# Patient Record
Sex: Female | Born: 1935
Health system: Southern US, Community
[De-identification: ages and names within clinical notes are randomized; demographics above are authoritative.]

## PROBLEM LIST (undated history)

## (undated) DIAGNOSIS — C801 Malignant (primary) neoplasm, unspecified: Secondary | ICD-10-CM

## (undated) DIAGNOSIS — R609 Edema, unspecified: Secondary | ICD-10-CM

## (undated) DIAGNOSIS — H269 Unspecified cataract: Secondary | ICD-10-CM

## (undated) DIAGNOSIS — Z91038 Other insect allergy status: Secondary | ICD-10-CM

## (undated) DIAGNOSIS — Z91030 Bee allergy status: Secondary | ICD-10-CM

## (undated) DIAGNOSIS — K219 Gastro-esophageal reflux disease without esophagitis: Secondary | ICD-10-CM

## (undated) HISTORY — DX: Bee allergy status: Z91.030

## (undated) HISTORY — PX: TONSILLECTOMY: SUR1361

## (undated) HISTORY — DX: Unspecified cataract: H26.9

## (undated) HISTORY — DX: Gastro-esophageal reflux disease without esophagitis: K21.9

## (undated) HISTORY — DX: Malignant (primary) neoplasm, unspecified: C80.1

## (undated) HISTORY — PX: EYE SURGERY: SHX253

## (undated) HISTORY — PX: APPENDECTOMY: SHX54

## (undated) HISTORY — DX: Other insect allergy status: Z91.038

## (undated) HISTORY — PX: ABDOMINAL HYSTERECTOMY: SHX81

## (undated) HISTORY — PX: NEPHROLITHOTOMY: SHX5134

## (undated) HISTORY — DX: Edema, unspecified: R60.9

## (undated) HISTORY — PX: ADENOIDECTOMY: SUR15

## (undated) HISTORY — PX: CHOLECYSTECTOMY: SHX55

---

## 2011-03-10 DIAGNOSIS — D649 Anemia, unspecified: Secondary | ICD-10-CM | POA: Diagnosis not present

## 2011-08-03 DIAGNOSIS — H43399 Other vitreous opacities, unspecified eye: Secondary | ICD-10-CM | POA: Diagnosis not present

## 2011-08-03 DIAGNOSIS — H524 Presbyopia: Secondary | ICD-10-CM | POA: Diagnosis not present

## 2011-08-03 DIAGNOSIS — H5200 Hypermetropia, unspecified eye: Secondary | ICD-10-CM | POA: Diagnosis not present

## 2011-08-03 DIAGNOSIS — H52229 Regular astigmatism, unspecified eye: Secondary | ICD-10-CM | POA: Diagnosis not present

## 2011-09-09 DIAGNOSIS — R0609 Other forms of dyspnea: Secondary | ICD-10-CM | POA: Diagnosis not present

## 2011-09-09 DIAGNOSIS — R5381 Other malaise: Secondary | ICD-10-CM | POA: Diagnosis not present

## 2011-09-09 DIAGNOSIS — R0989 Other specified symptoms and signs involving the circulatory and respiratory systems: Secondary | ICD-10-CM | POA: Diagnosis not present

## 2011-09-20 DIAGNOSIS — K5730 Diverticulosis of large intestine without perforation or abscess without bleeding: Secondary | ICD-10-CM | POA: Diagnosis not present

## 2011-09-20 DIAGNOSIS — D500 Iron deficiency anemia secondary to blood loss (chronic): Secondary | ICD-10-CM | POA: Diagnosis not present

## 2011-12-03 DIAGNOSIS — D649 Anemia, unspecified: Secondary | ICD-10-CM | POA: Diagnosis not present

## 2011-12-03 DIAGNOSIS — T7840XA Allergy, unspecified, initial encounter: Secondary | ICD-10-CM | POA: Diagnosis not present

## 2012-01-04 DIAGNOSIS — Z1231 Encounter for screening mammogram for malignant neoplasm of breast: Secondary | ICD-10-CM | POA: Diagnosis not present

## 2012-06-30 DIAGNOSIS — R609 Edema, unspecified: Secondary | ICD-10-CM | POA: Diagnosis not present

## 2012-06-30 DIAGNOSIS — R0609 Other forms of dyspnea: Secondary | ICD-10-CM | POA: Diagnosis not present

## 2012-06-30 DIAGNOSIS — Z79899 Other long term (current) drug therapy: Secondary | ICD-10-CM | POA: Diagnosis not present

## 2012-06-30 DIAGNOSIS — R5381 Other malaise: Secondary | ICD-10-CM | POA: Diagnosis not present

## 2012-06-30 DIAGNOSIS — R5383 Other fatigue: Secondary | ICD-10-CM | POA: Diagnosis not present

## 2012-06-30 DIAGNOSIS — R0602 Shortness of breath: Secondary | ICD-10-CM | POA: Diagnosis not present

## 2012-07-05 DIAGNOSIS — R918 Other nonspecific abnormal finding of lung field: Secondary | ICD-10-CM | POA: Diagnosis not present

## 2012-07-05 DIAGNOSIS — K449 Diaphragmatic hernia without obstruction or gangrene: Secondary | ICD-10-CM | POA: Diagnosis not present

## 2012-07-05 DIAGNOSIS — I77819 Aortic ectasia, unspecified site: Secondary | ICD-10-CM | POA: Diagnosis not present

## 2012-07-10 DIAGNOSIS — E7800 Pure hypercholesterolemia, unspecified: Secondary | ICD-10-CM | POA: Diagnosis not present

## 2012-07-10 DIAGNOSIS — E559 Vitamin D deficiency, unspecified: Secondary | ICD-10-CM | POA: Diagnosis not present

## 2012-07-10 DIAGNOSIS — D510 Vitamin B12 deficiency anemia due to intrinsic factor deficiency: Secondary | ICD-10-CM | POA: Diagnosis not present

## 2012-07-10 DIAGNOSIS — M949 Disorder of cartilage, unspecified: Secondary | ICD-10-CM | POA: Diagnosis not present

## 2012-07-10 DIAGNOSIS — D509 Iron deficiency anemia, unspecified: Secondary | ICD-10-CM | POA: Diagnosis not present

## 2012-07-10 DIAGNOSIS — M899 Disorder of bone, unspecified: Secondary | ICD-10-CM | POA: Diagnosis not present

## 2012-07-12 DIAGNOSIS — R1310 Dysphagia, unspecified: Secondary | ICD-10-CM | POA: Diagnosis not present

## 2012-07-13 DIAGNOSIS — D500 Iron deficiency anemia secondary to blood loss (chronic): Secondary | ICD-10-CM | POA: Diagnosis not present

## 2012-07-13 DIAGNOSIS — D649 Anemia, unspecified: Secondary | ICD-10-CM | POA: Diagnosis not present

## 2012-07-13 DIAGNOSIS — K222 Esophageal obstruction: Secondary | ICD-10-CM | POA: Diagnosis not present

## 2012-07-13 DIAGNOSIS — K219 Gastro-esophageal reflux disease without esophagitis: Secondary | ICD-10-CM | POA: Diagnosis not present

## 2012-07-13 DIAGNOSIS — K449 Diaphragmatic hernia without obstruction or gangrene: Secondary | ICD-10-CM | POA: Diagnosis not present

## 2012-07-13 DIAGNOSIS — K254 Chronic or unspecified gastric ulcer with hemorrhage: Secondary | ICD-10-CM | POA: Diagnosis not present

## 2012-07-18 DIAGNOSIS — D510 Vitamin B12 deficiency anemia due to intrinsic factor deficiency: Secondary | ICD-10-CM | POA: Diagnosis not present

## 2012-07-21 DIAGNOSIS — D649 Anemia, unspecified: Secondary | ICD-10-CM | POA: Diagnosis not present

## 2012-07-28 DIAGNOSIS — R0609 Other forms of dyspnea: Secondary | ICD-10-CM | POA: Diagnosis not present

## 2012-07-28 DIAGNOSIS — K279 Peptic ulcer, site unspecified, unspecified as acute or chronic, without hemorrhage or perforation: Secondary | ICD-10-CM | POA: Diagnosis not present

## 2012-07-28 DIAGNOSIS — R0989 Other specified symptoms and signs involving the circulatory and respiratory systems: Secondary | ICD-10-CM | POA: Diagnosis not present

## 2012-08-04 DIAGNOSIS — D649 Anemia, unspecified: Secondary | ICD-10-CM | POA: Diagnosis not present

## 2012-08-14 DIAGNOSIS — D510 Vitamin B12 deficiency anemia due to intrinsic factor deficiency: Secondary | ICD-10-CM | POA: Diagnosis not present

## 2012-08-14 DIAGNOSIS — R1310 Dysphagia, unspecified: Secondary | ICD-10-CM | POA: Diagnosis not present

## 2012-10-06 DIAGNOSIS — D649 Anemia, unspecified: Secondary | ICD-10-CM | POA: Diagnosis not present

## 2012-10-26 DIAGNOSIS — K219 Gastro-esophageal reflux disease without esophagitis: Secondary | ICD-10-CM | POA: Diagnosis not present

## 2012-10-26 DIAGNOSIS — D500 Iron deficiency anemia secondary to blood loss (chronic): Secondary | ICD-10-CM | POA: Diagnosis not present

## 2012-10-26 DIAGNOSIS — R1310 Dysphagia, unspecified: Secondary | ICD-10-CM | POA: Diagnosis not present

## 2012-10-26 DIAGNOSIS — K222 Esophageal obstruction: Secondary | ICD-10-CM | POA: Diagnosis not present

## 2012-10-26 DIAGNOSIS — D649 Anemia, unspecified: Secondary | ICD-10-CM | POA: Diagnosis not present

## 2012-11-10 DIAGNOSIS — D1330 Benign neoplasm of unspecified part of small intestine: Secondary | ICD-10-CM | POA: Diagnosis not present

## 2012-11-10 DIAGNOSIS — K449 Diaphragmatic hernia without obstruction or gangrene: Secondary | ICD-10-CM | POA: Diagnosis not present

## 2012-11-10 DIAGNOSIS — K253 Acute gastric ulcer without hemorrhage or perforation: Secondary | ICD-10-CM | POA: Diagnosis not present

## 2012-11-10 DIAGNOSIS — K250 Acute gastric ulcer with hemorrhage: Secondary | ICD-10-CM | POA: Diagnosis not present

## 2012-11-10 DIAGNOSIS — K222 Esophageal obstruction: Secondary | ICD-10-CM | POA: Diagnosis not present

## 2012-11-10 DIAGNOSIS — D509 Iron deficiency anemia, unspecified: Secondary | ICD-10-CM | POA: Diagnosis not present

## 2012-11-10 DIAGNOSIS — D508 Other iron deficiency anemias: Secondary | ICD-10-CM | POA: Diagnosis not present

## 2012-11-17 DIAGNOSIS — D649 Anemia, unspecified: Secondary | ICD-10-CM | POA: Diagnosis not present

## 2012-11-28 DIAGNOSIS — D508 Other iron deficiency anemias: Secondary | ICD-10-CM | POA: Diagnosis not present

## 2012-12-07 DIAGNOSIS — D649 Anemia, unspecified: Secondary | ICD-10-CM | POA: Diagnosis not present

## 2012-12-07 DIAGNOSIS — D500 Iron deficiency anemia secondary to blood loss (chronic): Secondary | ICD-10-CM | POA: Diagnosis not present

## 2012-12-14 DIAGNOSIS — D649 Anemia, unspecified: Secondary | ICD-10-CM | POA: Diagnosis not present

## 2012-12-26 DIAGNOSIS — D500 Iron deficiency anemia secondary to blood loss (chronic): Secondary | ICD-10-CM | POA: Diagnosis not present

## 2012-12-26 DIAGNOSIS — D649 Anemia, unspecified: Secondary | ICD-10-CM | POA: Diagnosis not present

## 2013-01-12 DIAGNOSIS — Z0000 Encounter for general adult medical examination without abnormal findings: Secondary | ICD-10-CM | POA: Diagnosis not present

## 2013-01-12 DIAGNOSIS — Z23 Encounter for immunization: Secondary | ICD-10-CM | POA: Diagnosis not present

## 2013-01-30 DIAGNOSIS — D649 Anemia, unspecified: Secondary | ICD-10-CM | POA: Diagnosis not present

## 2013-01-30 DIAGNOSIS — D500 Iron deficiency anemia secondary to blood loss (chronic): Secondary | ICD-10-CM | POA: Diagnosis not present

## 2013-02-19 DIAGNOSIS — D649 Anemia, unspecified: Secondary | ICD-10-CM | POA: Diagnosis not present

## 2013-03-07 DIAGNOSIS — K219 Gastro-esophageal reflux disease without esophagitis: Secondary | ICD-10-CM | POA: Diagnosis not present

## 2013-03-07 DIAGNOSIS — D500 Iron deficiency anemia secondary to blood loss (chronic): Secondary | ICD-10-CM | POA: Diagnosis not present

## 2013-04-30 DIAGNOSIS — H2589 Other age-related cataract: Secondary | ICD-10-CM | POA: Diagnosis not present

## 2013-05-01 DIAGNOSIS — D500 Iron deficiency anemia secondary to blood loss (chronic): Secondary | ICD-10-CM | POA: Diagnosis not present

## 2013-06-12 DIAGNOSIS — D649 Anemia, unspecified: Secondary | ICD-10-CM | POA: Diagnosis not present

## 2013-06-19 DIAGNOSIS — E11359 Type 2 diabetes mellitus with proliferative diabetic retinopathy without macular edema: Secondary | ICD-10-CM | POA: Diagnosis not present

## 2013-06-19 DIAGNOSIS — H2589 Other age-related cataract: Secondary | ICD-10-CM | POA: Diagnosis not present

## 2013-06-25 DIAGNOSIS — D500 Iron deficiency anemia secondary to blood loss (chronic): Secondary | ICD-10-CM | POA: Diagnosis not present

## 2013-07-05 DIAGNOSIS — I498 Other specified cardiac arrhythmias: Secondary | ICD-10-CM | POA: Diagnosis not present

## 2013-07-05 DIAGNOSIS — T782XXA Anaphylactic shock, unspecified, initial encounter: Secondary | ICD-10-CM | POA: Diagnosis not present

## 2013-07-05 DIAGNOSIS — I259 Chronic ischemic heart disease, unspecified: Secondary | ICD-10-CM | POA: Diagnosis not present

## 2013-07-05 DIAGNOSIS — R0602 Shortness of breath: Secondary | ICD-10-CM | POA: Diagnosis not present

## 2013-07-05 DIAGNOSIS — R404 Transient alteration of awareness: Secondary | ICD-10-CM | POA: Diagnosis not present

## 2013-07-05 DIAGNOSIS — T63461A Toxic effect of venom of wasps, accidental (unintentional), initial encounter: Secondary | ICD-10-CM | POA: Diagnosis not present

## 2013-07-06 DIAGNOSIS — I498 Other specified cardiac arrhythmias: Secondary | ICD-10-CM | POA: Diagnosis not present

## 2013-07-06 DIAGNOSIS — E876 Hypokalemia: Secondary | ICD-10-CM | POA: Diagnosis not present

## 2013-07-06 DIAGNOSIS — R0609 Other forms of dyspnea: Secondary | ICD-10-CM | POA: Diagnosis not present

## 2013-07-06 DIAGNOSIS — Z79899 Other long term (current) drug therapy: Secondary | ICD-10-CM | POA: Diagnosis not present

## 2013-07-06 DIAGNOSIS — T6391XA Toxic effect of contact with unspecified venomous animal, accidental (unintentional), initial encounter: Secondary | ICD-10-CM | POA: Diagnosis present

## 2013-07-06 DIAGNOSIS — T782XXA Anaphylactic shock, unspecified, initial encounter: Secondary | ICD-10-CM | POA: Diagnosis not present

## 2013-07-06 DIAGNOSIS — R748 Abnormal levels of other serum enzymes: Secondary | ICD-10-CM | POA: Diagnosis not present

## 2013-07-06 DIAGNOSIS — R0989 Other specified symptoms and signs involving the circulatory and respiratory systems: Secondary | ICD-10-CM | POA: Diagnosis not present

## 2013-07-06 DIAGNOSIS — I10 Essential (primary) hypertension: Secondary | ICD-10-CM | POA: Diagnosis present

## 2013-07-09 DIAGNOSIS — T6391XA Toxic effect of contact with unspecified venomous animal, accidental (unintentional), initial encounter: Secondary | ICD-10-CM | POA: Diagnosis not present

## 2013-07-11 DIAGNOSIS — T6391XA Toxic effect of contact with unspecified venomous animal, accidental (unintentional), initial encounter: Secondary | ICD-10-CM | POA: Diagnosis not present

## 2013-07-18 DIAGNOSIS — H2510 Age-related nuclear cataract, unspecified eye: Secondary | ICD-10-CM | POA: Diagnosis not present

## 2013-07-18 DIAGNOSIS — H2589 Other age-related cataract: Secondary | ICD-10-CM | POA: Diagnosis not present

## 2013-07-25 DIAGNOSIS — H5200 Hypermetropia, unspecified eye: Secondary | ICD-10-CM | POA: Diagnosis not present

## 2013-07-26 DIAGNOSIS — D500 Iron deficiency anemia secondary to blood loss (chronic): Secondary | ICD-10-CM | POA: Diagnosis not present

## 2013-08-22 DIAGNOSIS — T6391XA Toxic effect of contact with unspecified venomous animal, accidental (unintentional), initial encounter: Secondary | ICD-10-CM | POA: Diagnosis not present

## 2013-08-30 DIAGNOSIS — T6391XA Toxic effect of contact with unspecified venomous animal, accidental (unintentional), initial encounter: Secondary | ICD-10-CM | POA: Diagnosis not present

## 2013-09-06 DIAGNOSIS — T6391XA Toxic effect of contact with unspecified venomous animal, accidental (unintentional), initial encounter: Secondary | ICD-10-CM | POA: Diagnosis not present

## 2013-09-13 DIAGNOSIS — T6391XA Toxic effect of contact with unspecified venomous animal, accidental (unintentional), initial encounter: Secondary | ICD-10-CM | POA: Diagnosis not present

## 2013-09-20 DIAGNOSIS — T6391XA Toxic effect of contact with unspecified venomous animal, accidental (unintentional), initial encounter: Secondary | ICD-10-CM | POA: Diagnosis not present

## 2013-09-24 DIAGNOSIS — K5730 Diverticulosis of large intestine without perforation or abscess without bleeding: Secondary | ICD-10-CM | POA: Diagnosis not present

## 2013-09-24 DIAGNOSIS — D500 Iron deficiency anemia secondary to blood loss (chronic): Secondary | ICD-10-CM | POA: Diagnosis not present

## 2013-09-24 DIAGNOSIS — D518 Other vitamin B12 deficiency anemias: Secondary | ICD-10-CM | POA: Diagnosis not present

## 2013-09-24 DIAGNOSIS — D508 Other iron deficiency anemias: Secondary | ICD-10-CM | POA: Diagnosis not present

## 2013-09-27 DIAGNOSIS — T6391XA Toxic effect of contact with unspecified venomous animal, accidental (unintentional), initial encounter: Secondary | ICD-10-CM | POA: Diagnosis not present

## 2013-10-04 DIAGNOSIS — T6391XA Toxic effect of contact with unspecified venomous animal, accidental (unintentional), initial encounter: Secondary | ICD-10-CM | POA: Diagnosis not present

## 2013-10-11 DIAGNOSIS — T6391XA Toxic effect of contact with unspecified venomous animal, accidental (unintentional), initial encounter: Secondary | ICD-10-CM | POA: Diagnosis not present

## 2013-10-15 DIAGNOSIS — T6391XA Toxic effect of contact with unspecified venomous animal, accidental (unintentional), initial encounter: Secondary | ICD-10-CM | POA: Diagnosis not present

## 2013-10-22 DIAGNOSIS — T6391XA Toxic effect of contact with unspecified venomous animal, accidental (unintentional), initial encounter: Secondary | ICD-10-CM | POA: Diagnosis not present

## 2013-10-23 DIAGNOSIS — D500 Iron deficiency anemia secondary to blood loss (chronic): Secondary | ICD-10-CM | POA: Diagnosis not present

## 2013-10-23 DIAGNOSIS — D508 Other iron deficiency anemias: Secondary | ICD-10-CM | POA: Diagnosis not present

## 2013-10-23 DIAGNOSIS — D518 Other vitamin B12 deficiency anemias: Secondary | ICD-10-CM | POA: Diagnosis not present

## 2013-10-29 DIAGNOSIS — T6391XA Toxic effect of contact with unspecified venomous animal, accidental (unintentional), initial encounter: Secondary | ICD-10-CM | POA: Diagnosis not present

## 2013-11-08 DIAGNOSIS — T6391XA Toxic effect of contact with unspecified venomous animal, accidental (unintentional), initial encounter: Secondary | ICD-10-CM | POA: Diagnosis not present

## 2013-11-15 DIAGNOSIS — T6391XA Toxic effect of contact with unspecified venomous animal, accidental (unintentional), initial encounter: Secondary | ICD-10-CM | POA: Diagnosis not present

## 2013-11-19 DIAGNOSIS — T6391XA Toxic effect of contact with unspecified venomous animal, accidental (unintentional), initial encounter: Secondary | ICD-10-CM | POA: Diagnosis not present

## 2013-11-20 DIAGNOSIS — L2089 Other atopic dermatitis: Secondary | ICD-10-CM | POA: Diagnosis not present

## 2013-11-20 DIAGNOSIS — C44529 Squamous cell carcinoma of skin of other part of trunk: Secondary | ICD-10-CM | POA: Diagnosis not present

## 2013-11-28 ENCOUNTER — Ambulatory Visit (INDEPENDENT_AMBULATORY_CARE_PROVIDER_SITE_OTHER): Payer: Medicare Other

## 2013-11-28 VITALS — BP 151/87 | HR 75 | Resp 18

## 2013-11-28 DIAGNOSIS — D500 Iron deficiency anemia secondary to blood loss (chronic): Secondary | ICD-10-CM | POA: Diagnosis not present

## 2013-11-28 DIAGNOSIS — R52 Pain, unspecified: Secondary | ICD-10-CM

## 2013-11-28 DIAGNOSIS — M79609 Pain in unspecified limb: Secondary | ICD-10-CM | POA: Diagnosis not present

## 2013-11-28 DIAGNOSIS — M2040 Other hammer toe(s) (acquired), unspecified foot: Secondary | ICD-10-CM | POA: Diagnosis not present

## 2013-11-28 DIAGNOSIS — B351 Tinea unguium: Secondary | ICD-10-CM

## 2013-11-28 DIAGNOSIS — D508 Other iron deficiency anemias: Secondary | ICD-10-CM | POA: Diagnosis not present

## 2013-11-28 DIAGNOSIS — M79673 Pain in unspecified foot: Secondary | ICD-10-CM

## 2013-11-28 NOTE — Progress Notes (Signed)
   Subjective:    Patient ID: Alexis Stephens, female    DOB: 10-15-1935, 78 y.o.   MRN: [id number]  HPI I HAVE SOME HAMMERTOES ON MY RIGHT FOOT THAT HURT AND THE 2ND AND 3RD AND THE 3RD TOE IS HURTING ME AND HAS BEEN GOING ON FOR A YEAR AND THEY DID GET SORE AND TENDER AND I FEEL LIKE I AM WALKING ON MY TOENAILS AND MY TWO BIG TOENAILS ARE THICK AND DISCOLORED AND HAS BEEN GOING ON FOR ABOUT 2 YEARS AND I HAVE USED V[facility] AND I CAN NOT CUT MY TOENAILS   Review of Systems  HENT: Positive for hearing loss.        RINGING IN EARS   Musculoskeletal:       JOINT PAIN   Skin:       CHANGE IN NAILS  Allergic/Immunologic: Positive for environmental allergies and food allergies.       PEANUTS AND MIXED VESPIDS   All other systems reviewed and are negative.      Objective:   Physical Exam 78 year old white female well-developed well-nourished oriented x3 presents at this time with painful thick fungus nail she's tried treating with topicals in the past however this time the distal one thirds of the nail in nails hallux third and fifth digits bilateral show some yellowing and thickening discoloration also has some pain and some rigidity of the third toe right foot with some displacement of the toe. May have early hammertoe deformity.  Lower extremity objective findings revealed vascular status to be intact pedal pulses palpable DP and PT +2/4 bilateral capillary refill time 3 seconds epicritic and proprioceptive sensations intact and symmetric bilateral. There is normal plantar response DTRs not listed neurologically skin color pigment normal hair growth absent there is thickening dystrophic probably distal tuft of the nails of the hallux and third digits bilateral tenderness and pain both on palpation for shoe wear and ambulation. Consistent with early onychomycosis patient also has distal keratoses third digit right with semirigid contractures of toes orthopedic exam patient x-rays reveal mild  osteopenic changes rectus hallux hammertoe deformities 2 through 5 with 80 slightly plantigrade third toe at the IP joint slight malleus type or or claw-type deformity. Under with activities ambulation and may be aggravated more certain shoes did recommend a longer more accommodative shoe in the future. Patient unable to cut her nails because of thickness discoloration dystrophy of the nails and tenderness request debridement at this time at this time the third digit right is within 2 foam pad maintain appropriate accommodative shoes if thick and hammertoe continues to be an issue may be candidate for more invasive or surgical options in the future.       Assessment & Plan:  Assessment this time hammertoe deformity third right will accommodate with tube foam padding cushion in more accommodative shoes. Also tinea pedis is not noted however onychomycosis of nails 13 and 5 bilateral wound was dressed with topical nail antifungal such as Fungi-Nail or fungoid tincture which can be obtained over-the-counter in the future symptoms persist or fail to improve may consider prescription antifungal as or possibly the surgical intervention for hammertoe repair in the future if needed next. Debrided painful mycotic nails 1 through 5 bilateral at this time return for palliative care is needed  Harriet Masson DPM

## 2013-11-28 NOTE — Patient Instructions (Signed)
Onychomycosis/Fungal Toenails  WHAT IS IT? An infection that lies within the keratin of your nail plate that is caused by a fungus.  WHY ME? Fungal infections affect all ages, sexes, races, and creeds.  There may be many factors that predispose you to a fungal infection such as age, coexisting medical conditions such as diabetes, or an autoimmune disease; stress, medications, fatigue, genetics, etc.  Bottom line: fungus thrives in a warm, moist environment and your shoes offer such a location.  IS IT CONTAGIOUS? Theoretically, yes.  You do not want to share shoes, nail clippers or files with someone who has fungal toenails.  Walking around barefoot in the same room or sleeping in the same bed is unlikely to transfer the organism.  It is important to realize, however, that fungus can spread easily from one nail to the next on the same foot.  HOW DO WE TREAT THIS?  There are several ways to treat this condition.  Treatment may depend on many factors such as age, medications, pregnancy, liver and kidney conditions, etc.  It is best to ask your doctor which options are available to you.  1. No treatment.   Unlike many other medical concerns, you can live with this condition.  However for many people this can be a painful condition and may lead to ingrown toenails or a bacterial infection.  It is recommended that you keep the nails cut short to help reduce the amount of fungal nail. 2. Topical treatment.  These range from herbal remedies to prescription strength nail lacquers.  About 40-50% effective, topicals require twice daily application for approximately 9 to 12 months or until an entirely new nail has grown out.  The most effective topicals are medical grade medications available through physicians offices. 3. Oral antifungal medications.  With an 80-90% cure rate, the most common oral medication requires 3 to 4 months of therapy and stays in your system for a year as the new nail grows out.  Oral  antifungal medications do require blood work to make sure it is a safe drug for you.  A liver function panel will be performed prior to starting the medication and after the first month of treatment.  It is important to have the blood work performed to avoid any harmful side effects.  In general, this medication safe but blood work is required. 4. Laser Therapy.  This treatment is performed by applying a specialized laser to the affected nail plate.  This therapy is noninvasive, fast, and non-painful.  It is not covered by insurance and is therefore, out of pocket.  The results have been very good with a 80-95% cure rate.  The [facility] is the only practice in the area to offer this therapy. 5. Permanent Nail Avulsion.  Removing the entire nail so that a new nail will not grow back.   Recommendations to obtain Fungi-Nail over-the-counter at any pharmacy without the need of prescription. Apply once or twice daily to the affected toenails for 12 months duration minimum

## 2013-11-29 DIAGNOSIS — T63441D Toxic effect of venom of bees, accidental (unintentional), subsequent encounter: Secondary | ICD-10-CM | POA: Diagnosis not present

## 2013-12-03 DIAGNOSIS — T63441D Toxic effect of venom of bees, accidental (unintentional), subsequent encounter: Secondary | ICD-10-CM | POA: Diagnosis not present

## 2013-12-13 DIAGNOSIS — T63441D Toxic effect of venom of bees, accidental (unintentional), subsequent encounter: Secondary | ICD-10-CM | POA: Diagnosis not present

## 2013-12-20 DIAGNOSIS — T63441D Toxic effect of venom of bees, accidental (unintentional), subsequent encounter: Secondary | ICD-10-CM | POA: Diagnosis not present

## 2013-12-25 DIAGNOSIS — D508 Other iron deficiency anemias: Secondary | ICD-10-CM | POA: Diagnosis not present

## 2013-12-25 DIAGNOSIS — D500 Iron deficiency anemia secondary to blood loss (chronic): Secondary | ICD-10-CM | POA: Diagnosis not present

## 2013-12-27 DIAGNOSIS — T63441D Toxic effect of venom of bees, accidental (unintentional), subsequent encounter: Secondary | ICD-10-CM | POA: Diagnosis not present

## 2014-01-03 DIAGNOSIS — T63441D Toxic effect of venom of bees, accidental (unintentional), subsequent encounter: Secondary | ICD-10-CM | POA: Diagnosis not present

## 2014-01-10 DIAGNOSIS — T63441D Toxic effect of venom of bees, accidental (unintentional), subsequent encounter: Secondary | ICD-10-CM | POA: Diagnosis not present

## 2014-01-11 DIAGNOSIS — Z23 Encounter for immunization: Secondary | ICD-10-CM | POA: Diagnosis not present

## 2014-01-15 DIAGNOSIS — D508 Other iron deficiency anemias: Secondary | ICD-10-CM | POA: Diagnosis not present

## 2014-01-15 DIAGNOSIS — D500 Iron deficiency anemia secondary to blood loss (chronic): Secondary | ICD-10-CM | POA: Diagnosis not present

## 2014-01-17 DIAGNOSIS — T63441D Toxic effect of venom of bees, accidental (unintentional), subsequent encounter: Secondary | ICD-10-CM | POA: Diagnosis not present

## 2014-01-21 DIAGNOSIS — T63441D Toxic effect of venom of bees, accidental (unintentional), subsequent encounter: Secondary | ICD-10-CM | POA: Diagnosis not present

## 2014-02-04 DIAGNOSIS — T63441D Toxic effect of venom of bees, accidental (unintentional), subsequent encounter: Secondary | ICD-10-CM | POA: Diagnosis not present

## 2014-02-11 DIAGNOSIS — T63441D Toxic effect of venom of bees, accidental (unintentional), subsequent encounter: Secondary | ICD-10-CM | POA: Diagnosis not present

## 2014-02-12 DIAGNOSIS — D500 Iron deficiency anemia secondary to blood loss (chronic): Secondary | ICD-10-CM | POA: Diagnosis not present

## 2014-02-12 DIAGNOSIS — D508 Other iron deficiency anemias: Secondary | ICD-10-CM | POA: Diagnosis not present

## 2014-02-28 DIAGNOSIS — T63441D Toxic effect of venom of bees, accidental (unintentional), subsequent encounter: Secondary | ICD-10-CM | POA: Diagnosis not present

## 2014-03-07 DIAGNOSIS — T63441D Toxic effect of venom of bees, accidental (unintentional), subsequent encounter: Secondary | ICD-10-CM | POA: Diagnosis not present

## 2014-03-18 DIAGNOSIS — T63441D Toxic effect of venom of bees, accidental (unintentional), subsequent encounter: Secondary | ICD-10-CM | POA: Diagnosis not present

## 2014-03-26 DIAGNOSIS — D500 Iron deficiency anemia secondary to blood loss (chronic): Secondary | ICD-10-CM | POA: Diagnosis not present

## 2014-03-28 DIAGNOSIS — T63441D Toxic effect of venom of bees, accidental (unintentional), subsequent encounter: Secondary | ICD-10-CM | POA: Diagnosis not present

## 2014-04-02 DIAGNOSIS — K649 Unspecified hemorrhoids: Secondary | ICD-10-CM | POA: Diagnosis not present

## 2014-04-04 DIAGNOSIS — T63441D Toxic effect of venom of bees, accidental (unintentional), subsequent encounter: Secondary | ICD-10-CM | POA: Diagnosis not present

## 2014-04-10 DIAGNOSIS — T63441D Toxic effect of venom of bees, accidental (unintentional), subsequent encounter: Secondary | ICD-10-CM | POA: Diagnosis not present

## 2014-04-11 DIAGNOSIS — T63441D Toxic effect of venom of bees, accidental (unintentional), subsequent encounter: Secondary | ICD-10-CM | POA: Diagnosis not present

## 2014-04-18 DIAGNOSIS — T63441D Toxic effect of venom of bees, accidental (unintentional), subsequent encounter: Secondary | ICD-10-CM | POA: Diagnosis not present

## 2014-04-25 DIAGNOSIS — T63441D Toxic effect of venom of bees, accidental (unintentional), subsequent encounter: Secondary | ICD-10-CM | POA: Diagnosis not present

## 2014-05-09 DIAGNOSIS — T63441D Toxic effect of venom of bees, accidental (unintentional), subsequent encounter: Secondary | ICD-10-CM | POA: Diagnosis not present

## 2014-05-30 DIAGNOSIS — T63441D Toxic effect of venom of bees, accidental (unintentional), subsequent encounter: Secondary | ICD-10-CM | POA: Diagnosis not present

## 2014-06-04 DIAGNOSIS — D500 Iron deficiency anemia secondary to blood loss (chronic): Secondary | ICD-10-CM | POA: Diagnosis not present

## 2014-06-10 DIAGNOSIS — T63441D Toxic effect of venom of bees, accidental (unintentional), subsequent encounter: Secondary | ICD-10-CM | POA: Diagnosis not present

## 2014-06-27 DIAGNOSIS — T63441D Toxic effect of venom of bees, accidental (unintentional), subsequent encounter: Secondary | ICD-10-CM | POA: Diagnosis not present

## 2014-07-02 DIAGNOSIS — D500 Iron deficiency anemia secondary to blood loss (chronic): Secondary | ICD-10-CM | POA: Diagnosis not present

## 2014-07-25 DIAGNOSIS — T63441D Toxic effect of venom of bees, accidental (unintentional), subsequent encounter: Secondary | ICD-10-CM | POA: Diagnosis not present

## 2014-08-06 DIAGNOSIS — D500 Iron deficiency anemia secondary to blood loss (chronic): Secondary | ICD-10-CM | POA: Diagnosis not present

## 2014-08-29 DIAGNOSIS — T63441D Toxic effect of venom of bees, accidental (unintentional), subsequent encounter: Secondary | ICD-10-CM | POA: Diagnosis not present

## 2014-09-03 DIAGNOSIS — D500 Iron deficiency anemia secondary to blood loss (chronic): Secondary | ICD-10-CM | POA: Diagnosis not present

## 2014-09-30 DIAGNOSIS — T63441D Toxic effect of venom of bees, accidental (unintentional), subsequent encounter: Secondary | ICD-10-CM | POA: Diagnosis not present

## 2014-10-08 DIAGNOSIS — D500 Iron deficiency anemia secondary to blood loss (chronic): Secondary | ICD-10-CM | POA: Diagnosis not present

## 2014-10-21 DIAGNOSIS — D649 Anemia, unspecified: Secondary | ICD-10-CM | POA: Diagnosis not present

## 2014-10-21 DIAGNOSIS — D509 Iron deficiency anemia, unspecified: Secondary | ICD-10-CM | POA: Diagnosis not present

## 2014-10-24 DIAGNOSIS — T63441D Toxic effect of venom of bees, accidental (unintentional), subsequent encounter: Secondary | ICD-10-CM | POA: Diagnosis not present

## 2014-10-30 DIAGNOSIS — T782XXA Anaphylactic shock, unspecified, initial encounter: Secondary | ICD-10-CM

## 2014-10-30 DIAGNOSIS — T63481A Toxic effect of venom of other arthropod, accidental (unintentional), initial encounter: Secondary | ICD-10-CM

## 2014-10-30 HISTORY — DX: Anaphylactic shock, unspecified, initial encounter: T78.2XXA

## 2014-10-30 HISTORY — DX: Toxic effect of venom of other arthropod, accidental (unintentional), initial encounter: T63.481A

## 2014-11-12 DIAGNOSIS — D500 Iron deficiency anemia secondary to blood loss (chronic): Secondary | ICD-10-CM | POA: Diagnosis not present

## 2014-11-25 DIAGNOSIS — T63441D Toxic effect of venom of bees, accidental (unintentional), subsequent encounter: Secondary | ICD-10-CM | POA: Diagnosis not present

## 2014-12-17 DIAGNOSIS — D500 Iron deficiency anemia secondary to blood loss (chronic): Secondary | ICD-10-CM | POA: Diagnosis not present

## 2014-12-24 DIAGNOSIS — T63441A Toxic effect of venom of bees, accidental (unintentional), initial encounter: Secondary | ICD-10-CM | POA: Diagnosis not present

## 2014-12-25 DIAGNOSIS — T63441A Toxic effect of venom of bees, accidental (unintentional), initial encounter: Secondary | ICD-10-CM | POA: Diagnosis not present

## 2014-12-26 ENCOUNTER — Ambulatory Visit (INDEPENDENT_AMBULATORY_CARE_PROVIDER_SITE_OTHER): Payer: Medicare Other

## 2014-12-26 DIAGNOSIS — T63484D Toxic effect of venom of other arthropod, undetermined, subsequent encounter: Secondary | ICD-10-CM

## 2014-12-26 DIAGNOSIS — T782XXD Anaphylactic shock, unspecified, subsequent encounter: Secondary | ICD-10-CM | POA: Diagnosis not present

## 2015-01-06 DIAGNOSIS — H25812 Combined forms of age-related cataract, left eye: Secondary | ICD-10-CM | POA: Diagnosis not present

## 2015-01-16 ENCOUNTER — Ambulatory Visit (INDEPENDENT_AMBULATORY_CARE_PROVIDER_SITE_OTHER): Payer: Medicare Other | Admitting: *Deleted

## 2015-01-16 DIAGNOSIS — T782XXD Anaphylactic shock, unspecified, subsequent encounter: Secondary | ICD-10-CM | POA: Diagnosis not present

## 2015-01-16 DIAGNOSIS — T63481D Toxic effect of venom of other arthropod, accidental (unintentional), subsequent encounter: Secondary | ICD-10-CM

## 2015-01-21 DIAGNOSIS — D500 Iron deficiency anemia secondary to blood loss (chronic): Secondary | ICD-10-CM | POA: Diagnosis not present

## 2015-01-22 DIAGNOSIS — H2512 Age-related nuclear cataract, left eye: Secondary | ICD-10-CM | POA: Diagnosis not present

## 2015-02-03 DIAGNOSIS — T161XXA Foreign body in right ear, initial encounter: Secondary | ICD-10-CM | POA: Diagnosis not present

## 2015-02-05 DIAGNOSIS — Z23 Encounter for immunization: Secondary | ICD-10-CM | POA: Diagnosis not present

## 2015-02-11 DIAGNOSIS — I10 Essential (primary) hypertension: Secondary | ICD-10-CM | POA: Diagnosis not present

## 2015-02-11 DIAGNOSIS — Z87891 Personal history of nicotine dependence: Secondary | ICD-10-CM | POA: Diagnosis not present

## 2015-02-11 DIAGNOSIS — H2512 Age-related nuclear cataract, left eye: Secondary | ICD-10-CM | POA: Diagnosis not present

## 2015-02-18 DIAGNOSIS — D500 Iron deficiency anemia secondary to blood loss (chronic): Secondary | ICD-10-CM | POA: Diagnosis not present

## 2015-02-20 ENCOUNTER — Ambulatory Visit (INDEPENDENT_AMBULATORY_CARE_PROVIDER_SITE_OTHER): Payer: Medicare Other | Admitting: *Deleted

## 2015-02-20 DIAGNOSIS — T782XXD Anaphylactic shock, unspecified, subsequent encounter: Secondary | ICD-10-CM | POA: Diagnosis not present

## 2015-02-20 DIAGNOSIS — T63481D Toxic effect of venom of other arthropod, accidental (unintentional), subsequent encounter: Secondary | ICD-10-CM | POA: Diagnosis not present

## 2015-03-07 DIAGNOSIS — B029 Zoster without complications: Secondary | ICD-10-CM | POA: Diagnosis not present

## 2015-03-25 DIAGNOSIS — D500 Iron deficiency anemia secondary to blood loss (chronic): Secondary | ICD-10-CM | POA: Diagnosis not present

## 2015-03-27 ENCOUNTER — Ambulatory Visit (INDEPENDENT_AMBULATORY_CARE_PROVIDER_SITE_OTHER): Payer: Medicare Other

## 2015-03-27 DIAGNOSIS — T782XXD Anaphylactic shock, unspecified, subsequent encounter: Secondary | ICD-10-CM

## 2015-03-27 DIAGNOSIS — T63481D Toxic effect of venom of other arthropod, accidental (unintentional), subsequent encounter: Secondary | ICD-10-CM | POA: Diagnosis not present

## 2015-04-22 DIAGNOSIS — D500 Iron deficiency anemia secondary to blood loss (chronic): Secondary | ICD-10-CM | POA: Diagnosis not present

## 2015-04-24 ENCOUNTER — Ambulatory Visit (INDEPENDENT_AMBULATORY_CARE_PROVIDER_SITE_OTHER): Payer: Medicare Other | Admitting: *Deleted

## 2015-04-24 DIAGNOSIS — T63441D Toxic effect of venom of bees, accidental (unintentional), subsequent encounter: Secondary | ICD-10-CM | POA: Diagnosis not present

## 2015-04-24 DIAGNOSIS — IMO0001 Reserved for inherently not codable concepts without codable children: Secondary | ICD-10-CM

## 2015-05-20 DIAGNOSIS — D500 Iron deficiency anemia secondary to blood loss (chronic): Secondary | ICD-10-CM | POA: Diagnosis not present

## 2015-05-22 ENCOUNTER — Ambulatory Visit (INDEPENDENT_AMBULATORY_CARE_PROVIDER_SITE_OTHER): Payer: Medicare Other | Admitting: *Deleted

## 2015-05-22 DIAGNOSIS — IMO0001 Reserved for inherently not codable concepts without codable children: Secondary | ICD-10-CM

## 2015-05-22 DIAGNOSIS — T63441D Toxic effect of venom of bees, accidental (unintentional), subsequent encounter: Secondary | ICD-10-CM

## 2015-06-17 DIAGNOSIS — D500 Iron deficiency anemia secondary to blood loss (chronic): Secondary | ICD-10-CM | POA: Diagnosis not present

## 2015-06-19 ENCOUNTER — Ambulatory Visit (INDEPENDENT_AMBULATORY_CARE_PROVIDER_SITE_OTHER): Payer: Medicare Other | Admitting: *Deleted

## 2015-06-19 DIAGNOSIS — T63441D Toxic effect of venom of bees, accidental (unintentional), subsequent encounter: Secondary | ICD-10-CM

## 2015-06-19 DIAGNOSIS — IMO0001 Reserved for inherently not codable concepts without codable children: Secondary | ICD-10-CM

## 2015-07-14 ENCOUNTER — Ambulatory Visit (INDEPENDENT_AMBULATORY_CARE_PROVIDER_SITE_OTHER): Payer: Medicare Other | Admitting: *Deleted

## 2015-07-14 DIAGNOSIS — T63441D Toxic effect of venom of bees, accidental (unintentional), subsequent encounter: Secondary | ICD-10-CM

## 2015-07-14 DIAGNOSIS — IMO0001 Reserved for inherently not codable concepts without codable children: Secondary | ICD-10-CM

## 2015-07-18 DIAGNOSIS — Z1389 Encounter for screening for other disorder: Secondary | ICD-10-CM | POA: Diagnosis not present

## 2015-07-18 DIAGNOSIS — Z0000 Encounter for general adult medical examination without abnormal findings: Secondary | ICD-10-CM | POA: Diagnosis not present

## 2015-07-18 DIAGNOSIS — R0602 Shortness of breath: Secondary | ICD-10-CM | POA: Diagnosis not present

## 2015-07-18 DIAGNOSIS — K449 Diaphragmatic hernia without obstruction or gangrene: Secondary | ICD-10-CM | POA: Diagnosis not present

## 2015-07-18 DIAGNOSIS — Z9181 History of falling: Secondary | ICD-10-CM | POA: Diagnosis not present

## 2015-07-22 DIAGNOSIS — D500 Iron deficiency anemia secondary to blood loss (chronic): Secondary | ICD-10-CM | POA: Diagnosis not present

## 2015-07-23 DIAGNOSIS — D649 Anemia, unspecified: Secondary | ICD-10-CM | POA: Diagnosis not present

## 2015-07-23 DIAGNOSIS — R0602 Shortness of breath: Secondary | ICD-10-CM | POA: Diagnosis not present

## 2015-07-23 DIAGNOSIS — N289 Disorder of kidney and ureter, unspecified: Secondary | ICD-10-CM | POA: Diagnosis not present

## 2015-07-23 DIAGNOSIS — Z79899 Other long term (current) drug therapy: Secondary | ICD-10-CM | POA: Diagnosis not present

## 2015-08-21 DIAGNOSIS — M5137 Other intervertebral disc degeneration, lumbosacral region: Secondary | ICD-10-CM | POA: Diagnosis not present

## 2015-08-21 DIAGNOSIS — M9904 Segmental and somatic dysfunction of sacral region: Secondary | ICD-10-CM | POA: Diagnosis not present

## 2015-08-21 DIAGNOSIS — M5441 Lumbago with sciatica, right side: Secondary | ICD-10-CM | POA: Diagnosis not present

## 2015-08-21 DIAGNOSIS — M5136 Other intervertebral disc degeneration, lumbar region: Secondary | ICD-10-CM | POA: Diagnosis not present

## 2015-08-21 DIAGNOSIS — M9903 Segmental and somatic dysfunction of lumbar region: Secondary | ICD-10-CM | POA: Diagnosis not present

## 2015-08-22 DIAGNOSIS — M9904 Segmental and somatic dysfunction of sacral region: Secondary | ICD-10-CM | POA: Diagnosis not present

## 2015-08-22 DIAGNOSIS — M9903 Segmental and somatic dysfunction of lumbar region: Secondary | ICD-10-CM | POA: Diagnosis not present

## 2015-08-22 DIAGNOSIS — M5137 Other intervertebral disc degeneration, lumbosacral region: Secondary | ICD-10-CM | POA: Diagnosis not present

## 2015-08-22 DIAGNOSIS — M5136 Other intervertebral disc degeneration, lumbar region: Secondary | ICD-10-CM | POA: Diagnosis not present

## 2015-08-22 DIAGNOSIS — M5441 Lumbago with sciatica, right side: Secondary | ICD-10-CM | POA: Diagnosis not present

## 2015-08-25 ENCOUNTER — Ambulatory Visit (INDEPENDENT_AMBULATORY_CARE_PROVIDER_SITE_OTHER): Payer: Medicare Other | Admitting: Allergy and Immunology

## 2015-08-25 ENCOUNTER — Encounter: Payer: Self-pay | Admitting: Allergy and Immunology

## 2015-08-25 VITALS — BP 122/80 | HR 100 | Resp 18 | Ht 61.14 in | Wt 189.4 lb

## 2015-08-25 DIAGNOSIS — R0609 Other forms of dyspnea: Secondary | ICD-10-CM

## 2015-08-25 DIAGNOSIS — R000 Tachycardia, unspecified: Secondary | ICD-10-CM

## 2015-08-25 DIAGNOSIS — Z91030 Bee allergy status: Secondary | ICD-10-CM

## 2015-08-25 DIAGNOSIS — Z91038 Other insect allergy status: Secondary | ICD-10-CM | POA: Diagnosis not present

## 2015-08-25 NOTE — Patient Instructions (Signed)
  1. Continue immunotherapy  2. Continue EpiPen, Benadryl, M.D./ER for allergic reaction  3. Slowly taper off caffeine use  4. Slowly undergo weight reduction  5. Slowly engage in progressive exercise routine  6. May need further evaluation for heart function and rhythm  7. Obtain fall flu vaccine  8. Return to clinic in 1 year or earlier if problem

## 2015-08-25 NOTE — Progress Notes (Signed)
Follow-up Note  Referring Provider: [name], MD Primary Provider: [name]., MD Date of Office Visit: 08/25/2015  Subjective:   Alexis Stephens (DOB: 12-Sep-1935) is a 80 y.o. female who returns to the [facility] on 08/25/2015 in re-evaluation of the following:  HPI: Alexis Stephens presents to this clinic in evaluation of her Hymenoptera venom hypersensitivity state treated with immunotherapy. She is presently receiving immunotherapy directed against mixed vespid and wasp every 4 weeks. She initiated this therapy approximately 2 years ago. She has not had any adverse effects secondary to this form of treatment. She is not had any field stings.  Alexis Stephens also relates a history of developing shortness of breath over the course of the past several months. The shortness of breath is only related to the performance of exercise. She never has any resting shortness of breath. Her recovery time is less than 1 minute. Dr. Lin Landsman gave her a short acting bronchodilator which has not helped her issue at all. Apparently Dr. Lin Landsman obtained an EKG and a chest x-ray and blood tests which have been normal. She has noticed that her heart rate has gone up recently and she has gained approximately 18 pounds of weight in the past year. She drinks 3 caffeinated drinks per day.    Medication List           EPIPEN 2-PAK 0.3 mg/0.3 mL Soaj injection  Generic drug:  EPINEPHrine  Inject 0.3 mg into the muscle once.     PROAIR RESPICLICK 123XX123 (90 Base) MCG/ACT Aepb  Generic drug:  Albuterol Sulfate  inhale 2 puffs by mouth three times a day if needed        Past Medical History  Diagnosis Date  . Cataract   . Swelling   . GERD (gastroesophageal reflux disease)   . Hymenoptera allergy     Past Surgical History  Procedure Laterality Date  . Eye surgery    . Abdominal hysterectomy    . Appendectomy    . Cholecystectomy    . Nephrolithotomy Left   . Adenoidectomy    .  Tonsillectomy      Allergies  Allergen Reactions  . Ivp Dye [Iodinated Diagnostic Agents] Other (See Comments)    CARDIAC ARREST  . Mixed Vespid Venom Anaphylaxis  . Wasp Venom Anaphylaxis  . Furadantin [Nitrofurantoin] Hives and Nausea Only  . Ibuprofen Hives  . Iodine   . Penicillins   . Sulfa Antibiotics     Review of systems negative except as noted in HPI / PMHx or noted below:  Review of Systems  Constitutional: Negative.   HENT: Negative.   Eyes: Negative.   Respiratory: Negative.   Cardiovascular: Negative.   Gastrointestinal: Negative.   Genitourinary: Negative.   Musculoskeletal: Negative.   Skin: Negative.   Neurological: Negative.   Endo/Heme/Allergies: Negative.   Psychiatric/Behavioral: Negative.      Objective:   Filed Vitals:   08/25/15 1402  BP: 122/80  Pulse: 100  Resp: 18   Height: 5' 1.14" (155.3 cm)  Weight: 189 lb 6 oz (85.9 kg)   Physical Exam  Constitutional: She is well-developed, well-nourished, and in no distress.  HENT:  Head: Normocephalic.  Right Ear: Tympanic membrane, external ear and ear canal normal.  Left Ear: Tympanic membrane, external ear and ear canal normal.  Nose: Nose normal. No mucosal edema or rhinorrhea.  Mouth/Throat: Uvula is midline, oropharynx is clear and moist and mucous membranes are normal. No oropharyngeal exudate.  Eyes: Conjunctivae are normal.  Neck: Trachea normal. No tracheal tenderness present. No tracheal deviation present. No thyromegaly present.  Cardiovascular: Regular rhythm, S1 normal, S2 normal and normal heart sounds.   No murmur heard. Pulmonary/Chest: Breath sounds normal. No stridor. No respiratory distress. She has no wheezes. She has no rales.  Musculoskeletal: She exhibits no edema.  Lymphadenopathy:       Head (right side): No tonsillar adenopathy present.       Head (left side): No tonsillar adenopathy present.    She has no cervical adenopathy.  Neurological: She is alert. Gait  normal.  Skin: No rash noted. She is not diaphoretic. No erythema. Nails show no clubbing.  Psychiatric: Mood and affect normal.    Diagnostics:    Spirometry was performed and demonstrated an FEV1 of 1.41 at 82 % of predicted.    Assessment and Plan:   1. Hymenoptera allergy   2. Dyspnea on exertion   3. Tachycardia     1. Continue immunotherapyAgainst Hymenoptera venom including mixed vespid and wasp  2. Continue EpiPen, Benadryl, M.D./ER for allergic reaction  3. Slowly taper off caffeine use  4. Slowly undergo weight reduction  5. Slowly engage in progressive exercise routine  6. May need further evaluation for heart function and rhythm  7. Obtain fall flu vaccine  8. Return to clinic in 1 year or earlier if problem  Alexis Stephens has done well with her immunotherapy directed against Hymenoptera venom and we will continue to have her use this form of treatment for life. Her initial reaction was quite severe and the recommendation for that type of reaction is to continue immunotherapy continuously. She may get out every 6-8 weeks over the course the next several years. The etiology of her shortness of breath is unknown. There is very little evidence that she has a bronchospastic disease. Apparently her EKG and chest x-ray and blood tests looking for anemia have been okay. This may all be cardiac deconditioning especially given her 18 pound gain of weight and the fact that she has resting tachycardia. It did appear as though her rhythm was relatively normal during today's evaluation and I'll hold off on any additional evaluation for atrial fibrillation or other tachycardia syndromes. We'll see what happens as she tapers off her caffeine and starts to undergo weight reduction and engages in a progressive exercise routine. She has a repeat appointment to see Dr. Lin Landsman at the end of July for a full physical and she can follow-up with him at that point time concerning further status about  her dyspnea on exertion. Certainly if there is a significant issue that evolves before then she'll seek out further care prior to that visit.  Allena Katz, MD Alexis Stephens

## 2015-08-26 DIAGNOSIS — D500 Iron deficiency anemia secondary to blood loss (chronic): Secondary | ICD-10-CM | POA: Diagnosis not present

## 2015-08-26 DIAGNOSIS — M5137 Other intervertebral disc degeneration, lumbosacral region: Secondary | ICD-10-CM | POA: Diagnosis not present

## 2015-08-26 DIAGNOSIS — M5441 Lumbago with sciatica, right side: Secondary | ICD-10-CM | POA: Diagnosis not present

## 2015-08-26 DIAGNOSIS — M9903 Segmental and somatic dysfunction of lumbar region: Secondary | ICD-10-CM | POA: Diagnosis not present

## 2015-08-26 DIAGNOSIS — M9904 Segmental and somatic dysfunction of sacral region: Secondary | ICD-10-CM | POA: Diagnosis not present

## 2015-08-26 DIAGNOSIS — M9902 Segmental and somatic dysfunction of thoracic region: Secondary | ICD-10-CM | POA: Diagnosis not present

## 2015-08-26 DIAGNOSIS — M5136 Other intervertebral disc degeneration, lumbar region: Secondary | ICD-10-CM | POA: Diagnosis not present

## 2015-08-27 DIAGNOSIS — M5136 Other intervertebral disc degeneration, lumbar region: Secondary | ICD-10-CM | POA: Diagnosis not present

## 2015-08-27 DIAGNOSIS — M9904 Segmental and somatic dysfunction of sacral region: Secondary | ICD-10-CM | POA: Diagnosis not present

## 2015-08-27 DIAGNOSIS — M5441 Lumbago with sciatica, right side: Secondary | ICD-10-CM | POA: Diagnosis not present

## 2015-08-27 DIAGNOSIS — M5137 Other intervertebral disc degeneration, lumbosacral region: Secondary | ICD-10-CM | POA: Diagnosis not present

## 2015-08-27 DIAGNOSIS — M9903 Segmental and somatic dysfunction of lumbar region: Secondary | ICD-10-CM | POA: Diagnosis not present

## 2015-08-28 DIAGNOSIS — M9902 Segmental and somatic dysfunction of thoracic region: Secondary | ICD-10-CM | POA: Diagnosis not present

## 2015-08-28 DIAGNOSIS — M9904 Segmental and somatic dysfunction of sacral region: Secondary | ICD-10-CM | POA: Diagnosis not present

## 2015-08-28 DIAGNOSIS — M5136 Other intervertebral disc degeneration, lumbar region: Secondary | ICD-10-CM | POA: Diagnosis not present

## 2015-08-28 DIAGNOSIS — M5441 Lumbago with sciatica, right side: Secondary | ICD-10-CM | POA: Diagnosis not present

## 2015-08-28 DIAGNOSIS — M9903 Segmental and somatic dysfunction of lumbar region: Secondary | ICD-10-CM | POA: Diagnosis not present

## 2015-08-28 DIAGNOSIS — M5137 Other intervertebral disc degeneration, lumbosacral region: Secondary | ICD-10-CM | POA: Diagnosis not present

## 2015-09-04 DIAGNOSIS — M5430 Sciatica, unspecified side: Secondary | ICD-10-CM | POA: Diagnosis not present

## 2015-09-23 DIAGNOSIS — D500 Iron deficiency anemia secondary to blood loss (chronic): Secondary | ICD-10-CM | POA: Diagnosis not present

## 2015-09-24 DIAGNOSIS — H524 Presbyopia: Secondary | ICD-10-CM | POA: Diagnosis not present

## 2015-09-24 DIAGNOSIS — H26493 Other secondary cataract, bilateral: Secondary | ICD-10-CM | POA: Diagnosis not present

## 2015-10-02 DIAGNOSIS — T63441D Toxic effect of venom of bees, accidental (unintentional), subsequent encounter: Secondary | ICD-10-CM | POA: Diagnosis not present

## 2015-10-03 DIAGNOSIS — T63441D Toxic effect of venom of bees, accidental (unintentional), subsequent encounter: Secondary | ICD-10-CM | POA: Diagnosis not present

## 2015-10-06 ENCOUNTER — Ambulatory Visit (INDEPENDENT_AMBULATORY_CARE_PROVIDER_SITE_OTHER): Payer: Medicare Other | Admitting: *Deleted

## 2015-10-06 DIAGNOSIS — IMO0001 Reserved for inherently not codable concepts without codable children: Secondary | ICD-10-CM

## 2015-10-06 DIAGNOSIS — T63441D Toxic effect of venom of bees, accidental (unintentional), subsequent encounter: Secondary | ICD-10-CM

## 2015-10-23 DIAGNOSIS — M859 Disorder of bone density and structure, unspecified: Secondary | ICD-10-CM | POA: Diagnosis not present

## 2015-10-23 DIAGNOSIS — R0609 Other forms of dyspnea: Secondary | ICD-10-CM | POA: Diagnosis not present

## 2015-10-23 DIAGNOSIS — Z1389 Encounter for screening for other disorder: Secondary | ICD-10-CM | POA: Diagnosis not present

## 2015-10-23 DIAGNOSIS — M8580 Other specified disorders of bone density and structure, unspecified site: Secondary | ICD-10-CM | POA: Diagnosis not present

## 2015-10-23 DIAGNOSIS — N289 Disorder of kidney and ureter, unspecified: Secondary | ICD-10-CM | POA: Diagnosis not present

## 2015-10-23 DIAGNOSIS — E559 Vitamin D deficiency, unspecified: Secondary | ICD-10-CM | POA: Diagnosis not present

## 2015-10-23 DIAGNOSIS — M5430 Sciatica, unspecified side: Secondary | ICD-10-CM | POA: Diagnosis not present

## 2015-10-23 DIAGNOSIS — Z13820 Encounter for screening for osteoporosis: Secondary | ICD-10-CM | POA: Diagnosis not present

## 2015-10-23 DIAGNOSIS — K279 Peptic ulcer, site unspecified, unspecified as acute or chronic, without hemorrhage or perforation: Secondary | ICD-10-CM | POA: Diagnosis not present

## 2015-10-24 ENCOUNTER — Other Ambulatory Visit: Payer: Self-pay

## 2015-10-28 DIAGNOSIS — R0609 Other forms of dyspnea: Secondary | ICD-10-CM | POA: Diagnosis not present

## 2015-10-28 DIAGNOSIS — R0602 Shortness of breath: Secondary | ICD-10-CM | POA: Diagnosis not present

## 2015-10-28 DIAGNOSIS — D500 Iron deficiency anemia secondary to blood loss (chronic): Secondary | ICD-10-CM | POA: Diagnosis not present

## 2015-10-29 DIAGNOSIS — M5430 Sciatica, unspecified side: Secondary | ICD-10-CM | POA: Diagnosis not present

## 2015-10-30 DIAGNOSIS — R0609 Other forms of dyspnea: Secondary | ICD-10-CM

## 2015-10-30 DIAGNOSIS — R002 Palpitations: Secondary | ICD-10-CM

## 2015-10-30 DIAGNOSIS — R0600 Dyspnea, unspecified: Secondary | ICD-10-CM

## 2015-10-30 DIAGNOSIS — R000 Tachycardia, unspecified: Secondary | ICD-10-CM

## 2015-10-30 HISTORY — DX: Other forms of dyspnea: R06.09

## 2015-10-30 HISTORY — DX: Tachycardia, unspecified: R00.0

## 2015-10-30 HISTORY — DX: Dyspnea, unspecified: R06.00

## 2015-10-30 HISTORY — DX: Palpitations: R00.2

## 2015-11-04 DIAGNOSIS — M25551 Pain in right hip: Secondary | ICD-10-CM | POA: Diagnosis not present

## 2015-11-06 DIAGNOSIS — R000 Tachycardia, unspecified: Secondary | ICD-10-CM | POA: Diagnosis not present

## 2015-11-07 DIAGNOSIS — R002 Palpitations: Secondary | ICD-10-CM | POA: Diagnosis not present

## 2015-11-07 DIAGNOSIS — R000 Tachycardia, unspecified: Secondary | ICD-10-CM | POA: Diagnosis not present

## 2015-11-07 DIAGNOSIS — R0609 Other forms of dyspnea: Secondary | ICD-10-CM | POA: Diagnosis not present

## 2015-11-27 ENCOUNTER — Ambulatory Visit (INDEPENDENT_AMBULATORY_CARE_PROVIDER_SITE_OTHER): Payer: Medicare Other | Admitting: *Deleted

## 2015-11-27 DIAGNOSIS — T63441D Toxic effect of venom of bees, accidental (unintentional), subsequent encounter: Secondary | ICD-10-CM | POA: Diagnosis not present

## 2015-11-27 DIAGNOSIS — IMO0001 Reserved for inherently not codable concepts without codable children: Secondary | ICD-10-CM

## 2015-12-01 DIAGNOSIS — R002 Palpitations: Secondary | ICD-10-CM | POA: Diagnosis not present

## 2015-12-01 DIAGNOSIS — R0609 Other forms of dyspnea: Secondary | ICD-10-CM | POA: Diagnosis not present

## 2015-12-01 DIAGNOSIS — R000 Tachycardia, unspecified: Secondary | ICD-10-CM | POA: Diagnosis not present

## 2015-12-02 DIAGNOSIS — D500 Iron deficiency anemia secondary to blood loss (chronic): Secondary | ICD-10-CM | POA: Diagnosis not present

## 2015-12-03 DIAGNOSIS — Z23 Encounter for immunization: Secondary | ICD-10-CM | POA: Diagnosis not present

## 2016-01-06 DIAGNOSIS — D500 Iron deficiency anemia secondary to blood loss (chronic): Secondary | ICD-10-CM | POA: Diagnosis not present

## 2016-01-08 ENCOUNTER — Ambulatory Visit (INDEPENDENT_AMBULATORY_CARE_PROVIDER_SITE_OTHER): Payer: Medicare Other | Admitting: *Deleted

## 2016-01-08 DIAGNOSIS — T63441D Toxic effect of venom of bees, accidental (unintentional), subsequent encounter: Secondary | ICD-10-CM | POA: Diagnosis not present

## 2016-01-08 DIAGNOSIS — IMO0001 Reserved for inherently not codable concepts without codable children: Secondary | ICD-10-CM

## 2016-01-13 DIAGNOSIS — R0609 Other forms of dyspnea: Secondary | ICD-10-CM | POA: Diagnosis not present

## 2016-01-13 DIAGNOSIS — R000 Tachycardia, unspecified: Secondary | ICD-10-CM | POA: Diagnosis not present

## 2016-01-13 DIAGNOSIS — R002 Palpitations: Secondary | ICD-10-CM | POA: Diagnosis not present

## 2016-02-02 DIAGNOSIS — R600 Localized edema: Secondary | ICD-10-CM | POA: Diagnosis not present

## 2016-02-02 DIAGNOSIS — M5430 Sciatica, unspecified side: Secondary | ICD-10-CM | POA: Diagnosis not present

## 2016-02-02 DIAGNOSIS — R0600 Dyspnea, unspecified: Secondary | ICD-10-CM | POA: Diagnosis not present

## 2016-02-02 DIAGNOSIS — R1030 Lower abdominal pain, unspecified: Secondary | ICD-10-CM | POA: Diagnosis not present

## 2016-02-02 DIAGNOSIS — M84459A Pathological fracture, hip, unspecified, initial encounter for fracture: Secondary | ICD-10-CM | POA: Diagnosis not present

## 2016-02-02 DIAGNOSIS — S32392A Other fracture of left ilium, initial encounter for closed fracture: Secondary | ICD-10-CM | POA: Diagnosis not present

## 2016-02-02 DIAGNOSIS — M7989 Other specified soft tissue disorders: Secondary | ICD-10-CM | POA: Diagnosis not present

## 2016-02-03 DIAGNOSIS — R590 Localized enlarged lymph nodes: Secondary | ICD-10-CM | POA: Diagnosis not present

## 2016-02-03 DIAGNOSIS — M84454A Pathological fracture, pelvis, initial encounter for fracture: Secondary | ICD-10-CM | POA: Diagnosis not present

## 2016-02-10 DIAGNOSIS — Z6835 Body mass index (BMI) 35.0-35.9, adult: Secondary | ICD-10-CM

## 2016-02-10 DIAGNOSIS — R590 Localized enlarged lymph nodes: Secondary | ICD-10-CM | POA: Insufficient documentation

## 2016-02-10 DIAGNOSIS — M25552 Pain in left hip: Secondary | ICD-10-CM | POA: Insufficient documentation

## 2016-02-10 HISTORY — DX: Pain in left hip: M25.552

## 2016-02-10 HISTORY — DX: Localized enlarged lymph nodes: R59.0

## 2016-02-10 HISTORY — DX: Body mass index (BMI) 35.0-35.9, adult: Z68.35

## 2016-02-11 ENCOUNTER — Other Ambulatory Visit (HOSPITAL_COMMUNITY)
Admission: RE | Admit: 2016-02-11 | Discharge: 2016-02-11 | Disposition: A | Discharge status: Home / Self Care | Payer: Medicare Other | Source: Ambulatory Visit | Attending: Vascular Surgery | Admitting: Vascular Surgery

## 2016-02-11 DIAGNOSIS — R590 Localized enlarged lymph nodes: Secondary | ICD-10-CM | POA: Diagnosis not present

## 2016-02-11 DIAGNOSIS — Z91030 Bee allergy status: Secondary | ICD-10-CM | POA: Diagnosis not present

## 2016-02-11 DIAGNOSIS — E669 Obesity, unspecified: Secondary | ICD-10-CM | POA: Diagnosis not present

## 2016-02-11 DIAGNOSIS — Z961 Presence of intraocular lens: Secondary | ICD-10-CM | POA: Diagnosis not present

## 2016-02-11 DIAGNOSIS — Z880 Allergy status to penicillin: Secondary | ICD-10-CM | POA: Diagnosis not present

## 2016-02-11 DIAGNOSIS — I10 Essential (primary) hypertension: Secondary | ICD-10-CM | POA: Diagnosis not present

## 2016-02-11 DIAGNOSIS — Z881 Allergy status to other antibiotic agents status: Secondary | ICD-10-CM | POA: Diagnosis not present

## 2016-02-11 DIAGNOSIS — K449 Diaphragmatic hernia without obstruction or gangrene: Secondary | ICD-10-CM | POA: Diagnosis not present

## 2016-02-11 DIAGNOSIS — Z974 Presence of external hearing-aid: Secondary | ICD-10-CM | POA: Diagnosis not present

## 2016-02-11 DIAGNOSIS — Z888 Allergy status to other drugs, medicaments and biological substances status: Secondary | ICD-10-CM | POA: Diagnosis not present

## 2016-02-11 DIAGNOSIS — Z886 Allergy status to analgesic agent status: Secondary | ICD-10-CM | POA: Diagnosis not present

## 2016-02-11 DIAGNOSIS — Z91018 Allergy to other foods: Secondary | ICD-10-CM | POA: Diagnosis not present

## 2016-02-11 DIAGNOSIS — K219 Gastro-esophageal reflux disease without esophagitis: Secondary | ICD-10-CM | POA: Diagnosis not present

## 2016-02-11 DIAGNOSIS — H9192 Unspecified hearing loss, left ear: Secondary | ICD-10-CM | POA: Diagnosis not present

## 2016-02-11 DIAGNOSIS — Z91010 Allergy to peanuts: Secondary | ICD-10-CM | POA: Diagnosis not present

## 2016-02-11 DIAGNOSIS — Z8711 Personal history of peptic ulcer disease: Secondary | ICD-10-CM | POA: Diagnosis not present

## 2016-02-11 DIAGNOSIS — Z79899 Other long term (current) drug therapy: Secondary | ICD-10-CM | POA: Diagnosis not present

## 2016-02-11 DIAGNOSIS — Z882 Allergy status to sulfonamides status: Secondary | ICD-10-CM | POA: Diagnosis not present

## 2016-02-11 DIAGNOSIS — C8335 Diffuse large B-cell lymphoma, lymph nodes of inguinal region and lower limb: Secondary | ICD-10-CM | POA: Diagnosis not present

## 2016-02-11 DIAGNOSIS — Z87891 Personal history of nicotine dependence: Secondary | ICD-10-CM | POA: Diagnosis not present

## 2016-02-11 DIAGNOSIS — Z91038 Other insect allergy status: Secondary | ICD-10-CM | POA: Diagnosis not present

## 2016-02-11 DIAGNOSIS — Z7983 Long term (current) use of bisphosphonates: Secondary | ICD-10-CM | POA: Diagnosis not present

## 2016-02-11 DIAGNOSIS — Z87442 Personal history of urinary calculi: Secondary | ICD-10-CM | POA: Diagnosis not present

## 2016-02-11 DIAGNOSIS — R591 Generalized enlarged lymph nodes: Secondary | ICD-10-CM | POA: Diagnosis not present

## 2016-02-11 DIAGNOSIS — Z91041 Radiographic dye allergy status: Secondary | ICD-10-CM | POA: Diagnosis not present

## 2016-02-17 DIAGNOSIS — D500 Iron deficiency anemia secondary to blood loss (chronic): Secondary | ICD-10-CM | POA: Diagnosis not present

## 2016-02-17 DIAGNOSIS — Z09 Encounter for follow-up examination after completed treatment for conditions other than malignant neoplasm: Secondary | ICD-10-CM

## 2016-02-17 HISTORY — DX: Encounter for follow-up examination after completed treatment for conditions other than malignant neoplasm: Z09

## 2016-02-20 DIAGNOSIS — C8335 Diffuse large B-cell lymphoma, lymph nodes of inguinal region and lower limb: Secondary | ICD-10-CM | POA: Diagnosis not present

## 2016-02-25 DIAGNOSIS — T451X1A Poisoning by antineoplastic and immunosuppressive drugs, accidental (unintentional), initial encounter: Secondary | ICD-10-CM | POA: Diagnosis not present

## 2016-02-25 DIAGNOSIS — I361 Nonrheumatic tricuspid (valve) insufficiency: Secondary | ICD-10-CM | POA: Diagnosis not present

## 2016-02-25 DIAGNOSIS — C8335 Diffuse large B-cell lymphoma, lymph nodes of inguinal region and lower limb: Secondary | ICD-10-CM | POA: Diagnosis not present

## 2016-02-25 DIAGNOSIS — C8590 Non-Hodgkin lymphoma, unspecified, unspecified site: Secondary | ICD-10-CM | POA: Diagnosis not present

## 2016-02-25 DIAGNOSIS — I351 Nonrheumatic aortic (valve) insufficiency: Secondary | ICD-10-CM | POA: Diagnosis not present

## 2016-02-25 DIAGNOSIS — I340 Nonrheumatic mitral (valve) insufficiency: Secondary | ICD-10-CM | POA: Diagnosis not present

## 2016-02-26 ENCOUNTER — Ambulatory Visit (INDEPENDENT_AMBULATORY_CARE_PROVIDER_SITE_OTHER): Payer: Medicare Other | Admitting: *Deleted

## 2016-02-26 DIAGNOSIS — IMO0001 Reserved for inherently not codable concepts without codable children: Secondary | ICD-10-CM

## 2016-02-26 DIAGNOSIS — T63441D Toxic effect of venom of bees, accidental (unintentional), subsequent encounter: Secondary | ICD-10-CM | POA: Diagnosis not present

## 2016-02-26 DIAGNOSIS — C8335 Diffuse large B-cell lymphoma, lymph nodes of inguinal region and lower limb: Secondary | ICD-10-CM | POA: Diagnosis not present

## 2016-02-26 DIAGNOSIS — C8338 Diffuse large B-cell lymphoma, lymph nodes of multiple sites: Secondary | ICD-10-CM | POA: Diagnosis not present

## 2016-03-03 DIAGNOSIS — Z87891 Personal history of nicotine dependence: Secondary | ICD-10-CM | POA: Diagnosis not present

## 2016-03-03 DIAGNOSIS — K449 Diaphragmatic hernia without obstruction or gangrene: Secondary | ICD-10-CM | POA: Diagnosis not present

## 2016-03-03 DIAGNOSIS — C8590 Non-Hodgkin lymphoma, unspecified, unspecified site: Secondary | ICD-10-CM | POA: Diagnosis not present

## 2016-03-03 DIAGNOSIS — I10 Essential (primary) hypertension: Secondary | ICD-10-CM | POA: Diagnosis not present

## 2016-03-03 DIAGNOSIS — Z79899 Other long term (current) drug therapy: Secondary | ICD-10-CM | POA: Diagnosis not present

## 2016-03-03 DIAGNOSIS — K219 Gastro-esophageal reflux disease without esophagitis: Secondary | ICD-10-CM | POA: Diagnosis not present

## 2016-03-03 DIAGNOSIS — E669 Obesity, unspecified: Secondary | ICD-10-CM | POA: Diagnosis not present

## 2016-03-03 DIAGNOSIS — C8335 Diffuse large B-cell lymphoma, lymph nodes of inguinal region and lower limb: Secondary | ICD-10-CM | POA: Diagnosis not present

## 2016-03-04 DIAGNOSIS — C8335 Diffuse large B-cell lymphoma, lymph nodes of inguinal region and lower limb: Secondary | ICD-10-CM | POA: Diagnosis not present

## 2016-03-05 DIAGNOSIS — C8335 Diffuse large B-cell lymphoma, lymph nodes of inguinal region and lower limb: Secondary | ICD-10-CM | POA: Diagnosis not present

## 2016-03-25 DIAGNOSIS — C8338 Diffuse large B-cell lymphoma, lymph nodes of multiple sites: Secondary | ICD-10-CM | POA: Diagnosis not present

## 2016-03-25 DIAGNOSIS — C8335 Diffuse large B-cell lymphoma, lymph nodes of inguinal region and lower limb: Secondary | ICD-10-CM | POA: Diagnosis not present

## 2016-03-25 DIAGNOSIS — Z5111 Encounter for antineoplastic chemotherapy: Secondary | ICD-10-CM | POA: Diagnosis not present

## 2016-04-06 DIAGNOSIS — D500 Iron deficiency anemia secondary to blood loss (chronic): Secondary | ICD-10-CM | POA: Diagnosis not present

## 2016-04-08 ENCOUNTER — Ambulatory Visit (INDEPENDENT_AMBULATORY_CARE_PROVIDER_SITE_OTHER): Payer: Medicare Other | Admitting: *Deleted

## 2016-04-08 DIAGNOSIS — T63441D Toxic effect of venom of bees, accidental (unintentional), subsequent encounter: Secondary | ICD-10-CM

## 2016-04-08 DIAGNOSIS — IMO0001 Reserved for inherently not codable concepts without codable children: Secondary | ICD-10-CM

## 2016-04-15 DIAGNOSIS — C8335 Diffuse large B-cell lymphoma, lymph nodes of inguinal region and lower limb: Secondary | ICD-10-CM | POA: Diagnosis not present

## 2016-04-15 DIAGNOSIS — Z5111 Encounter for antineoplastic chemotherapy: Secondary | ICD-10-CM | POA: Diagnosis not present

## 2016-05-05 DIAGNOSIS — K449 Diaphragmatic hernia without obstruction or gangrene: Secondary | ICD-10-CM | POA: Diagnosis not present

## 2016-05-05 DIAGNOSIS — C8590 Non-Hodgkin lymphoma, unspecified, unspecified site: Secondary | ICD-10-CM | POA: Diagnosis not present

## 2016-05-05 DIAGNOSIS — C8335 Diffuse large B-cell lymphoma, lymph nodes of inguinal region and lower limb: Secondary | ICD-10-CM | POA: Diagnosis not present

## 2016-05-06 DIAGNOSIS — D649 Anemia, unspecified: Secondary | ICD-10-CM | POA: Diagnosis not present

## 2016-05-06 DIAGNOSIS — C8335 Diffuse large B-cell lymphoma, lymph nodes of inguinal region and lower limb: Secondary | ICD-10-CM | POA: Diagnosis not present

## 2016-05-06 DIAGNOSIS — Z5111 Encounter for antineoplastic chemotherapy: Secondary | ICD-10-CM | POA: Diagnosis not present

## 2016-05-13 ENCOUNTER — Ambulatory Visit (INDEPENDENT_AMBULATORY_CARE_PROVIDER_SITE_OTHER): Payer: Medicare Other | Admitting: *Deleted

## 2016-05-13 DIAGNOSIS — IMO0001 Reserved for inherently not codable concepts without codable children: Secondary | ICD-10-CM

## 2016-05-13 DIAGNOSIS — T63441D Toxic effect of venom of bees, accidental (unintentional), subsequent encounter: Secondary | ICD-10-CM

## 2016-05-19 DIAGNOSIS — C8335 Diffuse large B-cell lymphoma, lymph nodes of inguinal region and lower limb: Secondary | ICD-10-CM | POA: Diagnosis not present

## 2016-05-20 DIAGNOSIS — C8335 Diffuse large B-cell lymphoma, lymph nodes of inguinal region and lower limb: Secondary | ICD-10-CM | POA: Diagnosis not present

## 2016-05-27 DIAGNOSIS — B029 Zoster without complications: Secondary | ICD-10-CM | POA: Diagnosis not present

## 2016-05-27 DIAGNOSIS — C8335 Diffuse large B-cell lymphoma, lymph nodes of inguinal region and lower limb: Secondary | ICD-10-CM | POA: Diagnosis not present

## 2016-05-27 DIAGNOSIS — C8338 Diffuse large B-cell lymphoma, lymph nodes of multiple sites: Secondary | ICD-10-CM | POA: Diagnosis not present

## 2016-05-27 DIAGNOSIS — Z5111 Encounter for antineoplastic chemotherapy: Secondary | ICD-10-CM | POA: Diagnosis not present

## 2016-06-17 DIAGNOSIS — C8335 Diffuse large B-cell lymphoma, lymph nodes of inguinal region and lower limb: Secondary | ICD-10-CM | POA: Diagnosis not present

## 2016-06-17 DIAGNOSIS — Z5111 Encounter for antineoplastic chemotherapy: Secondary | ICD-10-CM | POA: Diagnosis not present

## 2016-06-24 ENCOUNTER — Ambulatory Visit (INDEPENDENT_AMBULATORY_CARE_PROVIDER_SITE_OTHER): Payer: Medicare Other | Admitting: *Deleted

## 2016-06-24 DIAGNOSIS — T63441D Toxic effect of venom of bees, accidental (unintentional), subsequent encounter: Secondary | ICD-10-CM | POA: Diagnosis not present

## 2016-06-24 DIAGNOSIS — IMO0001 Reserved for inherently not codable concepts without codable children: Secondary | ICD-10-CM

## 2016-07-28 DIAGNOSIS — I700 Atherosclerosis of aorta: Secondary | ICD-10-CM | POA: Diagnosis not present

## 2016-07-28 DIAGNOSIS — K449 Diaphragmatic hernia without obstruction or gangrene: Secondary | ICD-10-CM | POA: Diagnosis not present

## 2016-07-28 DIAGNOSIS — C8335 Diffuse large B-cell lymphoma, lymph nodes of inguinal region and lower limb: Secondary | ICD-10-CM | POA: Diagnosis not present

## 2016-07-28 DIAGNOSIS — C8580 Other specified types of non-Hodgkin lymphoma, unspecified site: Secondary | ICD-10-CM | POA: Diagnosis not present

## 2016-07-29 DIAGNOSIS — C8338 Diffuse large B-cell lymphoma, lymph nodes of multiple sites: Secondary | ICD-10-CM | POA: Diagnosis not present

## 2016-07-29 DIAGNOSIS — M25552 Pain in left hip: Secondary | ICD-10-CM | POA: Diagnosis not present

## 2016-08-05 ENCOUNTER — Ambulatory Visit (INDEPENDENT_AMBULATORY_CARE_PROVIDER_SITE_OTHER): Payer: Medicare Other | Admitting: *Deleted

## 2016-08-05 DIAGNOSIS — T63441D Toxic effect of venom of bees, accidental (unintentional), subsequent encounter: Secondary | ICD-10-CM

## 2016-08-05 DIAGNOSIS — IMO0001 Reserved for inherently not codable concepts without codable children: Secondary | ICD-10-CM

## 2016-09-16 ENCOUNTER — Ambulatory Visit (INDEPENDENT_AMBULATORY_CARE_PROVIDER_SITE_OTHER): Payer: Medicare Other | Admitting: *Deleted

## 2016-09-16 DIAGNOSIS — IMO0001 Reserved for inherently not codable concepts without codable children: Secondary | ICD-10-CM

## 2016-09-16 DIAGNOSIS — T63441D Toxic effect of venom of bees, accidental (unintentional), subsequent encounter: Secondary | ICD-10-CM | POA: Diagnosis not present

## 2016-09-20 DIAGNOSIS — H524 Presbyopia: Secondary | ICD-10-CM | POA: Diagnosis not present

## 2016-09-20 DIAGNOSIS — H26493 Other secondary cataract, bilateral: Secondary | ICD-10-CM | POA: Diagnosis not present

## 2016-11-11 ENCOUNTER — Ambulatory Visit (INDEPENDENT_AMBULATORY_CARE_PROVIDER_SITE_OTHER): Payer: Medicare Other | Admitting: *Deleted

## 2016-11-11 DIAGNOSIS — IMO0001 Reserved for inherently not codable concepts without codable children: Secondary | ICD-10-CM

## 2016-11-11 DIAGNOSIS — T63441D Toxic effect of venom of bees, accidental (unintentional), subsequent encounter: Secondary | ICD-10-CM | POA: Diagnosis not present

## 2016-11-19 DIAGNOSIS — Z23 Encounter for immunization: Secondary | ICD-10-CM | POA: Diagnosis not present

## 2016-11-29 DIAGNOSIS — C8335 Diffuse large B-cell lymphoma, lymph nodes of inguinal region and lower limb: Secondary | ICD-10-CM | POA: Diagnosis not present

## 2016-11-29 DIAGNOSIS — Z9221 Personal history of antineoplastic chemotherapy: Secondary | ICD-10-CM | POA: Diagnosis not present

## 2016-11-29 DIAGNOSIS — Z8572 Personal history of non-Hodgkin lymphomas: Secondary | ICD-10-CM | POA: Diagnosis not present

## 2016-11-29 DIAGNOSIS — M84459A Pathological fracture, hip, unspecified, initial encounter for fracture: Secondary | ICD-10-CM | POA: Diagnosis not present

## 2016-12-01 DIAGNOSIS — C8335 Diffuse large B-cell lymphoma, lymph nodes of inguinal region and lower limb: Secondary | ICD-10-CM | POA: Diagnosis not present

## 2016-12-01 DIAGNOSIS — R590 Localized enlarged lymph nodes: Secondary | ICD-10-CM | POA: Diagnosis not present

## 2016-12-28 DIAGNOSIS — Z886 Allergy status to analgesic agent status: Secondary | ICD-10-CM | POA: Diagnosis not present

## 2016-12-28 DIAGNOSIS — Z880 Allergy status to penicillin: Secondary | ICD-10-CM | POA: Diagnosis not present

## 2016-12-28 DIAGNOSIS — Z87891 Personal history of nicotine dependence: Secondary | ICD-10-CM | POA: Diagnosis not present

## 2016-12-28 DIAGNOSIS — Z888 Allergy status to other drugs, medicaments and biological substances status: Secondary | ICD-10-CM | POA: Diagnosis not present

## 2016-12-28 DIAGNOSIS — Z91041 Radiographic dye allergy status: Secondary | ICD-10-CM | POA: Diagnosis not present

## 2016-12-28 DIAGNOSIS — Z882 Allergy status to sulfonamides status: Secondary | ICD-10-CM | POA: Diagnosis not present

## 2016-12-28 DIAGNOSIS — S32302A Unspecified fracture of left ilium, initial encounter for closed fracture: Secondary | ICD-10-CM | POA: Diagnosis not present

## 2017-01-10 ENCOUNTER — Ambulatory Visit (INDEPENDENT_AMBULATORY_CARE_PROVIDER_SITE_OTHER): Payer: Medicare Other | Admitting: *Deleted

## 2017-01-10 DIAGNOSIS — T63441D Toxic effect of venom of bees, accidental (unintentional), subsequent encounter: Secondary | ICD-10-CM | POA: Diagnosis not present

## 2017-01-10 DIAGNOSIS — IMO0001 Reserved for inherently not codable concepts without codable children: Secondary | ICD-10-CM

## 2017-01-27 ENCOUNTER — Encounter: Payer: Self-pay | Admitting: Allergy and Immunology

## 2017-01-27 ENCOUNTER — Ambulatory Visit (INDEPENDENT_AMBULATORY_CARE_PROVIDER_SITE_OTHER): Payer: Medicare Other | Admitting: Allergy and Immunology

## 2017-01-27 VITALS — BP 114/84 | HR 92 | Resp 18

## 2017-01-27 DIAGNOSIS — T63441D Toxic effect of venom of bees, accidental (unintentional), subsequent encounter: Secondary | ICD-10-CM | POA: Diagnosis not present

## 2017-01-27 DIAGNOSIS — IMO0001 Reserved for inherently not codable concepts without codable children: Secondary | ICD-10-CM

## 2017-01-27 NOTE — Patient Instructions (Addendum)
  1. Continue immunotherapy  2. Continue EpiPen, Benadryl, M.D./ER for allergic reaction  3. Return to clinic in 1 year or earlier if problem

## 2017-01-27 NOTE — Progress Notes (Signed)
Follow-up Note  Referring Provider: [name], MD Primary Provider: [name], MD Date of Office Visit: 01/27/2017  Subjective:   Alexis Stephens (DOB: 19-Dec-1935) is a 81 y.o. female who returns to the [facility] on 01/27/2017 in re-evaluation of the following:  HPI: Alexis Stephens presents to this clinic in evaluation of her hymenoptera venom hypersensitivity state.  Her last visit to this clinic was 25 August 2015.  She has really done very well on her immunotherapy.  Currently this form of therapy is every 8 weeks.  She has not had any adverse effect secondary to this immunotherapy.  She has not had any stings out in the field.  She does have an injectable epinephrine device.  When I last saw Alexis Stephens in this clinic in June 2017 she related a story of shortness of breath and weight gain and had documented tachycardia for which she went down the road of cardiac evaluation and then was ultimately diagnosed with stage IV non-Hodgkin's lymphoma with pathological fracture of her iliac and was treated with chemotherapy by Dr. Bobby Rumpf from January through May and is doing very well at this point in time.  She did obtain the flu vaccine this year and has had a Pneumovax and the shingles vaccine as well.  Allergies as of 01/27/2017      Reactions   Ivp Dye [iodinated Diagnostic Agents] Other (See Comments)   CARDIAC ARREST   Mixed Vespid Venom Anaphylaxis   Wasp Venom Anaphylaxis   Furadantin [nitrofurantoin] Hives, Nausea Only   Ibuprofen Hives   Iodine    Penicillins    Sulfa Antibiotics       Medication List      alendronate 70 MG tablet Commonly known as:  FOSAMAX take 1 tablet by mouth every week (No food / meds for 30 min. Remain Upright, DO NOT LAY DOWN)   EPIPEN 2-PAK 0.3 mg/0.3 mL Soaj injection Generic drug:  EPINEPHrine Inject 0.3 mg into the muscle once.   multivitamin capsule Take by mouth.   TYLENOL PO Take by mouth as needed.        Past Medical History:  Diagnosis Date  . Cataract   . GERD (gastroesophageal reflux disease)   . Hymenoptera allergy   . Swelling     Past Surgical History:  Procedure Laterality Date  . ABDOMINAL HYSTERECTOMY    . ADENOIDECTOMY    . APPENDECTOMY    . CHOLECYSTECTOMY    . EYE SURGERY    . NEPHROLITHOTOMY Left   . TONSILLECTOMY      Review of systems negative except as noted in HPI / PMHx or noted below:  Review of Systems  Constitutional: Negative.   HENT: Negative.   Eyes: Negative.   Respiratory: Negative.   Cardiovascular: Negative.   Gastrointestinal: Negative.   Genitourinary: Negative.   Musculoskeletal: Negative.   Skin: Negative.   Neurological: Negative.   Endo/Heme/Allergies: Negative.   Psychiatric/Behavioral: Negative.      Objective:   Vitals:   01/27/17 1504  BP: 114/84  Pulse: 92  Resp: 18          Physical Exam  Constitutional: She is well-developed, well-nourished, and in no distress.  Cane  HENT:  Head: Normocephalic.  Right Ear: Tympanic membrane, external ear and ear canal normal.  Left Ear: Tympanic membrane, external ear and ear canal normal.  Nose: Nose normal. No mucosal edema or rhinorrhea.  Mouth/Throat: Uvula is midline, oropharynx is clear and  moist and mucous membranes are normal. No oropharyngeal exudate.  Eyes: Conjunctivae are normal.  Neck: Trachea normal. No tracheal tenderness present. No tracheal deviation present. No thyromegaly present.  Cardiovascular: Normal rate, regular rhythm, S1 normal, S2 normal and normal heart sounds.  No murmur heard. Pulmonary/Chest: Breath sounds normal. No stridor. No respiratory distress. She has no wheezes. She has no rales.  Musculoskeletal: She exhibits no edema.  Lymphadenopathy:       Head (right side): No tonsillar adenopathy present.       Head (left side): No tonsillar adenopathy present.    She has no cervical adenopathy.  Neurological: She is alert. Gait normal.   Skin: No rash noted. She is not diaphoretic. No erythema. Nails show no clubbing.  Psychiatric: Mood and affect normal.    Diagnostics: none  Assessment and Plan:   1. Hymenoptera reaction, accidental or unintentional, subsequent encounter     1. Continue immunotherapy  2. Continue EpiPen, Benadryl, M.D./ER for allergic reaction  3. Return to clinic in 1 year or earlier if problem  Alexis Stephens appears to be doing well with her immunotherapy directed against mixed vespid and wasp and she will continue on this treatment and I will see her in this clinic in one year or earlier if there is a problem.  Allena Katz, MD [facility] [place]

## 2017-01-31 ENCOUNTER — Encounter: Payer: Self-pay | Admitting: Allergy and Immunology

## 2017-03-14 ENCOUNTER — Ambulatory Visit (INDEPENDENT_AMBULATORY_CARE_PROVIDER_SITE_OTHER): Payer: Medicare Other

## 2017-03-14 DIAGNOSIS — IMO0001 Reserved for inherently not codable concepts without codable children: Secondary | ICD-10-CM

## 2017-03-14 DIAGNOSIS — T63441D Toxic effect of venom of bees, accidental (unintentional), subsequent encounter: Secondary | ICD-10-CM

## 2017-04-01 DIAGNOSIS — Z8572 Personal history of non-Hodgkin lymphomas: Secondary | ICD-10-CM | POA: Diagnosis not present

## 2017-04-01 DIAGNOSIS — N289 Disorder of kidney and ureter, unspecified: Secondary | ICD-10-CM | POA: Diagnosis not present

## 2017-04-01 DIAGNOSIS — D649 Anemia, unspecified: Secondary | ICD-10-CM | POA: Diagnosis not present

## 2017-04-01 DIAGNOSIS — C8338 Diffuse large B-cell lymphoma, lymph nodes of multiple sites: Secondary | ICD-10-CM | POA: Diagnosis not present

## 2017-05-12 ENCOUNTER — Ambulatory Visit (INDEPENDENT_AMBULATORY_CARE_PROVIDER_SITE_OTHER): Payer: Medicare Other | Admitting: *Deleted

## 2017-05-12 DIAGNOSIS — IMO0001 Reserved for inherently not codable concepts without codable children: Secondary | ICD-10-CM

## 2017-05-12 DIAGNOSIS — T63441D Toxic effect of venom of bees, accidental (unintentional), subsequent encounter: Secondary | ICD-10-CM

## 2017-06-02 DIAGNOSIS — N3000 Acute cystitis without hematuria: Secondary | ICD-10-CM | POA: Diagnosis not present

## 2017-06-02 DIAGNOSIS — N3001 Acute cystitis with hematuria: Secondary | ICD-10-CM | POA: Diagnosis not present

## 2017-06-13 DIAGNOSIS — M25551 Pain in right hip: Secondary | ICD-10-CM | POA: Diagnosis not present

## 2017-06-13 DIAGNOSIS — Z87311 Personal history of (healed) other pathological fracture: Secondary | ICD-10-CM | POA: Diagnosis not present

## 2017-06-13 DIAGNOSIS — M47816 Spondylosis without myelopathy or radiculopathy, lumbar region: Secondary | ICD-10-CM | POA: Diagnosis not present

## 2017-06-13 DIAGNOSIS — S32302K Unspecified fracture of left ilium, subsequent encounter for fracture with nonunion: Secondary | ICD-10-CM | POA: Diagnosis not present

## 2017-06-13 DIAGNOSIS — M160 Bilateral primary osteoarthritis of hip: Secondary | ICD-10-CM | POA: Diagnosis not present

## 2017-07-07 ENCOUNTER — Ambulatory Visit (INDEPENDENT_AMBULATORY_CARE_PROVIDER_SITE_OTHER): Payer: Medicare Other | Admitting: *Deleted

## 2017-07-07 DIAGNOSIS — IMO0001 Reserved for inherently not codable concepts without codable children: Secondary | ICD-10-CM

## 2017-07-07 DIAGNOSIS — T63441D Toxic effect of venom of bees, accidental (unintentional), subsequent encounter: Secondary | ICD-10-CM

## 2017-08-05 DIAGNOSIS — Z9221 Personal history of antineoplastic chemotherapy: Secondary | ICD-10-CM | POA: Diagnosis not present

## 2017-08-05 DIAGNOSIS — Z8572 Personal history of non-Hodgkin lymphomas: Secondary | ICD-10-CM | POA: Diagnosis not present

## 2017-08-05 DIAGNOSIS — C8338 Diffuse large B-cell lymphoma, lymph nodes of multiple sites: Secondary | ICD-10-CM | POA: Diagnosis not present

## 2017-09-08 ENCOUNTER — Ambulatory Visit (INDEPENDENT_AMBULATORY_CARE_PROVIDER_SITE_OTHER): Payer: Medicare Other | Admitting: *Deleted

## 2017-09-08 DIAGNOSIS — IMO0001 Reserved for inherently not codable concepts without codable children: Secondary | ICD-10-CM

## 2017-09-08 DIAGNOSIS — T63441D Toxic effect of venom of bees, accidental (unintentional), subsequent encounter: Secondary | ICD-10-CM

## 2017-09-20 DIAGNOSIS — H26493 Other secondary cataract, bilateral: Secondary | ICD-10-CM | POA: Diagnosis not present

## 2017-09-29 ENCOUNTER — Other Ambulatory Visit: Payer: Self-pay

## 2017-11-03 ENCOUNTER — Ambulatory Visit: Payer: Self-pay

## 2017-11-07 ENCOUNTER — Ambulatory Visit (INDEPENDENT_AMBULATORY_CARE_PROVIDER_SITE_OTHER): Payer: Medicare Other | Admitting: *Deleted

## 2017-11-07 DIAGNOSIS — T63441D Toxic effect of venom of bees, accidental (unintentional), subsequent encounter: Secondary | ICD-10-CM

## 2017-11-07 DIAGNOSIS — IMO0001 Reserved for inherently not codable concepts without codable children: Secondary | ICD-10-CM

## 2017-11-14 DIAGNOSIS — I209 Angina pectoris, unspecified: Secondary | ICD-10-CM | POA: Diagnosis not present

## 2017-11-14 DIAGNOSIS — I110 Hypertensive heart disease with heart failure: Secondary | ICD-10-CM | POA: Diagnosis not present

## 2017-11-14 DIAGNOSIS — R0602 Shortness of breath: Secondary | ICD-10-CM | POA: Diagnosis not present

## 2017-11-14 DIAGNOSIS — R0600 Dyspnea, unspecified: Secondary | ICD-10-CM | POA: Diagnosis not present

## 2017-11-14 DIAGNOSIS — R0609 Other forms of dyspnea: Secondary | ICD-10-CM | POA: Diagnosis not present

## 2017-11-15 DIAGNOSIS — R748 Abnormal levels of other serum enzymes: Secondary | ICD-10-CM | POA: Diagnosis not present

## 2017-11-15 DIAGNOSIS — I351 Nonrheumatic aortic (valve) insufficiency: Secondary | ICD-10-CM | POA: Diagnosis not present

## 2017-11-15 DIAGNOSIS — I472 Ventricular tachycardia: Secondary | ICD-10-CM | POA: Diagnosis not present

## 2017-11-15 DIAGNOSIS — R7989 Other specified abnormal findings of blood chemistry: Secondary | ICD-10-CM | POA: Diagnosis not present

## 2017-11-15 DIAGNOSIS — R0609 Other forms of dyspnea: Secondary | ICD-10-CM | POA: Diagnosis not present

## 2017-11-15 DIAGNOSIS — I509 Heart failure, unspecified: Secondary | ICD-10-CM | POA: Diagnosis not present

## 2017-11-15 DIAGNOSIS — C8510 Unspecified B-cell lymphoma, unspecified site: Secondary | ICD-10-CM | POA: Diagnosis not present

## 2017-11-16 DIAGNOSIS — Z8711 Personal history of peptic ulcer disease: Secondary | ICD-10-CM | POA: Diagnosis not present

## 2017-11-16 DIAGNOSIS — Z882 Allergy status to sulfonamides status: Secondary | ICD-10-CM | POA: Diagnosis not present

## 2017-11-16 DIAGNOSIS — I472 Ventricular tachycardia: Secondary | ICD-10-CM | POA: Diagnosis not present

## 2017-11-16 DIAGNOSIS — E876 Hypokalemia: Secondary | ICD-10-CM

## 2017-11-16 DIAGNOSIS — R0600 Dyspnea, unspecified: Secondary | ICD-10-CM | POA: Diagnosis not present

## 2017-11-16 DIAGNOSIS — Z87891 Personal history of nicotine dependence: Secondary | ICD-10-CM | POA: Diagnosis not present

## 2017-11-16 DIAGNOSIS — I429 Cardiomyopathy, unspecified: Secondary | ICD-10-CM | POA: Diagnosis not present

## 2017-11-16 DIAGNOSIS — R0609 Other forms of dyspnea: Secondary | ICD-10-CM | POA: Diagnosis not present

## 2017-11-16 DIAGNOSIS — I5021 Acute systolic (congestive) heart failure: Secondary | ICD-10-CM | POA: Diagnosis not present

## 2017-11-16 DIAGNOSIS — Z79899 Other long term (current) drug therapy: Secondary | ICD-10-CM | POA: Diagnosis not present

## 2017-11-16 DIAGNOSIS — I110 Hypertensive heart disease with heart failure: Secondary | ICD-10-CM | POA: Diagnosis not present

## 2017-11-16 DIAGNOSIS — C8510 Unspecified B-cell lymphoma, unspecified site: Secondary | ICD-10-CM | POA: Diagnosis not present

## 2017-11-16 DIAGNOSIS — Z91041 Radiographic dye allergy status: Secondary | ICD-10-CM | POA: Diagnosis not present

## 2017-11-16 DIAGNOSIS — I509 Heart failure, unspecified: Secondary | ICD-10-CM | POA: Diagnosis not present

## 2017-11-16 DIAGNOSIS — R7989 Other specified abnormal findings of blood chemistry: Secondary | ICD-10-CM | POA: Diagnosis not present

## 2017-11-16 DIAGNOSIS — Z888 Allergy status to other drugs, medicaments and biological substances status: Secondary | ICD-10-CM | POA: Diagnosis not present

## 2017-11-16 DIAGNOSIS — I428 Other cardiomyopathies: Secondary | ICD-10-CM | POA: Diagnosis not present

## 2017-11-16 DIAGNOSIS — K219 Gastro-esophageal reflux disease without esophagitis: Secondary | ICD-10-CM | POA: Diagnosis present

## 2017-11-16 DIAGNOSIS — Z880 Allergy status to penicillin: Secondary | ICD-10-CM | POA: Diagnosis not present

## 2017-11-16 DIAGNOSIS — Z8572 Personal history of non-Hodgkin lymphomas: Secondary | ICD-10-CM | POA: Diagnosis not present

## 2017-11-17 DIAGNOSIS — I5021 Acute systolic (congestive) heart failure: Secondary | ICD-10-CM

## 2017-11-24 DIAGNOSIS — Z9181 History of falling: Secondary | ICD-10-CM | POA: Diagnosis not present

## 2017-11-24 DIAGNOSIS — I509 Heart failure, unspecified: Secondary | ICD-10-CM | POA: Diagnosis not present

## 2017-11-24 DIAGNOSIS — Z23 Encounter for immunization: Secondary | ICD-10-CM | POA: Diagnosis not present

## 2017-11-24 DIAGNOSIS — Z13820 Encounter for screening for osteoporosis: Secondary | ICD-10-CM | POA: Diagnosis not present

## 2017-11-24 DIAGNOSIS — Z1339 Encounter for screening examination for other mental health and behavioral disorders: Secondary | ICD-10-CM | POA: Diagnosis not present

## 2017-11-24 DIAGNOSIS — M859 Disorder of bone density and structure, unspecified: Secondary | ICD-10-CM | POA: Diagnosis not present

## 2017-11-24 DIAGNOSIS — Z1331 Encounter for screening for depression: Secondary | ICD-10-CM | POA: Diagnosis not present

## 2017-11-24 DIAGNOSIS — M8580 Other specified disorders of bone density and structure, unspecified site: Secondary | ICD-10-CM | POA: Diagnosis not present

## 2017-11-25 ENCOUNTER — Other Ambulatory Visit: Payer: Self-pay

## 2017-11-25 ENCOUNTER — Encounter: Payer: Self-pay | Admitting: Cardiology

## 2017-11-25 ENCOUNTER — Ambulatory Visit (INDEPENDENT_AMBULATORY_CARE_PROVIDER_SITE_OTHER): Payer: Medicare Other | Admitting: Cardiology

## 2017-11-25 DIAGNOSIS — I428 Other cardiomyopathies: Secondary | ICD-10-CM

## 2017-11-25 DIAGNOSIS — I5020 Unspecified systolic (congestive) heart failure: Secondary | ICD-10-CM | POA: Diagnosis not present

## 2017-11-25 HISTORY — DX: Other cardiomyopathies: I42.8

## 2017-11-25 HISTORY — DX: Unspecified systolic (congestive) heart failure: I50.20

## 2017-11-25 LAB — BASIC METABOLIC PANEL
BUN/Creatinine Ratio: 17 (ref 12–28)
BUN: 22 mg/dL (ref 8–27)
CALCIUM: 10.1 mg/dL (ref 8.7–10.3)
CO2: 26 mmol/L (ref 20–29)
Chloride: 95 mmol/L — ABNORMAL LOW (ref 96–106)
Creatinine, Ser: 1.29 mg/dL — ABNORMAL HIGH (ref 0.57–1.00)
GFR calc Af Amer: 45 mL/min/{1.73_m2} — ABNORMAL LOW (ref >59)
GFR, EST NON AFRICAN AMERICAN: 39 mL/min/{1.73_m2} — AB (ref >59)
GLUCOSE: 83 mg/dL (ref 65–99)
POTASSIUM: 4.9 mmol/L (ref 3.5–5.2)
SODIUM: 137 mmol/L (ref 134–144)

## 2017-11-25 LAB — PRO B NATRIURETIC PEPTIDE: NT-PRO BNP: 3191 pg/mL — AB (ref 0–738)

## 2017-11-25 LAB — MAGNESIUM: MAGNESIUM: 2.2 mg/dL (ref 1.6–2.3)

## 2017-11-25 MED ORDER — CARVEDILOL 6.25 MG PO TABS: 6.2500 mg | ORAL_TABLET | Freq: Two times a day (BID) | ORAL | 1 refills | Status: DC

## 2017-11-25 MED ORDER — FUROSEMIDE 20 MG PO TABS: 20.0000 mg | ORAL_TABLET | Freq: Every day | ORAL | 2 refills | Status: DC

## 2017-11-25 NOTE — Progress Notes (Signed)
Cardiology Office Note:    Date:  11/25/2017   ID:  Alexis Stephens, DOB [birth year], MRN [id number]  PCP:  [name], MD  Cardiologist:  Jenean Lindau, MD   Referring MD: [name], MD    ASSESSMENT:    1. Systolic heart failure, unspecified HF chronicity ([place])   2. Nonischemic cardiomyopathy ([place])    PLAN:    In order of problems listed above:  1. Primary prevention stressed with the patient.  Importance of compliance with diet and medication stressed and she vocalized understanding.  Her blood pressure is stable.  Congestive heart failure education is given.  Diet was discussed weighing herself regularly was discussed and she is doing that very meticulously. 2. Pressure medications are fine.  She is not on an ACE inhibitor because of borderline blood pressure. 3. She will have a Chem-7 and a BMP and magnesium level done today. 4. She will be seen in follow-up appointment in 3 months or earlier if she has any concerns.  At that time we will redo her echocardiogram also.    Medication Adjustments/Labs and Tests Ordered: Current medicines are reviewed at length with the patient today.  Concerns regarding medicines are outlined above.  No orders of the defined types were placed in this encounter.  No orders of the defined types were placed in this encounter.    History of Present Illness:    Alexis Stephens is a 82 y.o. female who is being seen today for the evaluation of cardiomyopathy at the request of [name], MD.  Patient has history of lymphoma.  Patient received cardiomyopathy.  She went to [facility] recently with congestive heart failure and was found to have a severely depressed ejection fraction.  I reviewed [facility] hospital notes extensively including cardiac consultation.  This was thought to be a result of possibly chemotherapy.  Her troponins were unremarkable.  Since then she has been discharged and doing well.   She denies any problems.  No chest pain orthopnea PND.  She takes care of activities of daily living.  She has a very supportive daughter.  She accompanies her to this visit.  Past Medical History:  Diagnosis Date  . Cataract   . GERD (gastroesophageal reflux disease)   . Hymenoptera allergy   . Swelling     Past Surgical History:  Procedure Laterality Date  . ABDOMINAL HYSTERECTOMY    . ADENOIDECTOMY    . APPENDECTOMY    . CHOLECYSTECTOMY    . EYE SURGERY    . NEPHROLITHOTOMY Left   . TONSILLECTOMY      Current Medications: Current Meds  Medication Sig  . Acetaminophen (TYLENOL PO) Take by mouth as needed.  Marland Kitchen EPINEPHrine (EPIPEN 2-PAK) 0.3 mg/0.3 mL IJ SOAJ injection Inject 0.3 mg into the muscle once.  . furosemide (LASIX) 20 MG tablet Take 1 tablet by mouth daily.  . Multiple Vitamin (MULTIVITAMIN) capsule Take by mouth.  . potassium chloride (K-DUR) 10 MEQ tablet Take 1 tablet by mouth daily.     Allergies:   Ivp dye [iodinated diagnostic agents]; Mixed vespid venom; Wasp venom; Bee venom; Furadantin [nitrofurantoin]; Ibuprofen; Iodine; Iodine-131; Magnesium citrate; Penicillamine; Penicillins; Sulfa antibiotics; and Sulfasalazine   Social History   Socioeconomic History  . Marital status: Widowed    Spouse name: Not on file  . Number of children: Not on file  . Years of education: Not on file  . Highest education level: Not on file  Occupational History  . Not on file  Social Needs  . Financial resource strain: Not on file  . Food insecurity:    Worry: Not on file    Inability: Not on file  . Transportation needs:    Medical: Not on file    Non-medical: Not on file  Tobacco Use  . Smoking status: Former Smoker    Last attempt to quit: 08/24/1993    Years since quitting: 24.2  . Smokeless tobacco: Never Used  Substance and Sexual Activity  . Alcohol use: Not on file  . Drug use: Not on file  . Sexual activity: Not on file  Lifestyle  . Physical  activity:    Days per week: Not on file    Minutes per session: Not on file  . Stress: Not on file  Relationships  . Social connections:    Talks on phone: Not on file    Gets together: Not on file    Attends religious service: Not on file    Active member of club or organization: Not on file    Attends meetings of clubs or organizations: Not on file    Relationship status: Not on file  Other Topics Concern  . Not on file  Social History Narrative  . Not on file     Family History: The patient's family history includes Cancer in her brother; Osteoarthritis in her mother; Other in her father; Rheum arthritis in her mother.  ROS:   Please see the history of present illness.    All other systems reviewed and are negative.  EKGs/Labs/Other Studies Reviewed:    The following studies were reviewed today: I reviewed [facility] hospital records extensively.  EKG reveals sinus rhythm and nonspecific ST-T changes.   Recent Labs: No results found for requested labs within last 8760 hours.  Recent Lipid Panel No results found for: CHOL, TRIG, HDL, CHOLHDL, VLDL, LDLCALC, LDLDIRECT  Physical Exam:    VS:  BP 110/62 (BP Location: Right Arm, Patient Position: Sitting, Cuff Size: Normal)   Pulse 84   Ht 5\' 1"  (1.549 m)   Wt 148 lb (67.1 kg)   SpO2 95%   BMI 27.96 kg/m     Wt Readings from Last 3 Encounters:  11/25/17 148 lb (67.1 kg)  08/25/15 189 lb 6 oz (85.9 kg)     GEN: Patient is in no acute distress HEENT: Normal NECK: No JVD; No carotid bruits LYMPHATICS: No lymphadenopathy CARDIAC: S1 S2 regular, 2/6 systolic murmur at the apex. RESPIRATORY:  Clear to auscultation without rales, wheezing or rhonchi  ABDOMEN: Soft, non-tender, non-distended MUSCULOSKELETAL:  No edema; No deformity  SKIN: Warm and dry NEUROLOGIC:  Alert and oriented x 3 PSYCHIATRIC:  Normal affect    Signed, Jenean Lindau, MD  11/25/2017 11:12 AM    [place]

## 2017-11-25 NOTE — Addendum Note (Signed)
Addended by: Tarri Glenn on: 11/25/2017 11:32 AM   Modules accepted: Orders

## 2017-11-25 NOTE — Patient Instructions (Signed)
Medication Instructions:  Your physician recommends that you continue on your current medications as directed. Please refer to the Current Medication list given to you today.   Labwork: Your physician recommends that you return for lab work today: BMP, BNP and magnesium   Testing/Procedures: Your physician has requested that you have an echocardiogram. Echocardiography is a painless test that uses sound waves to create images of your heart. It provides your doctor with information about the size and shape of your heart and how well your heart's chambers and valves are working. This procedure takes approximately one hour. There are no restrictions for this procedure.    Follow-Up: Your physician recommends that you schedule a follow-up appointment in: 3 months    Any Other Special Instructions Will Be Listed Below (If Applicable).     If you need a refill on your cardiac medications before your next appointment, please call your pharmacy.

## 2017-11-29 ENCOUNTER — Other Ambulatory Visit: Payer: Self-pay

## 2017-11-29 DIAGNOSIS — Z23 Encounter for immunization: Secondary | ICD-10-CM | POA: Diagnosis not present

## 2017-12-01 ENCOUNTER — Other Ambulatory Visit: Payer: Self-pay

## 2017-12-01 MED ORDER — POTASSIUM CHLORIDE ER 10 MEQ PO TBCR: 10.0000 meq | EXTENDED_RELEASE_TABLET | Freq: Every day | ORAL | 1 refills | Status: DC

## 2017-12-05 DIAGNOSIS — Z9221 Personal history of antineoplastic chemotherapy: Secondary | ICD-10-CM | POA: Diagnosis not present

## 2017-12-05 DIAGNOSIS — I420 Dilated cardiomyopathy: Secondary | ICD-10-CM | POA: Diagnosis not present

## 2017-12-05 DIAGNOSIS — C8335 Diffuse large B-cell lymphoma, lymph nodes of inguinal region and lower limb: Secondary | ICD-10-CM | POA: Diagnosis not present

## 2017-12-13 DIAGNOSIS — Z1331 Encounter for screening for depression: Secondary | ICD-10-CM | POA: Diagnosis not present

## 2017-12-13 DIAGNOSIS — N289 Disorder of kidney and ureter, unspecified: Secondary | ICD-10-CM | POA: Diagnosis not present

## 2017-12-13 DIAGNOSIS — M810 Age-related osteoporosis without current pathological fracture: Secondary | ICD-10-CM | POA: Diagnosis not present

## 2017-12-13 DIAGNOSIS — Z0000 Encounter for general adult medical examination without abnormal findings: Secondary | ICD-10-CM | POA: Diagnosis not present

## 2017-12-13 DIAGNOSIS — Z6827 Body mass index (BMI) 27.0-27.9, adult: Secondary | ICD-10-CM | POA: Diagnosis not present

## 2018-01-02 ENCOUNTER — Ambulatory Visit (INDEPENDENT_AMBULATORY_CARE_PROVIDER_SITE_OTHER): Payer: Medicare Other | Admitting: *Deleted

## 2018-01-02 DIAGNOSIS — T63441D Toxic effect of venom of bees, accidental (unintentional), subsequent encounter: Secondary | ICD-10-CM

## 2018-01-02 DIAGNOSIS — IMO0001 Reserved for inherently not codable concepts without codable children: Secondary | ICD-10-CM

## 2018-01-06 ENCOUNTER — Ambulatory Visit (INDEPENDENT_AMBULATORY_CARE_PROVIDER_SITE_OTHER): Payer: Medicare Other

## 2018-01-06 DIAGNOSIS — I5020 Unspecified systolic (congestive) heart failure: Secondary | ICD-10-CM

## 2018-01-06 DIAGNOSIS — I428 Other cardiomyopathies: Secondary | ICD-10-CM | POA: Diagnosis not present

## 2018-01-06 NOTE — Progress Notes (Signed)
Complete echocardiogram has been performed.  Jimmy Ahria Slappey RDCS, RVT 

## 2018-01-18 ENCOUNTER — Other Ambulatory Visit: Payer: Self-pay

## 2018-01-20 ENCOUNTER — Ambulatory Visit (INDEPENDENT_AMBULATORY_CARE_PROVIDER_SITE_OTHER): Payer: Medicare Other | Admitting: Cardiology

## 2018-01-20 ENCOUNTER — Encounter: Payer: Self-pay | Admitting: Cardiology

## 2018-01-20 VITALS — BP 105/60 | HR 92 | Ht 61.0 in | Wt 154.0 lb

## 2018-01-20 DIAGNOSIS — I428 Other cardiomyopathies: Secondary | ICD-10-CM | POA: Diagnosis not present

## 2018-01-20 LAB — BASIC METABOLIC PANEL
BUN/Creatinine Ratio: 16 (ref 12–28)
BUN: 20 mg/dL (ref 8–27)
CALCIUM: 9.4 mg/dL (ref 8.7–10.3)
CO2: 25 mmol/L (ref 20–29)
CREATININE: 1.23 mg/dL — AB (ref 0.57–1.00)
Chloride: 97 mmol/L (ref 96–106)
GFR calc Af Amer: 48 mL/min/{1.73_m2} — ABNORMAL LOW (ref >59)
GFR, EST NON AFRICAN AMERICAN: 42 mL/min/{1.73_m2} — AB (ref >59)
Glucose: 83 mg/dL (ref 65–99)
Potassium: 4.1 mmol/L (ref 3.5–5.2)
Sodium: 140 mmol/L (ref 134–144)

## 2018-01-20 NOTE — Progress Notes (Signed)
Cardiology Office Note:    Date:  01/20/2018   ID:  Alexis Stephens, DOB 1935/09/03, MRN [id number]  PCP:  [name], MD  Cardiologist:  Jenean Lindau, MD   Referring MD: [name], MD    ASSESSMENT:    1. Nonischemic cardiomyopathy ([place])    PLAN:    In order of problems listed above:  1. Primary prevention stressed with the patient.  Importance of compliance with diet and medication stressed and she vocalized understanding. 2. Congestive heart failure education was given to the patient at extensive length.  Diet was discussed she weighs herself on a regular basis she is a very intelligent woman.  She is a Marine scientist by profession. 3. She will have a Chem-7 today. 4. Her blood pressure is borderline therefore she is not on another medication such as Entresto or ACE inhibitors.  Given this blood pressure is of concern to her as she feels tired at the end of the day. 5. I discussed with her about electrophysiological evaluation for possible defibrillator therapy and also the possibility of having a coronary event monitor to assess her rhythm.  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  Risks of not doing the above procedures were discussed and she understood and she does not want to pursue this route at this time.  I respect her wishes.  I told her that if she changes her mind to let us know. 6. Patient will be seen in follow-up appointment in 6 months or earlier if the patient has any concerns.  She will have an echocardiogram about 1 to 2 weeks before she sees me next to follow and assess her left ventricular systolic function.   Medication Adjustments/Labs and Tests Ordered: Current medicines are reviewed at length with the patient today.  Concerns regarding medicines are outlined above.  No orders of the defined types were placed in this encounter.  No orders  of the defined types were placed in this encounter.    No chief complaint on file.    History of Present Illness:    Alexis Stephens is a 82 y.o. female.  Patient has past medical history of lymphoma for which she received chemotherapy.  Subsequently she was evaluated by our colleagues and found to have low ejection fraction in the 20s.  Stress test did not reveal any evidence of ischemia.  Subsequently she is on beta-blockers and diuretic and is doing fine.  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  Chest pain orthopnea PND dizziness or any palpitations.  At the time of my evaluation, the patient is alert awake oriented and in no distress.  Past Medical History:  Diagnosis Date  . Cataract   . GERD (gastroesophageal reflux disease)   . Hymenoptera allergy   . Swelling     Past Surgical History:  Procedure Laterality Date  . ABDOMINAL HYSTERECTOMY    . ADENOIDECTOMY    . APPENDECTOMY    . CHOLECYSTECTOMY    . EYE SURGERY    . NEPHROLITHOTOMY Left   . TONSILLECTOMY      Current Medications: Current Meds  Medication Sig  . Acetaminophen (TYLENOL PO) Take by mouth as needed.  . carvedilol (COREG) 6.25 MG tablet Take 1 tablet (6.25 mg total) by mouth 2 (two) times daily.  . cyclobenzaprine (FLEXERIL) 5 MG tablet Take 1 tablet  by mouth as needed.  Marland Kitchen EPINEPHrine (EPIPEN 2-PAK) 0.3 mg/0.3 mL IJ SOAJ injection Inject 0.3 mg into the muscle once.  . furosemide (LASIX) 20 MG tablet Take 1 tablet (20 mg total) by mouth daily.  . Multiple Vitamin (MULTIVITAMIN) capsule Take by mouth.  . potassium chloride (K-DUR) 10 MEQ tablet Take 1 tablet (10 mEq total) by mouth daily.     Allergies:   Ivp dye [iodinated diagnostic agents]; Mixed vespid venom; Wasp venom; Bee venom; Furadantin [nitrofurantoin]; Ibuprofen; Iodine; Iodine-131; Magnesium citrate; Penicillamine; Penicillins; Sulfa antibiotics; and Sulfasalazine   Social History   Socioeconomic  History  . Marital status: Widowed    Spouse name: Not on file  . Number of children: Not on file  . Years of education: Not on file  . Highest education level: Not on file  Occupational History  . Not on file  Social Needs  . Financial resource strain: Not on file  . Food insecurity:    Worry: Not on file    Inability: Not on file  . Transportation needs:    Medical: Not on file    Non-medical: Not on file  Tobacco Use  . Smoking status: Former Smoker    Last attempt to quit: 08/24/1993    Years since quitting: 24.4  . Smokeless tobacco: Never Used  Substance and Sexual Activity  . Alcohol use: Not on file  . Drug use: Not on file  . Sexual activity: Not on file  Lifestyle  . Physical activity:    Days per week: Not on file    Minutes per session: Not on file  . Stress: Not on file  Relationships  . Social connections:    Talks on phone: Not on file    Gets together: Not on file    Attends religious service: Not on file    Active member of club or organization: Not on file    Attends meetings of clubs or organizations: Not on file    Relationship status: Not on file  Other Topics Concern  . Not on file  Social History Narrative  . Not on file     Family History: The patient's family history includes Cancer in her brother; Osteoarthritis in her mother; Other in her father; Rheum arthritis in her mother.  ROS:   Please see the history of present illness.    All other systems reviewed and are negative.  EKGs/Labs/Other Studies Reviewed:    The following studies were reviewed today: Echocardiogram has revealed ejection fraction of 30 to 35% which is improvement from the previous echocardiogram.   Recent Labs: 11/25/2017: BUN 22; Creatinine, Ser 1.29; Magnesium 2.2; NT-Pro BNP 3,191; Potassium 4.9; Sodium 137  Recent Lipid Panel No results found for: CHOL, TRIG, HDL, CHOLHDL, VLDL, LDLCALC, LDLDIRECT  Physical Exam:    VS:  BP 105/60 (BP Location: Right Arm,  Patient Position: Sitting, Cuff Size: Normal)   Pulse 92   Ht 5\' 1"  (1.549 m)   Wt 154 lb (69.9 kg)   SpO2 97%   BMI 29.10 kg/m     Wt Readings from Last 3 Encounters:  01/20/18 154 lb (69.9 kg)  11/25/17 148 lb (67.1 kg)  08/25/15 189 lb 6 oz (85.9 kg)     GEN: Patient is in no acute distress HEENT: Normal NECK: No JVD; No carotid bruits LYMPHATICS: No lymphadenopathy CARDIAC: Hear sounds regular, 2/6 systolic murmur at the apex. RESPIRATORY:  Clear to auscultation without rales, wheezing or rhonchi  ABDOMEN: Soft,  non-tender, non-distended MUSCULOSKELETAL:  No edema; No deformity  SKIN: Warm and dry NEUROLOGIC:  Alert and oriented x 3 PSYCHIATRIC:  Normal affect   Signed, Jenean Lindau, MD  01/20/2018 11:09 AM    [place]

## 2018-01-20 NOTE — Patient Instructions (Addendum)
Medication Instructions:  Your physician recommends that you continue on your current medications as directed. Please refer to the Current Medication list given to you today.  If you need a refill on your cardiac medications before your next appointment, please call your pharmacy.   Lab work: Your physician recommends that you have the following labs drawn: BMP to be done today  If you have labs (blood work) drawn today and your tests are completely normal, you will receive your results only by: Marland Kitchen MyChart Message (if you have MyChart) OR . A paper copy in the mail If you have any lab test that is abnormal or we need to change your treatment, we will call you to review the results.  Testing/Procedures: None  Follow-Up: At [facility], you and your health needs are our priority.  As part of our continuing mission to provide you with exceptional heart care, we have created designated Provider Care Teams.  These Care Teams include your primary Cardiologist (physician) and Advanced Practice Providers (APPs -  Physician Assistants and Nurse Practitioners) who all work together to provide you with the care you need, when you need it.  You will need a follow up appointment in 6 months.  Please call our office 2 months in advance to schedule this appointment.  You may see another member of our [employer] Provider Team in [place]: [name], MD . Shirlee More, MD  Any Other Special Instructions Will Be Listed Below (If Applicable).

## 2018-01-23 ENCOUNTER — Telehealth: Payer: Self-pay

## 2018-01-23 NOTE — Telephone Encounter (Signed)
Patient was called and notified of results. 

## 2018-01-23 NOTE — Telephone Encounter (Signed)
-----   Message from Jenean Lindau, MD sent at 01/20/2018  5:00 PM EST ----- The results of the study is unremarkable. Please inform patient. I will discuss in detail at next appointment. Cc  primary care/referring physician Jenean Lindau, MD 01/20/2018 5:00 PM

## 2018-01-30 ENCOUNTER — Other Ambulatory Visit: Payer: Self-pay | Admitting: Cardiology

## 2018-01-31 DIAGNOSIS — Z6828 Body mass index (BMI) 28.0-28.9, adult: Secondary | ICD-10-CM | POA: Diagnosis not present

## 2018-01-31 DIAGNOSIS — Z1331 Encounter for screening for depression: Secondary | ICD-10-CM | POA: Diagnosis not present

## 2018-01-31 DIAGNOSIS — Z79899 Other long term (current) drug therapy: Secondary | ICD-10-CM | POA: Diagnosis not present

## 2018-01-31 DIAGNOSIS — K219 Gastro-esophageal reflux disease without esophagitis: Secondary | ICD-10-CM | POA: Diagnosis not present

## 2018-01-31 DIAGNOSIS — D510 Vitamin B12 deficiency anemia due to intrinsic factor deficiency: Secondary | ICD-10-CM | POA: Diagnosis not present

## 2018-02-08 DIAGNOSIS — D510 Vitamin B12 deficiency anemia due to intrinsic factor deficiency: Secondary | ICD-10-CM | POA: Diagnosis not present

## 2018-02-16 DIAGNOSIS — D510 Vitamin B12 deficiency anemia due to intrinsic factor deficiency: Secondary | ICD-10-CM | POA: Diagnosis not present

## 2018-02-20 ENCOUNTER — Ambulatory Visit: Payer: Medicare Other | Admitting: Cardiology

## 2018-02-23 DIAGNOSIS — D510 Vitamin B12 deficiency anemia due to intrinsic factor deficiency: Secondary | ICD-10-CM | POA: Diagnosis not present

## 2018-02-27 ENCOUNTER — Ambulatory Visit (INDEPENDENT_AMBULATORY_CARE_PROVIDER_SITE_OTHER): Payer: Medicare Other | Admitting: *Deleted

## 2018-02-27 ENCOUNTER — Ambulatory Visit (INDEPENDENT_AMBULATORY_CARE_PROVIDER_SITE_OTHER): Payer: Medicare Other | Admitting: Cardiology

## 2018-02-27 ENCOUNTER — Encounter: Payer: Self-pay | Admitting: Cardiology

## 2018-02-27 ENCOUNTER — Telehealth: Payer: Self-pay | Admitting: Cardiology

## 2018-02-27 VITALS — BP 94/62 | HR 107 | Ht 60.0 in | Wt 158.4 lb

## 2018-02-27 DIAGNOSIS — I5022 Chronic systolic (congestive) heart failure: Secondary | ICD-10-CM | POA: Diagnosis not present

## 2018-02-27 DIAGNOSIS — R002 Palpitations: Secondary | ICD-10-CM

## 2018-02-27 DIAGNOSIS — IMO0001 Reserved for inherently not codable concepts without codable children: Secondary | ICD-10-CM

## 2018-02-27 DIAGNOSIS — T63441D Toxic effect of venom of bees, accidental (unintentional), subsequent encounter: Secondary | ICD-10-CM

## 2018-02-27 DIAGNOSIS — I428 Other cardiomyopathies: Secondary | ICD-10-CM

## 2018-02-27 MED ORDER — CARVEDILOL PHOSPHATE ER 10 MG PO CP24: 10.0000 mg | ORAL_CAPSULE | Freq: Every day | ORAL | 1 refills | Status: DC

## 2018-02-27 NOTE — Patient Instructions (Addendum)
Medication Instructions:  Your physician has recommended you make the following change in your medication:   Stop: carvedilol  Start: coreg cr 10 mg daily   If you need a refill on your cardiac medications before your next appointment, please call your pharmacy.   Lab work: None  If you have labs (blood work) drawn today and your tests are completely normal, you will receive your results only by: Marland Kitchen MyChart Message (if you have MyChart) OR . A paper copy in the mail If you have any lab test that is abnormal or we need to change your treatment, we will call you to review the results.  Testing/Procedures: None.   Follow-Up: At [facility], you and your health needs are our priority.  As part of our continuing mission to provide you with exceptional heart care, we have created designated Provider Care Teams.  These Care Teams include your primary Cardiologist (physician) and Advanced Practice Providers (APPs -  Physician Assistants and Nurse Practitioners) who all work together to provide you with the care you need, when you need it. You will need a follow up appointment in 6 weeks.  Please call our office 2 months in advance to schedule this appointment.  You may see No primary care provider on file. or another member of our [employer] Provider Team in : Shirlee More, MD . Jyl Heinz, MD  Any Other Special Instructions Will Be Listed Below (If Applicable).  Carvedilol oral capsule, extended release What is this medicine? CARVEDILOL (KAR ve dil ol) is a beta-blocker. Beta-blockers reduce the workload on the heart and help it beat more regularly. This medicine is used to treat high blood pressure, heart failure, and heart problems after a heart attack. This medicine may be used for other purposes; ask your health care provider or pharmacist if you have questions. COMMON BRAND NAME(S): Coreg CR What should I tell my health care provider before I take this medicine? They  need to know if you have any of these conditions: -circulation problems -diabetes -history of heart attack or heart disease -liver disease -lung or breathing disease, like asthma or emphysema -pheochromocytoma -slow or irregular heartbeat -thyroid disease -an unusual or allergic reaction to carvedilol, other beta-blockers, medicines, foods, dyes, or preservatives -pregnant or trying to get pregnant -breast-feeding How should I use this medicine? Take this medicine by mouth with a glass of water. Follow the directions on the prescription label. Take with food. Do not cut, crush or chew this medicine. Take your doses at regular intervals. Do not take your medicine more often than directed. Do not stop taking except on your doctor's advice. Talk to your pediatrician regarding the use of this medicine in children. Special care may be needed. Overdosage: If you think you have taken too much of this medicine contact a poison control center or emergency room at once. NOTE: This medicine is only for you. Do not share this medicine with others. What if I miss a dose? If you miss a dose, take it as soon as you can. If it is almost time for your next dose, take only that dose. Do not take double or extra doses. What may interact with this medicine? This medicine may interact with the following medications: -certain medicines for blood pressure, heart disease, irregular heart beat -certain medicines for depression, like fluoxetine or paroxetine -certain medicines for diabetes, like glipizide or glyburide -cimetidine -clonidine -cyclosporine -digoxin -MAOIs like Carbex, Eldepryl, Marplan, Nardil, and Parnate -reserpine -rifampin This list may  not describe all possible interactions. Give your health care provider a list of all the medicines, herbs, non-prescription drugs, or dietary supplements you use. Also tell them if you smoke, drink alcohol, or use illegal drugs. Some items may interact with  your medicine. What should I watch for while using this medicine? Check your heart rate and blood pressure regularly while you are taking this medicine. Ask your doctor or health care professional what your heart rate and blood pressure should be, and when you should contact him or her. Do not stop taking this medicine suddenly. This could lead to serious heart-related effects. Contact your doctor or health care professional if you have difficulty breathing while taking this drug. Check your weight daily. Ask your doctor or health care professional when you should notify him/her of any weight gain. You may get drowsy or dizzy. Do not drive, use machinery, or do anything that requires mental alertness until you know how this medicine affects you. To reduce the risk of dizzy or fainting spells, do not sit or stand up quickly. Alcohol can make you more drowsy, and increase flushing and rapid heartbeats. Avoid alcoholic drinks. If you have diabetes, check your blood sugar as directed. Tell your doctor if you have changes in your blood sugar while you are taking this medicine. If you are going to have surgery, tell your doctor or health care professional that you are taking this medicine. What side effects may I notice from receiving this medicine? Side effects that you should report to your doctor or health care professional as soon as possible: -allergic reactions like skin rash, itching or hives, swelling of the face, lips, or tongue -breathing problems -dark urine -slow or irregular heartbeat -swollen legs or ankles -vomiting -yellowing of the eyes or skin Side effects that usually do not require medical attention (report to your doctor or health care professional if they continue or are bothersome): -change in sex drive or performance -diarrhea -dry eyes (especially if wearing contact lenses) -dry, itching skin -headache -nausea -unusually tired This list may not describe all possible side  effects. Call your doctor for medical advice about side effects. You may report side effects to FDA at 1-800-FDA-1088. Where should I keep my medicine? Keep out of the reach of children. Store at room temperature between 15 and 30 degrees C (59 and 86 degrees F). Protect from moisture. Keep container tightly closed. Throw away any unused medicine after the expiration date. NOTE: This sheet is a summary. It may not cover all possible information. If you have questions about this medicine, talk to your doctor, pharmacist, or health care provider.  2019 Elsevier/Gold Standard (2012-10-22 14:07:32)

## 2018-02-27 NOTE — Progress Notes (Signed)
Cardiology Office Note:    Date:  02/27/2018   ID:  Alexis Stephens, DOB 1935-05-02, MRN [id number]  PCP:  Alexis Sheriff, MD  Cardiologist:  Alexis Campus, MD    Referring MD: Alexis Sheriff, MD   Chief Complaint  Patient presents with  . 2nd opinion  Doing well  History of Present Illness:    Alexis Stephens is a 82 y.o. female with nonischemic cardiomyopathy ejection fraction 30 to 35% based on latest echocardiogram.  Stress test was done which was negative.  Comes today to discuss care issues.  She described the fact that when she takes Coreg after that she feels very weak tired and exhausted and truly the biggest limitation terms of medication we can use to improve her cardiomyopathy is low blood pressure.  We discussed option in this situation and I suggested to change her to long-acting form of Coreg Coreg CR to see if we might can gets more smoother distribution of this medication and by doing this I hope to prevent this post medication or weakness.  She is not on any ACE inhibitor or Entresto because of kidney dysfunction and low blood pressure I hope in the future will be able to discontinue diuretic and then put her on small dose of Entresto or ARB.  We also discussed the issue of ICD her ejection fraction 30 to 35% this is nonischemic cardiomyopathy her [place] is 2 she is very reluctant to consider this I think we can try to improve her medical therapy and then see if there is any improvement in her left ventricular ejection fraction.  Past Medical History:  Diagnosis Date  . Cancer [facility])    Stage 4 Non-Hodgkin Lymphoma, clear for the past year  . Cataract   . GERD (gastroesophageal reflux disease)   . Hymenoptera allergy   . Swelling     Past Surgical History:  Procedure Laterality Date  . ABDOMINAL HYSTERECTOMY    . ADENOIDECTOMY    . APPENDECTOMY    . CHOLECYSTECTOMY    . EYE SURGERY    . NEPHROLITHOTOMY Left   . TONSILLECTOMY        Current Medications: Current Meds  Medication Sig  . Acetaminophen (TYLENOL PO) Take by mouth as needed.  . carvedilol (COREG) 6.25 MG tablet Take 1 tablet (6.25 mg total) by mouth 2 (two) times daily.  . cyclobenzaprine (FLEXERIL) 5 MG tablet Take 1 tablet by mouth as needed.  Marland Kitchen EPINEPHrine (EPIPEN 2-PAK) 0.3 mg/0.3 mL IJ SOAJ injection Inject 0.3 mg into the muscle once.  . furosemide (LASIX) 20 MG tablet TAKE 1 TABLET(20 MG) BY MOUTH DAILY  . Multiple Vitamin (MULTIVITAMIN) capsule Take by mouth.  . potassium chloride (K-DUR) 10 MEQ tablet Take 1 tablet (10 mEq total) by mouth daily.     Allergies:   Ivp dye [iodinated diagnostic agents]; Mixed vespid venom; Wasp venom; Bee venom; Furadantin [nitrofurantoin]; Ibuprofen; Iodine; Iodine-131; Magnesium citrate; Penicillamine; Penicillins; Sulfa antibiotics; and Sulfasalazine   Social History   Socioeconomic History  . Marital status: Widowed    Spouse name: Not on file  . Number of children: Not on file  . Years of education: Not on file  . Highest education level: Not on file  Occupational History  . Not on file  Social Needs  . Financial resource strain: Not on file  . Food insecurity:    Worry: Not on file    Inability: Not on file  . Transportation  needs:    Medical: Not on file    Non-medical: Not on file  Tobacco Use  . Smoking status: Former Smoker    Last attempt to quit: 08/24/1993    Years since quitting: 24.5  . Smokeless tobacco: Never Used  Substance and Sexual Activity  . Alcohol use: Yes    Comment: occasional  . Drug use: Never  . Sexual activity: Not on file  Lifestyle  . Physical activity:    Days per week: Not on file    Minutes per session: Not on file  . Stress: Not on file  Relationships  . Social connections:    Talks on phone: Not on file    Gets together: Not on file    Attends religious service: Not on file    Active member of club or organization: Not on file    Attends meetings of  clubs or organizations: Not on file    Relationship status: Not on file  Other Topics Concern  . Not on file  Social History Narrative  . Not on file     Family History: The patient's family history includes Cancer in her brother; Osteoarthritis in her mother; Other in her father; Rheum arthritis in her mother. ROS:   Please see the history of present illness.    All 14 point review of systems negative except as described per history of present illness  EKGs/Labs/Other Studies Reviewed:      Recent Labs: 11/25/2017: Magnesium 2.2; NT-Pro BNP 3,191 01/20/2018: BUN 20; Creatinine, Ser 1.23; Potassium 4.1; Sodium 140  Recent Lipid Panel No results found for: CHOL, TRIG, HDL, CHOLHDL, VLDL, LDLCALC, LDLDIRECT  Physical Exam:    VS:  BP 94/62   Pulse (!) 107   Ht 5' (1.524 m)   Wt 158 lb 6.4 oz (71.8 kg)   SpO2 98%   BMI 30.94 kg/m     Wt Readings from Last 3 Encounters:  02/27/18 158 lb 6.4 oz (71.8 kg)  01/20/18 154 lb (69.9 kg)  11/25/17 148 lb (67.1 kg)     GEN:  Well nourished, well developed in no acute distress HEENT: Normal NECK: No JVD; No carotid bruits LYMPHATICS: No lymphadenopathy CARDIAC: RRR, no murmurs, no rubs, no gallops RESPIRATORY:  Clear to auscultation without rales, wheezing or rhonchi  ABDOMEN: Soft, non-tender, non-distended MUSCULOSKELETAL:  No edema; No deformity  SKIN: Warm and dry LOWER EXTREMITIES: no swelling NEUROLOGIC:  Alert and oriented x 3 PSYCHIATRIC:  Normal affect   ASSESSMENT:    1. Chronic systolic heart failure ([place])   2. Nonischemic cardiomyopathy ([facility])   3. Palpitations    PLAN:    In order of problems listed above:  1. Chronic systolic congestive heart failure plan as outlined above.  She is compensated.  She weighed herself she knows about salt restriction as well as fluid restriction 2. Nonischemic cardiomyopathy: Stable 3. Palpitations denies having any   Medication Adjustments/Labs and Tests  Ordered: Current medicines are reviewed at length with the patient today.  Concerns regarding medicines are outlined above.  No orders of the defined types were placed in this encounter.  Medication changes: No orders of the defined types were placed in this encounter.   Signed, Alexis Liter, MD, [facility] 02/27/2018 11:04 AM    [place]

## 2018-02-27 NOTE — Telephone Encounter (Signed)
Unable to contact patient will continue efforts

## 2018-02-27 NOTE — Addendum Note (Signed)
Addended by: Linna Hoff R on: 02/27/2018 11:16 AM   Modules accepted: Orders

## 2018-02-27 NOTE — Telephone Encounter (Signed)
Her coreg is too expensive

## 2018-02-28 MED ORDER — CARVEDILOL 6.25 MG PO TABS: 6.2500 mg | ORAL_TABLET | Freq: Two times a day (BID) | ORAL | 1 refills | Status: DC

## 2018-02-28 NOTE — Telephone Encounter (Signed)
Per Dr. Agustin Cree patient is advised to stop carvedilol extended release and advised to continue carvedilol 6.25 mg twice daily. Patient verbally understands.

## 2018-02-28 NOTE — Telephone Encounter (Signed)
Patient is calling because her coreg extended release is over $200 and she cannot afford this. Patient wants to continue her regular coreg 6.25mg  twice daily rather than switching to extended release. Will inform Dr. Agustin Cree and get back with patient.

## 2018-02-28 NOTE — Addendum Note (Signed)
Addended by: Ashok Norris on: 02/28/2018 02:22 PM   Modules accepted: Orders

## 2018-03-24 DIAGNOSIS — D510 Vitamin B12 deficiency anemia due to intrinsic factor deficiency: Secondary | ICD-10-CM | POA: Diagnosis not present

## 2018-04-07 DIAGNOSIS — C8335 Diffuse large B-cell lymphoma, lymph nodes of inguinal region and lower limb: Secondary | ICD-10-CM | POA: Diagnosis not present

## 2018-04-10 ENCOUNTER — Encounter: Payer: Self-pay | Admitting: Cardiology

## 2018-04-10 ENCOUNTER — Ambulatory Visit (INDEPENDENT_AMBULATORY_CARE_PROVIDER_SITE_OTHER): Payer: Medicare Other | Admitting: Cardiology

## 2018-04-10 VITALS — BP 116/60 | HR 80 | Ht 60.0 in | Wt 158.4 lb

## 2018-04-10 DIAGNOSIS — R002 Palpitations: Secondary | ICD-10-CM | POA: Diagnosis not present

## 2018-04-10 DIAGNOSIS — I5022 Chronic systolic (congestive) heart failure: Secondary | ICD-10-CM | POA: Diagnosis not present

## 2018-04-10 DIAGNOSIS — I428 Other cardiomyopathies: Secondary | ICD-10-CM | POA: Diagnosis not present

## 2018-04-10 DIAGNOSIS — R0609 Other forms of dyspnea: Secondary | ICD-10-CM

## 2018-04-10 MED ORDER — LOSARTAN POTASSIUM 25 MG PO TABS: 25.0000 mg | ORAL_TABLET | Freq: Every day | ORAL | 1 refills | Status: DC

## 2018-04-10 NOTE — Patient Instructions (Signed)
Medication Instructions:  Your physician has recommended you make the following change in your medication:   Start: Losartan 25 mg daily   If you need a refill on your cardiac medications before your next appointment, please call your pharmacy.   Lab work: Your physician recommends that you return for lab work in 1 week: Bmp   If you have labs (blood work) drawn today and your tests are completely normal, you will receive your results only by: Marland Kitchen MyChart Message (if you have MyChart) OR . A paper copy in the mail If you have any lab test that is abnormal or we need to change your treatment, we will call you to review the results.  Testing/Procedures: None.   Follow-Up: At [facility], you and your health needs are our priority.  As part of our continuing mission to provide you with exceptional heart care, we have created designated Provider Care Teams.  These Care Teams include your primary Cardiologist (physician) and Advanced Practice Providers (APPs -  Physician Assistants and Nurse Practitioners) who all work together to provide you with the care you need, when you need it. You will need a follow up appointment in 2 months.  Please call our office 2 months in advance to schedule this appointment.  You may see No primary care provider on file. or another member of our [employer] Provider Team in [place]: Shirlee More, MD . Jyl Heinz, MD  Any Other Special Instructions Will Be Listed Below (If Applicable).  Losartan tablets What is this medicine? LOSARTAN (loe SAR tan) is used to treat high blood pressure and to reduce the risk of stroke in certain patients. This drug also slows the progression of kidney disease in patients with diabetes. This medicine may be used for other purposes; ask your health care provider or pharmacist if you have questions. COMMON BRAND NAME(S): Cozaar What should I tell my health care provider before I take this medicine? They need to know if  you have any of these conditions: -heart failure -kidney or liver disease -an unusual or allergic reaction to losartan, other medicines, foods, dyes, or preservatives -pregnant or trying to get pregnant -breast-feeding How should I use this medicine? Take this medicine by mouth with a glass of water. Follow the directions on the prescription label. This medicine can be taken with or without food. Take your doses at regular intervals. Do not take your medicine more often than directed. Talk to your pediatrician regarding the use of this medicine in children. Special care may be needed. Overdosage: If you think you have taken too much of this medicine contact a poison control center or emergency room at once. NOTE: This medicine is only for you. Do not share this medicine with others. What if I miss a dose? If you miss a dose, take it as soon as you can. If it is almost time for your next dose, take only that dose. Do not take double or extra doses. What may interact with this medicine? -blood pressure medicines -diuretics, especially triamterene, spironolactone, or amiloride -fluconazole -NSAIDs, medicines for pain and inflammation, like ibuprofen or naproxen -potassium salts or potassium supplements -rifampin This list may not describe all possible interactions. Give your health care provider a list of all the medicines, herbs, non-prescription drugs, or dietary supplements you use. Also tell them if you smoke, drink alcohol, or use illegal drugs. Some items may interact with your medicine. What should I watch for while using this medicine? Visit your doctor  or health care professional for regular checks on your progress. Check your blood pressure as directed. Ask your doctor or health care professional what your blood pressure should be and when you should contact him or her. Call your doctor or health care professional if you notice an irregular or fast heart beat. Women should inform their  doctor if they wish to become pregnant or think they might be pregnant. There is a potential for serious side effects to an unborn child, particularly in the second or third trimester. Talk to your health care professional or pharmacist for more information. You may get drowsy or dizzy. Do not drive, use machinery, or do anything that needs mental alertness until you know how this drug affects you. Do not stand or sit up quickly, especially if you are an older patient. This reduces the risk of dizzy or fainting spells. Alcohol can make you more drowsy and dizzy. Avoid alcoholic drinks. Avoid salt substitutes unless you are told otherwise by your doctor or health care professional. Do not treat yourself for coughs, colds, or pain while you are taking this medicine without asking your doctor or health care professional for advice. Some ingredients may increase your blood pressure. What side effects may I notice from receiving this medicine? Side effects that you should report to your doctor or health care professional as soon as possible: -confusion, dizziness, light headedness or fainting spells -decreased amount of urine passed -difficulty breathing or swallowing, hoarseness, or tightening of the throat -fast or irregular heart beat, palpitations, or chest pain -skin rash, itching -swelling of your face, lips, tongue, hands, or feet Side effects that usually do not require medical attention (report to your doctor or health care professional if they continue or are bothersome): -cough -decreased sexual function or desire -headache -nasal congestion or stuffiness -nausea or stomach pain -sore or cramping muscles This list may not describe all possible side effects. Call your doctor for medical advice about side effects. You may report side effects to FDA at 1-800-FDA-1088. Where should I keep my medicine? Keep out of the reach of children. Store at room temperature between 15 and 30 degrees C (59  and 86 degrees F). Protect from light. Keep container tightly closed. Throw away any unused medicine after the expiration date. NOTE: This sheet is a summary. It may not cover all possible information. If you have questions about this medicine, talk to your doctor, pharmacist, or health care provider.  2019 Elsevier/Gold Standard (2007-04-28 16:42:18)

## 2018-04-10 NOTE — Progress Notes (Signed)
Cardiology Office Note:    Date:  04/10/2018   ID:  Alexis Stephens, DOB 1935/12/29, MRN [id number]  PCP:  [name], MD  Cardiologist:  [name], MD    Referring MD: [name], MD   Chief Complaint  Patient presents with  . 6 weeks follow up  Doing well  History of Present Illness:    Alexis Stephens is a 83 y.o. female with cardiomyopathy ejection fraction 30 to 35%.  To have difficulty putting her on the right medications.  She is tolerating 6.25 carvedilol twice daily with some difficulties but overall seems to be doing well from that point review.  Last creatinine one half on her is 1.1 with upper limits of normal 1.04 therefore mildly elevated I will try to put her on very small dose of losartan it will be only 25 mg daily.  Chem-7 will be checked within the next few days make sure creatinine does not increase too much.  After that we will repeat echocardiogram continue discussion about ICD.  I talked to her about ICD today she prefers to try to augment her medication if she is able to tolerated then recheck ejection fraction.  Past Medical History:  Diagnosis Date  . Cancer [facility])    Stage 4 Non-Hodgkin Lymphoma, clear for the past year  . Cataract   . GERD (gastroesophageal reflux disease)   . Hymenoptera allergy   . Swelling     Past Surgical History:  Procedure Laterality Date  . ABDOMINAL HYSTERECTOMY    . ADENOIDECTOMY    . APPENDECTOMY    . CHOLECYSTECTOMY    . EYE SURGERY    . NEPHROLITHOTOMY Left   . TONSILLECTOMY      Current Medications: Current Meds  Medication Sig  . Acetaminophen (TYLENOL PO) Take by mouth as needed.  . carvedilol (COREG) 6.25 MG tablet Take 1 tablet (6.25 mg total) by mouth 2 (two) times daily.  . cyclobenzaprine (FLEXERIL) 5 MG tablet Take 1 tablet by mouth as needed.  Marland Kitchen EPINEPHrine (EPIPEN 2-PAK) 0.3 mg/0.3 mL IJ SOAJ injection Inject 0.3 mg into the muscle once.  . furosemide (LASIX) 20 MG tablet TAKE  1 TABLET(20 MG) BY MOUTH DAILY  . Multiple Vitamin (MULTIVITAMIN) capsule Take by mouth.  . potassium chloride (K-DUR) 10 MEQ tablet Take 1 tablet (10 mEq total) by mouth daily.     Allergies:   Ivp dye [iodinated diagnostic agents]; Mixed vespid venom; Wasp venom; Bee venom; Furadantin [nitrofurantoin]; Ibuprofen; Iodine; Iodine-131; Magnesium citrate; Penicillamine; Penicillins; Sulfa antibiotics; and Sulfasalazine   Social History   Socioeconomic History  . Marital status: Widowed    Spouse name: Not on file  . Number of children: Not on file  . Years of education: Not on file  . Highest education level: Not on file  Occupational History  . Not on file  Social Needs  . Financial resource strain: Not on file  . Food insecurity:    Worry: Not on file    Inability: Not on file  . Transportation needs:    Medical: Not on file    Non-medical: Not on file  Tobacco Use  . Smoking status: Former Smoker    Last attempt to quit: 08/24/1993    Years since quitting: 24.6  . Smokeless tobacco: Never Used  Substance and Sexual Activity  . Alcohol use: Yes    Comment: occasional  . Drug use: Never  . Sexual activity: Not on file  Lifestyle  .  Physical activity:    Days per week: Not on file    Minutes per session: Not on file  . Stress: Not on file  Relationships  . Social connections:    Talks on phone: Not on file    Gets together: Not on file    Attends religious service: Not on file    Active member of club or organization: Not on file    Attends meetings of clubs or organizations: Not on file    Relationship status: Not on file  Other Topics Concern  . Not on file  Social History Narrative  . Not on file     Family History: The patient's family history includes Cancer in her brother; Osteoarthritis in her mother; Other in her father; Rheum arthritis in her mother. ROS:   Please see the history of present illness.    All 14 point review of systems negative except as  described per history of present illness  EKGs/Labs/Other Studies Reviewed:      Recent Labs: 11/25/2017: Magnesium 2.2; NT-Pro BNP 3,191 01/20/2018: BUN 20; Creatinine, Ser 1.23; Potassium 4.1; Sodium 140  Recent Lipid Panel No results found for: CHOL, TRIG, HDL, CHOLHDL, VLDL, LDLCALC, LDLDIRECT  Physical Exam:    VS:  BP 116/60   Pulse 80   Ht 5' (1.524 m)   Wt 158 lb 6.4 oz (71.8 kg)   SpO2 (!) 12%   BMI 30.94 kg/m     Wt Readings from Last 3 Encounters:  04/10/18 158 lb 6.4 oz (71.8 kg)  02/27/18 158 lb 6.4 oz (71.8 kg)  01/20/18 154 lb (69.9 kg)     GEN:  Well nourished, well developed in no acute distress HEENT: Normal NECK: No JVD; No carotid bruits LYMPHATICS: No lymphadenopathy CARDIAC: RRR, no murmurs, no rubs, no gallops RESPIRATORY:  Clear to auscultation without rales, wheezing or rhonchi  ABDOMEN: Soft, non-tender, non-distended MUSCULOSKELETAL:  No edema; No deformity  SKIN: Warm and dry LOWER EXTREMITIES: no swelling NEUROLOGIC:  Alert and oriented x 3 PSYCHIATRIC:  Normal affect   ASSESSMENT:    1. Nonischemic cardiomyopathy ([place])   2. Chronic systolic heart failure (HCC)   3. Palpitations   4. Dyspnea on exertion    PLAN:    In order of problems listed above:  1. Nonischemic cardiomyopathy plan as outlined above. 2. Congestive heart for appears to be compensated continue medications and will try to add small dose of losartan. 3. Palpitations denies having any. 4. Dyspnea on exertion as usual   Medication Adjustments/Labs and Tests Ordered: Current medicines are reviewed at length with the patient today.  Concerns regarding medicines are outlined above.  No orders of the defined types were placed in this encounter.  Medication changes: No orders of the defined types were placed in this encounter.   Signed, Park Liter, MD, [facility] 04/10/2018 10:50 AM    [place]

## 2018-04-19 DIAGNOSIS — R002 Palpitations: Secondary | ICD-10-CM | POA: Diagnosis not present

## 2018-04-19 DIAGNOSIS — I5022 Chronic systolic (congestive) heart failure: Secondary | ICD-10-CM | POA: Diagnosis not present

## 2018-04-19 DIAGNOSIS — I428 Other cardiomyopathies: Secondary | ICD-10-CM | POA: Diagnosis not present

## 2018-04-20 LAB — BASIC METABOLIC PANEL
BUN/Creatinine Ratio: 16 (ref 12–28)
BUN: 17 mg/dL (ref 8–27)
CO2: 20 mmol/L (ref 20–29)
Calcium: 9.6 mg/dL (ref 8.7–10.3)
Chloride: 106 mmol/L (ref 96–106)
Creatinine, Ser: 1.08 mg/dL — ABNORMAL HIGH (ref 0.57–1.00)
GFR, EST AFRICAN AMERICAN: 56 mL/min/{1.73_m2} — AB (ref >59)
GFR, EST NON AFRICAN AMERICAN: 49 mL/min/{1.73_m2} — AB (ref >59)
Glucose: 86 mg/dL (ref 65–99)
POTASSIUM: 4.2 mmol/L (ref 3.5–5.2)
SODIUM: 143 mmol/L (ref 134–144)

## 2018-04-20 LAB — SPECIMEN STATUS REPORT

## 2018-04-24 ENCOUNTER — Ambulatory Visit (INDEPENDENT_AMBULATORY_CARE_PROVIDER_SITE_OTHER): Payer: Medicare Other | Admitting: *Deleted

## 2018-04-24 DIAGNOSIS — D519 Vitamin B12 deficiency anemia, unspecified: Secondary | ICD-10-CM | POA: Diagnosis not present

## 2018-04-24 DIAGNOSIS — T63441D Toxic effect of venom of bees, accidental (unintentional), subsequent encounter: Secondary | ICD-10-CM | POA: Diagnosis not present

## 2018-04-24 DIAGNOSIS — IMO0001 Reserved for inherently not codable concepts without codable children: Secondary | ICD-10-CM

## 2018-06-05 ENCOUNTER — Telehealth: Payer: Self-pay

## 2018-06-05 NOTE — Telephone Encounter (Signed)
Contacted patient and discussed her up coming appointment on 4/10. Advised that the office is trying to keep patient out of the office due to COVID-19 and asked if it would be okay to change her to a telephone visit. She stated that since she did not want to come in due to being a cancer survivor and 66, she would like to do a telephone visit.

## 2018-06-06 ENCOUNTER — Telehealth: Payer: Self-pay | Admitting: Cardiology

## 2018-06-06 NOTE — Telephone Encounter (Signed)
Virtual Visit Pre-Appointment Phone Call  Steps For Call:  1. Confirm consent - "In the setting of the current Covid19 crisis, you are scheduled for a (phone or video) visit with your provider on (date) at (time).  Just as we do with many in-office visits, in order for you to participate in this visit, we must obtain consent.  If you'd like, I can send this to your mychart (if signed up) or email for you to review.  Otherwise, I can obtain your verbal consent now.  All virtual visits are billed to your insurance company just like a normal visit would be.  By agreeing to a virtual visit, we'd like you to understand that the technology does not allow for your provider to perform an examination, and thus may limit your provider's ability to fully assess your condition.  Finally, though the technology is pretty good, we cannot assure that it will always work on either your or our end, and in the setting of a video visit, we may have to convert it to a phone-only visit.  In either situation, we cannot ensure that we have a secure connection.  Are you willing to proceed?"  2. Give patient instructions for WebEx download to smartphone as below if video visit  3. Advise patient to be prepared with any vital sign or heart rhythm information, their current medicines, and a piece of paper and pen handy for any instructions they may receive the day of their visit  4. Inform patient they will receive a phone call 15 minutes prior to their appointment time (may be from unknown caller ID) so they should be prepared to answer  5. Confirm that appointment type is correct in Epic appointment notes (video vs telephone)    TELEPHONE CALL NOTE  Maryn Freelove has been deemed a candidate for a follow-up tele-health visit to limit community exposure during the Covid-19 pandemic. I spoke with the patient via phone to ensure availability of phone/video source, confirm preferred email & phone number, and discuss  instructions and expectations.  I reminded Jackalynn Art to be prepared with any vital sign and/or heart rhythm information that could potentially be obtained via home monitoring, at the time of her visit. I reminded Ray Glacken to expect a phone call at the time of her visit if her visit.  Did the patient verbally acknowledge consent to treatment? YES/verbal consent  Isaiah Blakes 06/06/2018 11:58 AM   DOWNLOADING THE [place]  - If Apple, go to [employer] and type in WebEx in the search bar. [place] [employer], the blue/green circle. The app is free but as with any other app downloads, their phone may require them to verify saved payment information or Apple password. The patient does NOT have to create an account.  - If Android, ask patient to go to [employer] and type in WebEx in the search bar. [place] [employer], the blue/green circle. The app is free but as with any other app downloads, their phone may require them to verify saved payment information or Android password. The patient does NOT have to create an account.   CONSENT FOR TELE-HEALTH VISIT - PLEASE REVIEW  I hereby voluntarily request, consent and authorize [facility] and its employed or contracted physicians, physician assistants, nurse practitioners or other licensed health care professionals (the Practitioner), to provide me with telemedicine health care services (the Services") as deemed necessary by the treating Practitioner. I acknowledge and  consent to receive the Services by the Practitioner via telemedicine. I understand that the telemedicine visit will involve communicating with the Practitioner through live audiovisual communication technology and the disclosure of certain medical information by electronic transmission. I acknowledge that I have been given the opportunity to request an in-person assessment or other available alternative prior to the  telemedicine visit and am voluntarily participating in the telemedicine visit.  I understand that I have the right to withhold or withdraw my consent to the use of telemedicine in the course of my care at any time, without affecting my right to future care or treatment, and that the Practitioner or I may terminate the telemedicine visit at any time. I understand that I have the right to inspect all information obtained and/or recorded in the course of the telemedicine visit and may receive copies of available information for a reasonable fee.  I understand that some of the potential risks of receiving the Services via telemedicine include:   Delay or interruption in medical evaluation due to technological equipment failure or disruption;  Information transmitted may not be sufficient (e.g. poor resolution of images) to allow for appropriate medical decision making by the Practitioner; and/or   In rare instances, security protocols could fail, causing a breach of personal health information.  Furthermore, I acknowledge that it is my responsibility to provide information about my medical history, conditions and care that is complete and accurate to the best of my ability. I acknowledge that Practitioner's advice, recommendations, and/or decision may be based on factors not within their control, such as incomplete or inaccurate data provided by me or distortions of diagnostic images or specimens that may result from electronic transmissions. I understand that the practice of medicine is not an exact science and that Practitioner makes no warranties or guarantees regarding treatment outcomes. I acknowledge that I will receive a copy of this consent concurrently upon execution via email to the email address I last provided but may also request a printed copy by calling the office of [facility].    I understand that my insurance will be billed for this visit.   I have read or had this consent read to  me.  I understand the contents of this consent, which adequately explains the benefits and risks of the Services being provided via telemedicine.   I have been provided ample opportunity to ask questions regarding this consent and the Services and have had my questions answered to my satisfaction.  I give my informed consent for the services to be provided through the use of telemedicine in my medical care  By participating in this telemedicine visit I agree to the above.

## 2018-06-09 ENCOUNTER — Other Ambulatory Visit: Payer: Self-pay

## 2018-06-09 ENCOUNTER — Encounter: Payer: Self-pay | Admitting: Cardiology

## 2018-06-09 ENCOUNTER — Telehealth (INDEPENDENT_AMBULATORY_CARE_PROVIDER_SITE_OTHER): Payer: Medicare Other | Admitting: Cardiology

## 2018-06-09 VITALS — BP 110/66 | HR 92 | Wt 154.0 lb

## 2018-06-09 DIAGNOSIS — I428 Other cardiomyopathies: Secondary | ICD-10-CM | POA: Diagnosis not present

## 2018-06-09 DIAGNOSIS — R000 Tachycardia, unspecified: Secondary | ICD-10-CM

## 2018-06-09 DIAGNOSIS — R0609 Other forms of dyspnea: Secondary | ICD-10-CM

## 2018-06-09 NOTE — Patient Instructions (Signed)
Medication Instructions:  Your physician recommends that you continue on your current medications as directed. Please refer to the Current Medication list given to you today.  If you need a refill on your cardiac medications before your next appointment, please call your pharmacy.   Lab work: None.  If you have labs (blood work) drawn today and your tests are completely normal, you will receive your results only by: . MyChart Message (if you have MyChart) OR . A paper copy in the mail If you have any lab test that is abnormal or we need to change your treatment, we will call you to review the results.  Testing/Procedures: None.   Follow-Up: At [facility], you and your health needs are our priority.  As part of our continuing mission to provide you with exceptional heart care, we have created designated Provider Care Teams.  These Care Teams include your primary Cardiologist (physician) and Advanced Practice Providers (APPs -  Physician Assistants and Nurse Practitioners) who all work together to provide you with the care you need, when you need it. You will need a follow up appointment in 3 months.  Please call our office 2 months in advance to schedule this appointment.  You may see No primary care provider on file. or another member of our [facility] Provider Team in [place]: Brian Munley, MD . Rajan Revankar, MD  Any Other Special Instructions Will Be Listed Below (If Applicable).     

## 2018-06-09 NOTE — Progress Notes (Signed)
Virtual Visit via Telephone Note   This visit type was conducted due to national recommendations for restrictions regarding the COVID-19 Pandemic (e.g. social distancing) in an effort to limit this patient's exposure and mitigate transmission in our community.  Due to her co-morbid illnesses, this patient is at least at moderate risk for complications without adequate follow up.  This format is felt to be most appropriate for this patient at this time.  The patient did not have access to video technology/had technical difficulties with video requiring transitioning to audio format only (telephone).  All issues noted in this document were discussed and addressed.  No physical exam could be performed with this format.  Please refer to the patient's chart for her  consent to telehealth for [facility].  Evaluation Performed:  Follow-up visit  This visit type was conducted due to national recommendations for restrictions regarding the COVID-19 Pandemic (e.g. social distancing).  This format is felt to be most appropriate for this patient at this time.  All issues noted in this document were discussed and addressed.  No physical exam was performed (except for noted visual exam findings with Video Visits).  Please refer to the patient's chart (MyChart message for video visits and phone note for telephone visits) for the patient's consent to telehealth for [facility].  Date:  06/09/2018  ID: Alexis Stephens, DOB 1935-10-04, MRN [id number]   Patient Location:  [address] [place] [address]   Provider location:   [place] [facility]  PCP:  [name], MD  Cardiologist:  [name], MD     Chief Complaint: Doing well  History of Present Illness:    Alexis Stephens is a 83 y.o. female  who presents via audio/video conferencing for a telehealth visit today.  With cardiomyopathy ejection fraction 30 to 35%.  We doing tele-visit today because coronavirus  situation overall she says she is doing well denies any chest pain tightness squeezing pressure burning chest.  She is happy the way she feels.  Last time I put her on losartan luckily she is able to tolerate it.  She is taking beta-blocker as well as losartan.  I wanted to increase the dose of medication today but she prefers not to do it scared of coronavirus situation.  And to truly see if we can increase dose of losartan I would have to bring her back to the office and have her Chem-7 rechecked.  She prefers not to do it right now she said she feels fine there is no dizziness no passing out.  There is no chest pain.  Overall she is well.  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   The patient does not have symptoms concerning for COVID-19 infection (fever, chills, cough, or new SHORTNESS OF BREATH).    Prior CV studies:   The following studies were reviewed today:  Echocardiogram from 01/06/2018 showed:   Impressions:  - 1. Moderate to severe depression in systolic function. Largely   unchanged from previous exam.   2. Mild mitral and aortic regurgitation.   3. Aortic sclerosis.   Past Medical History:  Diagnosis Date  . Cancer [facility])    Stage 4 Non-Hodgkin Lymphoma, clear for the past year  . Cataract   . GERD (gastroesophageal reflux disease)   . Hymenoptera allergy   . Swelling     Past Surgical History:  Procedure Laterality Date  .  ABDOMINAL HYSTERECTOMY    . ADENOIDECTOMY    . APPENDECTOMY    . CHOLECYSTECTOMY    . EYE SURGERY    . NEPHROLITHOTOMY Left   . TONSILLECTOMY       Current Meds  Medication Sig  . Acetaminophen (TYLENOL PO) Take by mouth as needed.  . carvedilol (COREG) 6.25 MG tablet Take 1 tablet (6.25 mg total) by mouth 2 (two) times daily.  . cyclobenzaprine (FLEXERIL) 5 MG tablet Take 1 tablet by mouth as needed.  Marland Kitchen EPINEPHrine (EPIPEN 2-PAK) 0.3 mg/0.3 mL IJ SOAJ injection Inject 0.3 mg into the  muscle once.  . furosemide (LASIX) 20 MG tablet TAKE 1 TABLET(20 MG) BY MOUTH DAILY  . losartan (COZAAR) 25 MG tablet Take 1 tablet (25 mg total) by mouth daily.  . Multiple Vitamin (MULTIVITAMIN) capsule Take by mouth.  . potassium chloride (K-DUR) 10 MEQ tablet Take 1 tablet (10 mEq total) by mouth daily.      Family History: The patient's family history includes Cancer in her brother; Osteoarthritis in her mother; Other in her father; Rheum arthritis in her mother.   ROS:   Please see the history of present illness.     All other systems reviewed and are negative.   Labs/Other Tests and Data Reviewed:     Recent Labs: 11/25/2017: Magnesium 2.2; NT-Pro BNP 3,191 04/19/2018: BUN 17; Creatinine, Ser 1.08; Potassium 4.2; Sodium 143  Recent Lipid Panel No results found for: CHOL, TRIG, HDL, CHOLHDL, VLDL, LDLCALC, LDLDIRECT    Exam:    Vital Signs:  BP 110/66   Pulse 92   Wt 154 lb (69.9 kg)   BMI 30.08 kg/m     Wt Readings from Last 3 Encounters:  06/09/18 154 lb (69.9 kg)  04/10/18 158 lb 6.4 oz (71.8 kg)  02/27/18 158 lb 6.4 oz (71.8 kg)     Well nourished, well developed female in no acute distress. Alert awake oriented x3 not in any distress.  Quite nice and cheerful conversation.  Diagnosis for this visit:   1. Nonischemic cardiomyopathy ([place])   2. Dyspnea on exertion   3. Tachycardia      ASSESSMENT & PLAN:    1.  Nonischemic cardiomyopathy on beta-blocker and ARB.  The goal was to increase dosages of this medication she does not want to do it.  She is feeling well she said that she can continue present medications.  I did review her laboratory test all were good. 2.  Dyspnea exertion denies having any. 3.  Tachycardia doing well from that point review denies having any.  Overall she is doing well we will continue present management.  We had discussion before about ICD she said she will think about it.  COVID-19 Education: The signs and symptoms of  COVID-19 were discussed with the patient and how to seek care for testing (follow up with PCP or arrange E-visit).  The importance of social distancing was discussed today.  Patient Risk:   After full review of this patients clinical status, I feel that they are at least moderate risk at this time.  Time:   Today, I have spent 17 minutes with the patient with telehealth technology discussing pt health issues. Visit was finished at 10:10 AM.    Medication Adjustments/Labs and Tests Ordered: Current medicines are reviewed at length with the patient today.  Concerns regarding medicines are outlined above.  No orders of the defined types were placed in this encounter.  Medication changes: No orders  of the defined types were placed in this encounter.    Disposition: Follow-up 3 months  Signed, Park Liter, MD, [facility] 06/09/2018 10:07 AM    [place]

## 2018-06-19 ENCOUNTER — Other Ambulatory Visit: Payer: Self-pay

## 2018-06-19 ENCOUNTER — Ambulatory Visit (INDEPENDENT_AMBULATORY_CARE_PROVIDER_SITE_OTHER): Payer: Medicare Other | Admitting: *Deleted

## 2018-06-19 DIAGNOSIS — T63441D Toxic effect of venom of bees, accidental (unintentional), subsequent encounter: Secondary | ICD-10-CM | POA: Diagnosis not present

## 2018-06-19 DIAGNOSIS — IMO0001 Reserved for inherently not codable concepts without codable children: Secondary | ICD-10-CM

## 2018-07-29 DIAGNOSIS — D519 Vitamin B12 deficiency anemia, unspecified: Secondary | ICD-10-CM | POA: Diagnosis not present

## 2018-07-29 DIAGNOSIS — M810 Age-related osteoporosis without current pathological fracture: Secondary | ICD-10-CM | POA: Diagnosis not present

## 2018-08-14 ENCOUNTER — Ambulatory Visit: Payer: Self-pay

## 2018-08-16 ENCOUNTER — Ambulatory Visit (INDEPENDENT_AMBULATORY_CARE_PROVIDER_SITE_OTHER): Payer: Medicare Other | Admitting: *Deleted

## 2018-08-16 DIAGNOSIS — IMO0001 Reserved for inherently not codable concepts without codable children: Secondary | ICD-10-CM

## 2018-08-16 DIAGNOSIS — T63441D Toxic effect of venom of bees, accidental (unintentional), subsequent encounter: Secondary | ICD-10-CM | POA: Diagnosis not present

## 2018-09-18 ENCOUNTER — Telehealth: Payer: Medicare Other | Admitting: Cardiology

## 2018-09-18 ENCOUNTER — Other Ambulatory Visit: Payer: Self-pay

## 2018-09-29 DIAGNOSIS — Z20828 Contact with and (suspected) exposure to other viral communicable diseases: Secondary | ICD-10-CM | POA: Diagnosis not present

## 2018-10-06 DIAGNOSIS — C8335 Diffuse large B-cell lymphoma, lymph nodes of inguinal region and lower limb: Secondary | ICD-10-CM | POA: Diagnosis not present

## 2018-10-09 ENCOUNTER — Ambulatory Visit (INDEPENDENT_AMBULATORY_CARE_PROVIDER_SITE_OTHER): Payer: Medicare Other | Admitting: *Deleted

## 2018-10-09 DIAGNOSIS — IMO0001 Reserved for inherently not codable concepts without codable children: Secondary | ICD-10-CM

## 2018-10-09 DIAGNOSIS — T63441D Toxic effect of venom of bees, accidental (unintentional), subsequent encounter: Secondary | ICD-10-CM | POA: Diagnosis not present

## 2018-11-02 DIAGNOSIS — Z23 Encounter for immunization: Secondary | ICD-10-CM | POA: Diagnosis not present

## 2018-11-29 DIAGNOSIS — R635 Abnormal weight gain: Secondary | ICD-10-CM | POA: Diagnosis not present

## 2018-11-29 DIAGNOSIS — D519 Vitamin B12 deficiency anemia, unspecified: Secondary | ICD-10-CM | POA: Diagnosis not present

## 2018-11-29 DIAGNOSIS — M810 Age-related osteoporosis without current pathological fracture: Secondary | ICD-10-CM | POA: Diagnosis not present

## 2018-11-29 DIAGNOSIS — Z9181 History of falling: Secondary | ICD-10-CM | POA: Diagnosis not present

## 2018-11-29 DIAGNOSIS — Z6830 Body mass index (BMI) 30.0-30.9, adult: Secondary | ICD-10-CM | POA: Diagnosis not present

## 2018-11-29 DIAGNOSIS — Z1331 Encounter for screening for depression: Secondary | ICD-10-CM | POA: Diagnosis not present

## 2018-11-29 DIAGNOSIS — M1990 Unspecified osteoarthritis, unspecified site: Secondary | ICD-10-CM | POA: Diagnosis not present

## 2018-11-29 DIAGNOSIS — D510 Vitamin B12 deficiency anemia due to intrinsic factor deficiency: Secondary | ICD-10-CM | POA: Diagnosis not present

## 2018-12-04 ENCOUNTER — Ambulatory Visit (INDEPENDENT_AMBULATORY_CARE_PROVIDER_SITE_OTHER): Payer: Medicare Other | Admitting: *Deleted

## 2018-12-04 ENCOUNTER — Other Ambulatory Visit: Payer: Self-pay

## 2018-12-04 DIAGNOSIS — T63441D Toxic effect of venom of bees, accidental (unintentional), subsequent encounter: Secondary | ICD-10-CM | POA: Diagnosis not present

## 2018-12-04 DIAGNOSIS — IMO0001 Reserved for inherently not codable concepts without codable children: Secondary | ICD-10-CM

## 2018-12-05 DIAGNOSIS — Z79899 Other long term (current) drug therapy: Secondary | ICD-10-CM | POA: Diagnosis not present

## 2018-12-07 DIAGNOSIS — L814 Other melanin hyperpigmentation: Secondary | ICD-10-CM | POA: Diagnosis not present

## 2018-12-07 DIAGNOSIS — C44629 Squamous cell carcinoma of skin of left upper limb, including shoulder: Secondary | ICD-10-CM | POA: Diagnosis not present

## 2018-12-11 DIAGNOSIS — M6281 Muscle weakness (generalized): Secondary | ICD-10-CM | POA: Diagnosis not present

## 2018-12-11 DIAGNOSIS — M25561 Pain in right knee: Secondary | ICD-10-CM | POA: Diagnosis not present

## 2018-12-11 DIAGNOSIS — M1990 Unspecified osteoarthritis, unspecified site: Secondary | ICD-10-CM | POA: Diagnosis not present

## 2018-12-11 DIAGNOSIS — R2681 Unsteadiness on feet: Secondary | ICD-10-CM | POA: Diagnosis not present

## 2018-12-11 DIAGNOSIS — R5383 Other fatigue: Secondary | ICD-10-CM | POA: Diagnosis not present

## 2018-12-11 DIAGNOSIS — M25562 Pain in left knee: Secondary | ICD-10-CM | POA: Diagnosis not present

## 2018-12-11 DIAGNOSIS — R2689 Other abnormalities of gait and mobility: Secondary | ICD-10-CM | POA: Diagnosis not present

## 2018-12-11 DIAGNOSIS — M25571 Pain in right ankle and joints of right foot: Secondary | ICD-10-CM | POA: Diagnosis not present

## 2018-12-11 DIAGNOSIS — M25611 Stiffness of right shoulder, not elsewhere classified: Secondary | ICD-10-CM | POA: Diagnosis not present

## 2018-12-11 DIAGNOSIS — M25511 Pain in right shoulder: Secondary | ICD-10-CM | POA: Diagnosis not present

## 2018-12-11 DIAGNOSIS — M25612 Stiffness of left shoulder, not elsewhere classified: Secondary | ICD-10-CM | POA: Diagnosis not present

## 2018-12-11 DIAGNOSIS — M25572 Pain in left ankle and joints of left foot: Secondary | ICD-10-CM | POA: Diagnosis not present

## 2018-12-11 DIAGNOSIS — R293 Abnormal posture: Secondary | ICD-10-CM | POA: Diagnosis not present

## 2018-12-13 DIAGNOSIS — M810 Age-related osteoporosis without current pathological fracture: Secondary | ICD-10-CM | POA: Diagnosis not present

## 2018-12-19 DIAGNOSIS — M25561 Pain in right knee: Secondary | ICD-10-CM | POA: Diagnosis not present

## 2018-12-19 DIAGNOSIS — M25571 Pain in right ankle and joints of right foot: Secondary | ICD-10-CM | POA: Diagnosis not present

## 2018-12-19 DIAGNOSIS — M25511 Pain in right shoulder: Secondary | ICD-10-CM | POA: Diagnosis not present

## 2018-12-19 DIAGNOSIS — M25562 Pain in left knee: Secondary | ICD-10-CM | POA: Diagnosis not present

## 2018-12-19 DIAGNOSIS — M25572 Pain in left ankle and joints of left foot: Secondary | ICD-10-CM | POA: Diagnosis not present

## 2018-12-19 DIAGNOSIS — M1990 Unspecified osteoarthritis, unspecified site: Secondary | ICD-10-CM | POA: Diagnosis not present

## 2018-12-21 DIAGNOSIS — M1990 Unspecified osteoarthritis, unspecified site: Secondary | ICD-10-CM | POA: Diagnosis not present

## 2018-12-21 DIAGNOSIS — M25511 Pain in right shoulder: Secondary | ICD-10-CM | POA: Diagnosis not present

## 2018-12-21 DIAGNOSIS — M25561 Pain in right knee: Secondary | ICD-10-CM | POA: Diagnosis not present

## 2018-12-21 DIAGNOSIS — M25571 Pain in right ankle and joints of right foot: Secondary | ICD-10-CM | POA: Diagnosis not present

## 2018-12-21 DIAGNOSIS — M25572 Pain in left ankle and joints of left foot: Secondary | ICD-10-CM | POA: Diagnosis not present

## 2018-12-21 DIAGNOSIS — M25562 Pain in left knee: Secondary | ICD-10-CM | POA: Diagnosis not present

## 2018-12-26 DIAGNOSIS — M25511 Pain in right shoulder: Secondary | ICD-10-CM | POA: Diagnosis not present

## 2018-12-26 DIAGNOSIS — M1990 Unspecified osteoarthritis, unspecified site: Secondary | ICD-10-CM | POA: Diagnosis not present

## 2018-12-26 DIAGNOSIS — M25572 Pain in left ankle and joints of left foot: Secondary | ICD-10-CM | POA: Diagnosis not present

## 2018-12-26 DIAGNOSIS — M25562 Pain in left knee: Secondary | ICD-10-CM | POA: Diagnosis not present

## 2018-12-26 DIAGNOSIS — M25571 Pain in right ankle and joints of right foot: Secondary | ICD-10-CM | POA: Diagnosis not present

## 2018-12-26 DIAGNOSIS — M25561 Pain in right knee: Secondary | ICD-10-CM | POA: Diagnosis not present

## 2019-01-29 ENCOUNTER — Ambulatory Visit: Payer: Self-pay

## 2019-01-31 ENCOUNTER — Ambulatory Visit (INDEPENDENT_AMBULATORY_CARE_PROVIDER_SITE_OTHER): Payer: Medicare Other

## 2019-01-31 DIAGNOSIS — T63441D Toxic effect of venom of bees, accidental (unintentional), subsequent encounter: Secondary | ICD-10-CM | POA: Diagnosis not present

## 2019-01-31 DIAGNOSIS — IMO0001 Reserved for inherently not codable concepts without codable children: Secondary | ICD-10-CM

## 2019-03-07 ENCOUNTER — Other Ambulatory Visit: Payer: Self-pay | Admitting: Cardiology

## 2019-03-07 MED ORDER — LOSARTAN POTASSIUM 25 MG PO TABS: 25.0000 mg | ORAL_TABLET | Freq: Every day | ORAL | 1 refills | Status: DC

## 2019-03-07 NOTE — Addendum Note (Signed)
Addended by: Beckey Rutter on: 03/07/2019 04:23 PM   Modules accepted: Orders

## 2019-03-07 NOTE — Telephone Encounter (Signed)
Call [facility]in to [facility]

## 2019-03-08 DIAGNOSIS — M25561 Pain in right knee: Secondary | ICD-10-CM | POA: Diagnosis not present

## 2019-03-08 DIAGNOSIS — M25562 Pain in left knee: Secondary | ICD-10-CM | POA: Diagnosis not present

## 2019-03-08 DIAGNOSIS — M25511 Pain in right shoulder: Secondary | ICD-10-CM | POA: Diagnosis not present

## 2019-03-08 DIAGNOSIS — M6281 Muscle weakness (generalized): Secondary | ICD-10-CM | POA: Diagnosis not present

## 2019-03-08 DIAGNOSIS — R2689 Other abnormalities of gait and mobility: Secondary | ICD-10-CM | POA: Diagnosis not present

## 2019-03-08 DIAGNOSIS — R2681 Unsteadiness on feet: Secondary | ICD-10-CM | POA: Diagnosis not present

## 2019-03-08 DIAGNOSIS — R5383 Other fatigue: Secondary | ICD-10-CM | POA: Diagnosis not present

## 2019-03-08 DIAGNOSIS — R293 Abnormal posture: Secondary | ICD-10-CM | POA: Diagnosis not present

## 2019-03-08 DIAGNOSIS — M25571 Pain in right ankle and joints of right foot: Secondary | ICD-10-CM | POA: Diagnosis not present

## 2019-03-08 DIAGNOSIS — M25611 Stiffness of right shoulder, not elsewhere classified: Secondary | ICD-10-CM | POA: Diagnosis not present

## 2019-03-08 DIAGNOSIS — M1990 Unspecified osteoarthritis, unspecified site: Secondary | ICD-10-CM | POA: Diagnosis not present

## 2019-03-08 DIAGNOSIS — M25612 Stiffness of left shoulder, not elsewhere classified: Secondary | ICD-10-CM | POA: Diagnosis not present

## 2019-03-08 DIAGNOSIS — M25572 Pain in left ankle and joints of left foot: Secondary | ICD-10-CM | POA: Diagnosis not present

## 2019-03-13 DIAGNOSIS — M25562 Pain in left knee: Secondary | ICD-10-CM | POA: Diagnosis not present

## 2019-03-13 DIAGNOSIS — M25571 Pain in right ankle and joints of right foot: Secondary | ICD-10-CM | POA: Diagnosis not present

## 2019-03-13 DIAGNOSIS — M25572 Pain in left ankle and joints of left foot: Secondary | ICD-10-CM | POA: Diagnosis not present

## 2019-03-13 DIAGNOSIS — M25511 Pain in right shoulder: Secondary | ICD-10-CM | POA: Diagnosis not present

## 2019-03-13 DIAGNOSIS — M25561 Pain in right knee: Secondary | ICD-10-CM | POA: Diagnosis not present

## 2019-03-13 DIAGNOSIS — M1990 Unspecified osteoarthritis, unspecified site: Secondary | ICD-10-CM | POA: Diagnosis not present

## 2019-03-20 ENCOUNTER — Other Ambulatory Visit: Payer: Self-pay | Admitting: Cardiology

## 2019-03-20 ENCOUNTER — Other Ambulatory Visit: Payer: Self-pay

## 2019-03-20 DIAGNOSIS — M25561 Pain in right knee: Secondary | ICD-10-CM | POA: Diagnosis not present

## 2019-03-20 DIAGNOSIS — M25572 Pain in left ankle and joints of left foot: Secondary | ICD-10-CM | POA: Diagnosis not present

## 2019-03-20 DIAGNOSIS — M25571 Pain in right ankle and joints of right foot: Secondary | ICD-10-CM | POA: Diagnosis not present

## 2019-03-20 DIAGNOSIS — M1990 Unspecified osteoarthritis, unspecified site: Secondary | ICD-10-CM | POA: Diagnosis not present

## 2019-03-20 DIAGNOSIS — M25562 Pain in left knee: Secondary | ICD-10-CM | POA: Diagnosis not present

## 2019-03-20 DIAGNOSIS — M25511 Pain in right shoulder: Secondary | ICD-10-CM | POA: Diagnosis not present

## 2019-03-20 MED ORDER — FUROSEMIDE 20 MG PO TABS: ORAL_TABLET | ORAL | 0 refills | Status: DC

## 2019-03-22 DIAGNOSIS — H40003 Preglaucoma, unspecified, bilateral: Secondary | ICD-10-CM | POA: Diagnosis not present

## 2019-03-22 DIAGNOSIS — M25561 Pain in right knee: Secondary | ICD-10-CM | POA: Diagnosis not present

## 2019-03-22 DIAGNOSIS — M25511 Pain in right shoulder: Secondary | ICD-10-CM | POA: Diagnosis not present

## 2019-03-22 DIAGNOSIS — M1990 Unspecified osteoarthritis, unspecified site: Secondary | ICD-10-CM | POA: Diagnosis not present

## 2019-03-22 DIAGNOSIS — M25571 Pain in right ankle and joints of right foot: Secondary | ICD-10-CM | POA: Diagnosis not present

## 2019-03-22 DIAGNOSIS — M25572 Pain in left ankle and joints of left foot: Secondary | ICD-10-CM | POA: Diagnosis not present

## 2019-03-22 DIAGNOSIS — M25562 Pain in left knee: Secondary | ICD-10-CM | POA: Diagnosis not present

## 2019-03-28 ENCOUNTER — Ambulatory Visit: Payer: Self-pay

## 2019-04-02 ENCOUNTER — Ambulatory Visit (INDEPENDENT_AMBULATORY_CARE_PROVIDER_SITE_OTHER): Payer: Medicare Other | Admitting: *Deleted

## 2019-04-02 DIAGNOSIS — T63441D Toxic effect of venom of bees, accidental (unintentional), subsequent encounter: Secondary | ICD-10-CM

## 2019-04-02 DIAGNOSIS — IMO0001 Reserved for inherently not codable concepts without codable children: Secondary | ICD-10-CM

## 2019-04-04 DIAGNOSIS — R5383 Other fatigue: Secondary | ICD-10-CM | POA: Diagnosis not present

## 2019-04-04 DIAGNOSIS — R2681 Unsteadiness on feet: Secondary | ICD-10-CM | POA: Diagnosis not present

## 2019-04-04 DIAGNOSIS — R2689 Other abnormalities of gait and mobility: Secondary | ICD-10-CM | POA: Diagnosis not present

## 2019-04-04 DIAGNOSIS — M25572 Pain in left ankle and joints of left foot: Secondary | ICD-10-CM | POA: Diagnosis not present

## 2019-04-04 DIAGNOSIS — M25612 Stiffness of left shoulder, not elsewhere classified: Secondary | ICD-10-CM | POA: Diagnosis not present

## 2019-04-04 DIAGNOSIS — M6281 Muscle weakness (generalized): Secondary | ICD-10-CM | POA: Diagnosis not present

## 2019-04-04 DIAGNOSIS — R293 Abnormal posture: Secondary | ICD-10-CM | POA: Diagnosis not present

## 2019-04-04 DIAGNOSIS — M25561 Pain in right knee: Secondary | ICD-10-CM | POA: Diagnosis not present

## 2019-04-04 DIAGNOSIS — M25511 Pain in right shoulder: Secondary | ICD-10-CM | POA: Diagnosis not present

## 2019-04-04 DIAGNOSIS — M1990 Unspecified osteoarthritis, unspecified site: Secondary | ICD-10-CM | POA: Diagnosis not present

## 2019-04-04 DIAGNOSIS — M25611 Stiffness of right shoulder, not elsewhere classified: Secondary | ICD-10-CM | POA: Diagnosis not present

## 2019-04-04 DIAGNOSIS — M25571 Pain in right ankle and joints of right foot: Secondary | ICD-10-CM | POA: Diagnosis not present

## 2019-04-04 DIAGNOSIS — M25562 Pain in left knee: Secondary | ICD-10-CM | POA: Diagnosis not present

## 2019-04-09 DIAGNOSIS — C8335 Diffuse large B-cell lymphoma, lymph nodes of inguinal region and lower limb: Secondary | ICD-10-CM

## 2019-04-09 DIAGNOSIS — Z8579 Personal history of other malignant neoplasms of lymphoid, hematopoietic and related tissues: Secondary | ICD-10-CM | POA: Diagnosis not present

## 2019-04-11 DIAGNOSIS — M25561 Pain in right knee: Secondary | ICD-10-CM | POA: Diagnosis not present

## 2019-04-11 DIAGNOSIS — M1990 Unspecified osteoarthritis, unspecified site: Secondary | ICD-10-CM | POA: Diagnosis not present

## 2019-04-11 DIAGNOSIS — M25562 Pain in left knee: Secondary | ICD-10-CM | POA: Diagnosis not present

## 2019-04-11 DIAGNOSIS — M25511 Pain in right shoulder: Secondary | ICD-10-CM | POA: Diagnosis not present

## 2019-04-11 DIAGNOSIS — M25572 Pain in left ankle and joints of left foot: Secondary | ICD-10-CM | POA: Diagnosis not present

## 2019-04-11 DIAGNOSIS — M25571 Pain in right ankle and joints of right foot: Secondary | ICD-10-CM | POA: Diagnosis not present

## 2019-04-25 DIAGNOSIS — M25562 Pain in left knee: Secondary | ICD-10-CM | POA: Diagnosis not present

## 2019-04-25 DIAGNOSIS — M25571 Pain in right ankle and joints of right foot: Secondary | ICD-10-CM | POA: Diagnosis not present

## 2019-04-25 DIAGNOSIS — M25511 Pain in right shoulder: Secondary | ICD-10-CM | POA: Diagnosis not present

## 2019-04-25 DIAGNOSIS — M25572 Pain in left ankle and joints of left foot: Secondary | ICD-10-CM | POA: Diagnosis not present

## 2019-04-25 DIAGNOSIS — M25561 Pain in right knee: Secondary | ICD-10-CM | POA: Diagnosis not present

## 2019-04-25 DIAGNOSIS — M1990 Unspecified osteoarthritis, unspecified site: Secondary | ICD-10-CM | POA: Diagnosis not present

## 2019-05-04 DIAGNOSIS — M1990 Unspecified osteoarthritis, unspecified site: Secondary | ICD-10-CM | POA: Diagnosis not present

## 2019-05-04 DIAGNOSIS — R2681 Unsteadiness on feet: Secondary | ICD-10-CM | POA: Diagnosis not present

## 2019-05-04 DIAGNOSIS — R5383 Other fatigue: Secondary | ICD-10-CM | POA: Diagnosis not present

## 2019-05-04 DIAGNOSIS — M6281 Muscle weakness (generalized): Secondary | ICD-10-CM | POA: Diagnosis not present

## 2019-05-04 DIAGNOSIS — R293 Abnormal posture: Secondary | ICD-10-CM | POA: Diagnosis not present

## 2019-05-04 DIAGNOSIS — M25611 Stiffness of right shoulder, not elsewhere classified: Secondary | ICD-10-CM | POA: Diagnosis not present

## 2019-05-04 DIAGNOSIS — M25511 Pain in right shoulder: Secondary | ICD-10-CM | POA: Diagnosis not present

## 2019-05-04 DIAGNOSIS — M25612 Stiffness of left shoulder, not elsewhere classified: Secondary | ICD-10-CM | POA: Diagnosis not present

## 2019-05-04 DIAGNOSIS — M25561 Pain in right knee: Secondary | ICD-10-CM | POA: Diagnosis not present

## 2019-05-04 DIAGNOSIS — M25562 Pain in left knee: Secondary | ICD-10-CM | POA: Diagnosis not present

## 2019-05-04 DIAGNOSIS — M25571 Pain in right ankle and joints of right foot: Secondary | ICD-10-CM | POA: Diagnosis not present

## 2019-05-04 DIAGNOSIS — M25572 Pain in left ankle and joints of left foot: Secondary | ICD-10-CM | POA: Diagnosis not present

## 2019-05-04 DIAGNOSIS — R2689 Other abnormalities of gait and mobility: Secondary | ICD-10-CM | POA: Diagnosis not present

## 2019-05-07 ENCOUNTER — Other Ambulatory Visit: Payer: Self-pay | Admitting: Cardiology

## 2019-05-08 ENCOUNTER — Other Ambulatory Visit: Payer: Self-pay | Admitting: Cardiology

## 2019-05-08 DIAGNOSIS — M25562 Pain in left knee: Secondary | ICD-10-CM | POA: Diagnosis not present

## 2019-05-08 DIAGNOSIS — M25572 Pain in left ankle and joints of left foot: Secondary | ICD-10-CM | POA: Diagnosis not present

## 2019-05-08 DIAGNOSIS — M25511 Pain in right shoulder: Secondary | ICD-10-CM | POA: Diagnosis not present

## 2019-05-08 DIAGNOSIS — M1990 Unspecified osteoarthritis, unspecified site: Secondary | ICD-10-CM | POA: Diagnosis not present

## 2019-05-08 DIAGNOSIS — M25571 Pain in right ankle and joints of right foot: Secondary | ICD-10-CM | POA: Diagnosis not present

## 2019-05-08 DIAGNOSIS — M25561 Pain in right knee: Secondary | ICD-10-CM | POA: Diagnosis not present

## 2019-05-14 DIAGNOSIS — M25572 Pain in left ankle and joints of left foot: Secondary | ICD-10-CM | POA: Diagnosis not present

## 2019-05-14 DIAGNOSIS — M25571 Pain in right ankle and joints of right foot: Secondary | ICD-10-CM | POA: Diagnosis not present

## 2019-05-14 DIAGNOSIS — M25562 Pain in left knee: Secondary | ICD-10-CM | POA: Diagnosis not present

## 2019-05-14 DIAGNOSIS — M25511 Pain in right shoulder: Secondary | ICD-10-CM | POA: Diagnosis not present

## 2019-05-14 DIAGNOSIS — Z1231 Encounter for screening mammogram for malignant neoplasm of breast: Secondary | ICD-10-CM | POA: Diagnosis not present

## 2019-05-14 DIAGNOSIS — M25561 Pain in right knee: Secondary | ICD-10-CM | POA: Diagnosis not present

## 2019-05-14 DIAGNOSIS — M1990 Unspecified osteoarthritis, unspecified site: Secondary | ICD-10-CM | POA: Diagnosis not present

## 2019-05-22 DIAGNOSIS — M1990 Unspecified osteoarthritis, unspecified site: Secondary | ICD-10-CM | POA: Diagnosis not present

## 2019-05-22 DIAGNOSIS — M25562 Pain in left knee: Secondary | ICD-10-CM | POA: Diagnosis not present

## 2019-05-22 DIAGNOSIS — M25561 Pain in right knee: Secondary | ICD-10-CM | POA: Diagnosis not present

## 2019-05-22 DIAGNOSIS — M25572 Pain in left ankle and joints of left foot: Secondary | ICD-10-CM | POA: Diagnosis not present

## 2019-05-22 DIAGNOSIS — M25571 Pain in right ankle and joints of right foot: Secondary | ICD-10-CM | POA: Diagnosis not present

## 2019-05-22 DIAGNOSIS — M25511 Pain in right shoulder: Secondary | ICD-10-CM | POA: Diagnosis not present

## 2019-05-24 DIAGNOSIS — M25571 Pain in right ankle and joints of right foot: Secondary | ICD-10-CM | POA: Diagnosis not present

## 2019-05-24 DIAGNOSIS — M1990 Unspecified osteoarthritis, unspecified site: Secondary | ICD-10-CM | POA: Diagnosis not present

## 2019-05-24 DIAGNOSIS — M25511 Pain in right shoulder: Secondary | ICD-10-CM | POA: Diagnosis not present

## 2019-05-24 DIAGNOSIS — M25562 Pain in left knee: Secondary | ICD-10-CM | POA: Diagnosis not present

## 2019-05-24 DIAGNOSIS — M25561 Pain in right knee: Secondary | ICD-10-CM | POA: Diagnosis not present

## 2019-05-24 DIAGNOSIS — M25572 Pain in left ankle and joints of left foot: Secondary | ICD-10-CM | POA: Diagnosis not present

## 2019-05-28 ENCOUNTER — Other Ambulatory Visit: Payer: Self-pay

## 2019-05-28 ENCOUNTER — Ambulatory Visit (INDEPENDENT_AMBULATORY_CARE_PROVIDER_SITE_OTHER): Payer: Medicare Other | Admitting: *Deleted

## 2019-05-28 DIAGNOSIS — T63441D Toxic effect of venom of bees, accidental (unintentional), subsequent encounter: Secondary | ICD-10-CM

## 2019-05-28 DIAGNOSIS — IMO0001 Reserved for inherently not codable concepts without codable children: Secondary | ICD-10-CM

## 2019-05-30 DIAGNOSIS — M25562 Pain in left knee: Secondary | ICD-10-CM | POA: Diagnosis not present

## 2019-05-30 DIAGNOSIS — M25561 Pain in right knee: Secondary | ICD-10-CM | POA: Diagnosis not present

## 2019-05-30 DIAGNOSIS — M25571 Pain in right ankle and joints of right foot: Secondary | ICD-10-CM | POA: Diagnosis not present

## 2019-05-30 DIAGNOSIS — M25572 Pain in left ankle and joints of left foot: Secondary | ICD-10-CM | POA: Diagnosis not present

## 2019-05-30 DIAGNOSIS — M1990 Unspecified osteoarthritis, unspecified site: Secondary | ICD-10-CM | POA: Diagnosis not present

## 2019-05-30 DIAGNOSIS — M25511 Pain in right shoulder: Secondary | ICD-10-CM | POA: Diagnosis not present

## 2019-07-02 ENCOUNTER — Telehealth: Payer: Self-pay | Admitting: Cardiology

## 2019-07-02 NOTE — Telephone Encounter (Signed)
Called patient informed her she is overdue for her follow up appointment with Dr. Agustin Cree and it has been over a year since she was last seen. Was able to schedule her for a appointment next week she has enough medication to ger her until then.

## 2019-07-02 NOTE — Telephone Encounter (Signed)
*  STAT* If patient is at the pharmacy, call can be transferred to refill team.   1. Which medications need to be refilled? (please list name of each medication and dose if known) Furosemide, and Carvedilol,  2. Which pharmacy/location (including street and city if local pharmacy) is medication to be sent to? [facility]- [phone]  3. Do they need a 30 day or 90 day supply? 90 days and refills

## 2019-07-02 NOTE — Telephone Encounter (Signed)
I am routing this to Dr. Wendy Poet nurse to see if it is ok for [place] to refill this.

## 2019-07-12 ENCOUNTER — Encounter: Payer: Self-pay | Admitting: Cardiology

## 2019-07-12 ENCOUNTER — Ambulatory Visit (INDEPENDENT_AMBULATORY_CARE_PROVIDER_SITE_OTHER): Payer: Medicare Other | Admitting: Cardiology

## 2019-07-12 ENCOUNTER — Other Ambulatory Visit: Payer: Self-pay | Admitting: Cardiology

## 2019-07-12 ENCOUNTER — Other Ambulatory Visit: Payer: Self-pay

## 2019-07-12 VITALS — BP 126/88 | HR 108 | Temp 97.7°F | Ht 60.0 in | Wt 219.0 lb

## 2019-07-12 DIAGNOSIS — R0609 Other forms of dyspnea: Secondary | ICD-10-CM

## 2019-07-12 DIAGNOSIS — R002 Palpitations: Secondary | ICD-10-CM | POA: Diagnosis not present

## 2019-07-12 DIAGNOSIS — I428 Other cardiomyopathies: Secondary | ICD-10-CM | POA: Diagnosis not present

## 2019-07-12 DIAGNOSIS — R0600 Dyspnea, unspecified: Secondary | ICD-10-CM | POA: Diagnosis not present

## 2019-07-12 DIAGNOSIS — I5020 Unspecified systolic (congestive) heart failure: Secondary | ICD-10-CM

## 2019-07-12 MED ORDER — POTASSIUM CHLORIDE ER 10 MEQ PO TBCR: 30.0000 meq | EXTENDED_RELEASE_TABLET | Freq: Every day | ORAL | 2 refills | Status: DC

## 2019-07-12 MED ORDER — FUROSEMIDE 40 MG PO TABS
60.0000 mg | ORAL_TABLET | Freq: Every day | ORAL | 2 refills | Status: DC
Start: 2019-07-12 — End: 2019-08-24

## 2019-07-12 NOTE — Progress Notes (Signed)
Cardiology Office Note:    Date:  07/12/2019   ID:  Alexis Stephens, DOB 12-Jan-1936, MRN [id number]  PCP:  Alexis Sheriff, MD  Cardiologist:  Alexis Campus, MD    Referring MD: Alexis Sheriff, MD   Chief Complaint  Patient presents with  . Follow-up    3 MO FU   I had shortness of breath last night  History of Present Illness:    Alexis Stephens is a 84 y.o. female with past medical history significant for known cardiomyopathy initially diagnosed about a year ago she was put on beta-blocker as well as ARB however never follow-up after that.  She requested to be seen recently because of increased weight as well as shortness of breath.  Last night she got classical/typical PND.  She woke up in the middle of the night with shortness of breath.  She tried to use some nebulizers with limited success and she comes today to my office for follow-up she is short of breath not while sitting but any exercise will give her some shortness of breath.  Denies have any chest pain tightness squeezing pressure burning chest.  Swelling of lower extremities is minimal there but worse in the meantime.  Past Medical History:  Diagnosis Date  . Cancer [facility])    Stage 4 Non-Hodgkin Lymphoma, clear for the past year  . Cataract   . GERD (gastroesophageal reflux disease)   . Hymenoptera allergy   . Swelling     Past Surgical History:  Procedure Laterality Date  . ABDOMINAL HYSTERECTOMY    . ADENOIDECTOMY    . APPENDECTOMY    . CHOLECYSTECTOMY    . EYE SURGERY    . NEPHROLITHOTOMY Left   . TONSILLECTOMY      Current Medications: Current Meds  Medication Sig  . Acetaminophen (TYLENOL PO) Take by mouth as needed.  . carvedilol (COREG) 6.25 MG tablet Take 1 tablet (6.25 mg total) by mouth 2 (two) times daily.  . cyclobenzaprine (FLEXERIL) 5 MG tablet Take 1 tablet by mouth as needed.  Marland Kitchen EPINEPHrine (EPIPEN 2-PAK) 0.3 mg/0.3 mL IJ SOAJ injection Inject 0.3 mg into  the muscle once.  . Multiple Vitamin (MULTIVITAMIN) capsule Take by mouth.  . potassium chloride (K-DUR) 10 MEQ tablet Take 1 tablet (10 mEq total) by mouth daily.  . pravastatin (PRAVACHOL) 20 MG tablet Take 20 mg by mouth daily.  . [DISCONTINUED] furosemide (LASIX) 20 MG tablet TAKE 1 TABLET(20 MG) BY MOUTH DAILY     Allergies:   Ivp dye [iodinated diagnostic agents], Mixed vespid venom, Wasp venom, Bee venom, Furadantin [nitrofurantoin], Ibuprofen, Iodine, Iodine-131, Magnesium citrate, Penicillamine, Penicillins, Sulfa antibiotics, and Sulfasalazine   Social History   Socioeconomic History  . Marital status: Widowed    Spouse name: Not on file  . Number of children: Not on file  . Years of education: Not on file  . Highest education level: Not on file  Occupational History  . Not on file  Tobacco Use  . Smoking status: Former Smoker    Quit date: 08/24/1993    Years since quitting: 25.8  . Smokeless tobacco: Never Used  Substance and Sexual Activity  . Alcohol use: Yes    Comment: occasional  . Drug use: Never  . Sexual activity: Not on file  Other Topics Concern  . Not on file  Social History Narrative  . Not on file   Social Determinants of Health   Financial Resource Strain:   .  Difficulty of Paying Living Expenses:   Food Insecurity:   . Worried About Charity fundraiser in the Last Year:   . Arboriculturist in the Last Year:   Transportation Needs:   . Film/video editor (Medical):   Marland Kitchen Lack of Transportation (Non-Medical):   Physical Activity:   . Days of Exercise per Week:   . Minutes of Exercise per Session:   Stress:   . Feeling of Stress :   Social Connections:   . Frequency of Communication with Friends and Family:   . Frequency of Social Gatherings with Friends and Family:   . Attends Religious Services:   . Active Member of Clubs or Organizations:   . Attends Archivist Meetings:   Marland Kitchen Marital Status:      Family History: The  patient's family history includes Cancer in her brother; Osteoarthritis in her mother; Other in her father; Rheum arthritis in her mother. ROS:   Please see the history of present illness.    All 14 point review of systems negative except as described per history of present illness  EKGs/Labs/Other Studies Reviewed:    EKG today showed sinus tachycardia rate 102, voltage criteria for LVH, nonspecific ST segment changes.  No acute changes  Recent Labs: No results found for requested labs within last 8760 hours.  Recent Lipid Panel No results found for: CHOL, TRIG, HDL, CHOLHDL, VLDL, LDLCALC, LDLDIRECT  Physical Exam:    VS:  BP 126/88   Pulse (!) 108   Temp 97.7 F (36.5 C)   Ht 5' (1.524 m)   Wt 219 lb (99.3 kg)   SpO2 92%   BMI 42.77 kg/m     Wt Readings from Last 3 Encounters:  07/12/19 219 lb (99.3 kg)  06/09/18 154 lb (69.9 kg)  04/10/18 158 lb 6.4 oz (71.8 kg)     GEN:  Well nourished, well developed in no acute distress HEENT: Normal NECK: JVD elevated; No carotid bruits LYMPHATICS: No lymphadenopathy CARDIAC: RRR, no murmurs, no rubs, no gallops RESPIRATORY: Poor air entry bilateral few crackles both bases ABDOMEN: Soft, non-tender, non-distended MUSCULOSKELETAL:  No edema; No deformity  SKIN: Warm and dry LOWER EXTREMITIES: no swelling NEUROLOGIC:  Alert and oriented x 3 PSYCHIATRIC:  Normal affect   ASSESSMENT:    1. Nonischemic cardiomyopathy ([place])   2. Dyspnea on exertion   3. HFrEF (heart failure with reduced ejection fraction) ([facility])   4. Palpitations    PLAN:    In order of problems listed above:  1. Nonischemic cardiomyopathy: I did a quick look with echocardiogram which revealed ejection fraction of 20 to 25%.  Obviously we facing situation that her cardiomyopathy get worse.  I offered her hospitalization she declined.  Will increase dose of furosemide she takes 20 mg daily will go to 60 mg daily plus with triple dose of potassium.  I will check  proBNP as well as Chem-7 today.  I see her in my office next week.  The key also will be to augment her therapy for cardiomyopathy.  We will put her on Entresto as well as Aldactone.  But first we need to stabilize her hemodynamic status. 2. Dyspnea on exertion: Cardiomyopathy plan as outlined above. 3. Palpitations denies having any.   Medication Adjustments/Labs and Tests Ordered: Current medicines are reviewed at length with the patient today.  Concerns regarding medicines are outlined above.  No orders of the defined types were placed in this encounter.  Medication  changes: No orders of the defined types were placed in this encounter.   Signed, Park Liter, MD, [facility] 07/12/2019 12:03 PM    [place]

## 2019-07-12 NOTE — Patient Instructions (Signed)
Medication Instructions:  Your physician has recommended you make the following change in your medication:   INCREASE: lasix to 60 mg daily   INCREASE: Potassium to 30 meq daily   *If you need a refill on your cardiac medications before your next appointment, please call your pharmacy*   Lab Work: Your physician recommends that you return for lab work today: bmp, pro bnp, cbc   If you have labs (blood work) drawn today and your tests are completely normal, you will receive your results only by: Marland Kitchen MyChart Message (if you have MyChart) OR . A paper copy in the mail If you have any lab test that is abnormal or we need to change your treatment, we will call you to review the results.   Testing/Procedures: None.    Follow-Up: At [facility], you and your health needs are our priority.  As part of our continuing mission to provide you with exceptional heart care, we have created designated Provider Care Teams.  These Care Teams include your primary Cardiologist (physician) and Advanced Practice Providers (APPs -  Physician Assistants and Nurse Practitioners) who all work together to provide you with the care you need, when you need it.  We recommend signing up for the patient portal called "MyChart".  Sign up information is provided on this After Visit Summary.  MyChart is used to connect with patients for Virtual Visits (Telemedicine).  Patients are able to view lab/test results, encounter notes, upcoming appointments, etc.  Non-urgent messages can be sent to your provider as well.   To learn more about what you can do with MyChart, go to [email or web address].    Your next appointment:   1 week(s)  The format for your next appointment:   In Person  Provider:   [name], MD   Other Instructions

## 2019-07-13 ENCOUNTER — Telehealth: Payer: Self-pay | Admitting: Emergency Medicine

## 2019-07-13 LAB — CBC
Hematocrit: 41.6 % (ref 34.0–46.6)
Hemoglobin: 13.7 g/dL (ref 11.1–15.9)
MCH: 33.4 pg — ABNORMAL HIGH (ref 26.6–33.0)
MCHC: 32.9 g/dL (ref 31.5–35.7)
MCV: 102 fL — ABNORMAL HIGH (ref 79–97)
Platelets: 235 10*3/uL (ref 150–450)
RBC: 4.1 x10E6/uL (ref 3.77–5.28)
RDW: 12 % (ref 11.7–15.4)
WBC: 5.7 10*3/uL (ref 3.4–10.8)

## 2019-07-13 LAB — BASIC METABOLIC PANEL
BUN/Creatinine Ratio: 14 (ref 12–28)
BUN: 17 mg/dL (ref 8–27)
CO2: 24 mmol/L (ref 20–29)
Calcium: 9.3 mg/dL (ref 8.7–10.3)
Chloride: 103 mmol/L (ref 96–106)
Creatinine, Ser: 1.18 mg/dL — ABNORMAL HIGH (ref 0.57–1.00)
GFR calc Af Amer: 50 mL/min/{1.73_m2} — ABNORMAL LOW (ref >59)
GFR calc non Af Amer: 43 mL/min/{1.73_m2} — ABNORMAL LOW (ref >59)
Glucose: 84 mg/dL (ref 65–99)
Potassium: 4.6 mmol/L (ref 3.5–5.2)
Sodium: 143 mmol/L (ref 134–144)

## 2019-07-13 LAB — PRO B NATRIURETIC PEPTIDE: NT-Pro BNP: 13820 pg/mL — ABNORMAL HIGH (ref 0–738)

## 2019-07-13 NOTE — Telephone Encounter (Signed)
-----   Message from Park Liter, MD sent at 07/13/2019  1:07 PM EDT ----- Evidence of significant congestive heart failure.  Her proBNP year ago was 3009 is 13,000.  Kidney function mildly elevated.  Please call her and ask her how she is doing today.  Yesterday with triple the dose of diuretic as well as potassium

## 2019-07-13 NOTE — Telephone Encounter (Signed)
Called patient and informed her of lab results . She reports she was actually feeling better with the increase in medications. Shortness of breath and swelling is better. Will inform Dr. Agustin Cree.

## 2019-07-19 ENCOUNTER — Other Ambulatory Visit: Payer: Self-pay

## 2019-07-19 ENCOUNTER — Ambulatory Visit (INDEPENDENT_AMBULATORY_CARE_PROVIDER_SITE_OTHER): Payer: Medicare Other | Admitting: Cardiology

## 2019-07-19 VITALS — BP 116/72 | HR 89 | Ht 60.0 in | Wt 164.0 lb

## 2019-07-19 DIAGNOSIS — I428 Other cardiomyopathies: Secondary | ICD-10-CM

## 2019-07-19 DIAGNOSIS — R0600 Dyspnea, unspecified: Secondary | ICD-10-CM | POA: Diagnosis not present

## 2019-07-19 DIAGNOSIS — R0609 Other forms of dyspnea: Secondary | ICD-10-CM

## 2019-07-19 DIAGNOSIS — I5020 Unspecified systolic (congestive) heart failure: Secondary | ICD-10-CM | POA: Diagnosis not present

## 2019-07-19 NOTE — Patient Instructions (Addendum)
Medication Instructions:  Your physician recommends that you continue on your current medications as directed. Please refer to the Current Medication list given to you today.  *If you need a refill on your cardiac medications before your next appointment, please call your pharmacy*   Lab Work: Your physician recommends that you return for lab work today: bmp   If you have labs (blood work) drawn today and your tests are completely normal, you will receive your results only by: . MyChart Message (if you have MyChart) OR . A paper copy in the mail If you have any lab test that is abnormal or we need to change your treatment, we will call you to review the results.   Testing/Procedures: None   Follow-Up: At [facility], you and your health needs are our priority.  As part of our continuing mission to provide you with exceptional heart care, we have created designated Provider Care Teams.  These Care Teams include your primary Cardiologist (physician) and Advanced Practice Providers (APPs -  Physician Assistants and Nurse Practitioners) who all work together to provide you with the care you need, when you need it.  We recommend signing up for the patient portal called "MyChart".  Sign up information is provided on this After Visit Summary.  MyChart is used to connect with patients for Virtual Visits (Telemedicine).  Patients are able to view lab/test results, encounter notes, upcoming appointments, etc.  Non-urgent messages can be sent to your provider as well.   To learn more about what you can do with MyChart, go to [email or web address].    Your next appointment:   2 week(s)  The format for your next appointment:   In Person  Provider:   Robert Krasowski, MD   Other Instructions     

## 2019-07-19 NOTE — Progress Notes (Signed)
Cardiology Office Note:    Date:  07/19/2019   ID:  Alexis Stephens, DOB 1935-07-25, MRN [id number]  PCP:  [name], MD  Cardiologist:  [name], MD    Referring MD: [name], MD   Chief Complaint  Patient presents with  . Follow-up    1 WK FU   Doing much better  History of Present Illness:    Alexis Stephens is a 84 y.o. female she is a Programmer, systems from [employer], she is following up with me because of cardiomyopathy which is nonischemic.  I did see her a week ago she was in decompensated congestive heart failure with talk about potentially hospitalization she did not want to do it I did take a quick look with echocardiogram and her ejection fraction which showed 25% ejection fraction.  I gave her furosemide as well as potassium today she comes to my office feeling much better, swelling is gone she does not have any significant proximal nocturnal dyspnea and feeling good.  We continue discussion about what to do with the situation.  In terms of etiology of her cardiomyopathy she tells me when she was treated for her cancer she was taking Adriamycin as well as 5-fluorouracil.  I explained to her that Adriamycin is cardiotoxic and 5-FU can cause vasospasm which can create myocardial infarction s.  Overall however she is doing much better  Past Medical History:  Diagnosis Date  . Cancer [facility])    Stage 4 Non-Hodgkin Lymphoma, clear for the past year  . Cataract   . GERD (gastroesophageal reflux disease)   . Hymenoptera allergy   . Swelling     Past Surgical History:  Procedure Laterality Date  . ABDOMINAL HYSTERECTOMY    . ADENOIDECTOMY    . APPENDECTOMY    . CHOLECYSTECTOMY    . EYE SURGERY    . NEPHROLITHOTOMY Left   . TONSILLECTOMY      Current Medications: Current Meds  Medication Sig  . Acetaminophen (TYLENOL PO) Take by mouth as needed.  . carvedilol (COREG) 6.25 MG tablet TAKE 1 TABLET(6.25 MG) BY MOUTH TWICE  DAILY  . cyclobenzaprine (FLEXERIL) 5 MG tablet Take 1 tablet by mouth as needed.  Marland Kitchen EPINEPHrine (EPIPEN 2-PAK) 0.3 mg/0.3 mL IJ SOAJ injection Inject 0.3 mg into the muscle once.  . furosemide (LASIX) 40 MG tablet Take 1.5 tablets (60 mg total) by mouth daily.  Marland Kitchen losartan (COZAAR) 25 MG tablet TAKE 1 TABLET(25 MG) BY MOUTH DAILY  . Multiple Vitamin (MULTIVITAMIN) capsule Take by mouth.  . potassium chloride (KLOR-CON) 10 MEQ tablet Take 3 tablets (30 mEq total) by mouth daily.  . pravastatin (PRAVACHOL) 20 MG tablet Take 20 mg by mouth daily.     Allergies:   Ivp dye [iodinated diagnostic agents], Mixed vespid venom, Wasp venom, Bee venom, Furadantin [nitrofurantoin], Ibuprofen, Iodine, Iodine-131, Magnesium citrate, Penicillamine, Penicillins, Sulfa antibiotics, and Sulfasalazine   Social History   Socioeconomic History  . Marital status: Widowed    Spouse name: Not on file  . Number of children: Not on file  . Years of education: Not on file  . Highest education level: Not on file  Occupational History  . Not on file  Tobacco Use  . Smoking status: Former Smoker    Quit date: 08/24/1993    Years since quitting: 25.9  . Smokeless tobacco: Never Used  Substance and Sexual Activity  . Alcohol use: Yes    Comment: occasional  .  Drug use: Never  . Sexual activity: Not on file  Other Topics Concern  . Not on file  Social History Narrative  . Not on file   Social Determinants of Health   Financial Resource Strain:   . Difficulty of Paying Living Expenses:   Food Insecurity:   . Worried About Charity fundraiser in the Last Year:   . Arboriculturist in the Last Year:   Transportation Needs:   . Film/video editor (Medical):   Marland Kitchen Lack of Transportation (Non-Medical):   Physical Activity:   . Days of Exercise per Week:   . Minutes of Exercise per Session:   Stress:   . Feeling of Stress :   Social Connections:   . Frequency of Communication with Friends and Family:     . Frequency of Social Gatherings with Friends and Family:   . Attends Religious Services:   . Active Member of Clubs or Organizations:   . Attends Archivist Meetings:   Marland Kitchen Marital Status:      Family History: The patient's family history includes Cancer in her brother; Osteoarthritis in her mother; Other in her father; Rheum arthritis in her mother. ROS:   Please see the history of present illness.    All 14 point review of systems negative except as described per history of present illness  EKGs/Labs/Other Studies Reviewed:      Recent Labs: 07/12/2019: BUN 17; Creatinine, Ser 1.18; Hemoglobin 13.7; NT-Pro BNP 13,820; Platelets 235; Potassium 4.6; Sodium 143  Recent Lipid Panel No results found for: CHOL, TRIG, HDL, CHOLHDL, VLDL, LDLCALC, LDLDIRECT  Physical Exam:    VS:  BP 116/72   Pulse 89   Ht 5' (1.524 m)   Wt 164 lb (74.4 kg)   SpO2 98%   BMI 32.03 kg/m     Wt Readings from Last 3 Encounters:  07/19/19 164 lb (74.4 kg)  07/12/19 219 lb (99.3 kg)  06/09/18 154 lb (69.9 kg)     GEN:  Well nourished, well developed in no acute distress HEENT: Normal NECK: No JVD; No carotid bruits LYMPHATICS: No lymphadenopathy CARDIAC: RRR, no murmurs, no rubs, no gallops RESPIRATORY:  Clear to auscultation without rales, wheezing or rhonchi  ABDOMEN: Soft, non-tender, non-distended MUSCULOSKELETAL:  No edema; No deformity  SKIN: Warm and dry LOWER EXTREMITIES: no swelling NEUROLOGIC:  Alert and oriented x 3 PSYCHIATRIC:  Normal affect   ASSESSMENT:    1. HFrEF (heart failure with reduced ejection fraction) ([place])   2. Nonischemic cardiomyopathy ([place])   3. Dyspnea on exertion    PLAN:    In order of problems listed above:  1. Heart failure with reduced left ventricle ejection fraction: I will check Chem-7 today if Chem-7 is fine we will switch her from losartan to [facility] 24/26.  I will continue beta-blockade I will continue diuretic at present dose.  I  see her back in my office in about 2 weeks to continue titration of her medical therapy.  In the future we may consider evaluation for possible ischemic heart disease however I suspect the cardiomyopathy is related to chemotherapeutic agent that were used before. 2. Dyspnea on exertion: Cardiomyopathy related dramatically improved. 3. Nonischemic cardiomyopathy plan as outlined above.   Medication Adjustments/Labs and Tests Ordered: Current medicines are reviewed at length with the patient today.  Concerns regarding medicines are outlined above.  Orders Placed This Encounter  Procedures  . Basic metabolic panel   Medication changes: No orders  of the defined types were placed in this encounter.   Signed, Park Liter, MD, [facility] 07/19/2019 2:16 PM    [place]

## 2019-07-20 ENCOUNTER — Telehealth: Payer: Self-pay | Admitting: Emergency Medicine

## 2019-07-20 DIAGNOSIS — I5020 Unspecified systolic (congestive) heart failure: Secondary | ICD-10-CM

## 2019-07-20 LAB — BASIC METABOLIC PANEL
BUN/Creatinine Ratio: 19 (ref 12–28)
BUN: 24 mg/dL (ref 8–27)
CO2: 23 mmol/L (ref 20–29)
Calcium: 10 mg/dL (ref 8.7–10.3)
Chloride: 100 mmol/L (ref 96–106)
Creatinine, Ser: 1.28 mg/dL — ABNORMAL HIGH (ref 0.57–1.00)
GFR calc Af Amer: 45 mL/min/{1.73_m2} — ABNORMAL LOW (ref >59)
GFR calc non Af Amer: 39 mL/min/{1.73_m2} — ABNORMAL LOW (ref >59)
Glucose: 81 mg/dL (ref 65–99)
Potassium: 4.2 mmol/L (ref 3.5–5.2)
Sodium: 142 mmol/L (ref 134–144)

## 2019-07-20 MED ORDER — ENTRESTO 24-26 MG PO TABS: 1.0000 | ORAL_TABLET | Freq: Two times a day (BID) | ORAL | 2 refills | Status: DC

## 2019-07-20 NOTE — Telephone Encounter (Signed)
Called patient informed her per Dr. Agustin Cree to stop losartan and start entresto 24/26 mg twice daily and have labs checked in 1 week she verbally understood no further questions.

## 2019-07-23 ENCOUNTER — Ambulatory Visit (INDEPENDENT_AMBULATORY_CARE_PROVIDER_SITE_OTHER): Payer: Medicare Other | Admitting: Allergy and Immunology

## 2019-07-23 ENCOUNTER — Other Ambulatory Visit: Payer: Self-pay

## 2019-07-23 DIAGNOSIS — T6394XD Toxic effect of contact with unspecified venomous animal, undetermined, subsequent encounter: Secondary | ICD-10-CM

## 2019-07-27 DIAGNOSIS — I5020 Unspecified systolic (congestive) heart failure: Secondary | ICD-10-CM | POA: Diagnosis not present

## 2019-07-27 NOTE — Addendum Note (Signed)
Addended by: Ashok Norris on: 07/27/2019 08:41 AM   Modules accepted: Orders

## 2019-07-28 LAB — BASIC METABOLIC PANEL
BUN/Creatinine Ratio: 21 (ref 12–28)
BUN: 28 mg/dL — ABNORMAL HIGH (ref 8–27)
CO2: 21 mmol/L (ref 20–29)
Calcium: 9.3 mg/dL (ref 8.7–10.3)
Chloride: 103 mmol/L (ref 96–106)
Creatinine, Ser: 1.32 mg/dL — ABNORMAL HIGH (ref 0.57–1.00)
GFR calc Af Amer: 43 mL/min/{1.73_m2} — ABNORMAL LOW (ref >59)
GFR calc non Af Amer: 38 mL/min/{1.73_m2} — ABNORMAL LOW (ref >59)
Glucose: 74 mg/dL (ref 65–99)
Potassium: 5 mmol/L (ref 3.5–5.2)
Sodium: 137 mmol/L (ref 134–144)

## 2019-07-31 ENCOUNTER — Encounter: Payer: Self-pay | Admitting: Cardiology

## 2019-07-31 ENCOUNTER — Other Ambulatory Visit: Payer: Self-pay

## 2019-07-31 ENCOUNTER — Ambulatory Visit (INDEPENDENT_AMBULATORY_CARE_PROVIDER_SITE_OTHER): Payer: Medicare Other | Admitting: Cardiology

## 2019-07-31 ENCOUNTER — Telehealth: Payer: Self-pay | Admitting: Cardiology

## 2019-07-31 VITALS — BP 126/72 | HR 109 | Ht 60.0 in | Wt 164.0 lb

## 2019-07-31 DIAGNOSIS — R0600 Dyspnea, unspecified: Secondary | ICD-10-CM

## 2019-07-31 DIAGNOSIS — I428 Other cardiomyopathies: Secondary | ICD-10-CM | POA: Diagnosis not present

## 2019-07-31 DIAGNOSIS — R002 Palpitations: Secondary | ICD-10-CM

## 2019-07-31 DIAGNOSIS — I5020 Unspecified systolic (congestive) heart failure: Secondary | ICD-10-CM | POA: Diagnosis not present

## 2019-07-31 DIAGNOSIS — R0609 Other forms of dyspnea: Secondary | ICD-10-CM

## 2019-07-31 NOTE — Progress Notes (Signed)
Cardiology Office Note:    Date:  07/31/2019   ID:  Alexis Stephens Alexis Stephens, DOB 1935-05-26, MRN [id number]  PCP:  No primary care provider on file.  Cardiologist:  [name], MD    Referring MD: [name], MD   Chief Complaint  Patient presents with  . Follow-up    2 WK FU   Doing much better  History of Present Illness:    Alexis Stephens is a 84 y.o. female with history of lymphoma she used to be on Adriamycin, after that she got history of cardiomyopathy and recently experienced decompensated congestive heart failure with significantly diminished left ventricle ejection fraction being 25% focus of treatment was furosemide with potassium which make her feel much better.  After that we gradually trying to put her on appropriate medication.  She is on Entresto 2426, tolerating well.  Her creatinine increased slightly with latest creatinine of 1.28, she is also on carvedilol 6.25 twice daily.  We talked in length about what to do with the situation where she is doing well but she described to have some pain on the right side of her chest which is not related to exercise.  Of course is a possibility she have ischemic heart disease but I wonder to her medication first before investigated that issue.  Therefore, today we will do EKG if EKG is acceptable we will increase dose of carvedilol.  I see her back in my office in about 3 weeks to titrate medication upward.  Past Medical History:  Diagnosis Date  . Cancer [facility])    Stage 4 Non-Hodgkin Lymphoma, clear for the past year  . Cataract   . GERD (gastroesophageal reflux disease)   . Hymenoptera allergy   . Swelling     Past Surgical History:  Procedure Laterality Date  . ABDOMINAL HYSTERECTOMY    . ADENOIDECTOMY    . APPENDECTOMY    . CHOLECYSTECTOMY    . EYE SURGERY    . NEPHROLITHOTOMY Left   . TONSILLECTOMY      Current Medications: Current Meds  Medication Sig  . Acetaminophen (TYLENOL PO)  Take by mouth as needed.  . carvedilol (COREG) 6.25 MG tablet TAKE 1 TABLET(6.25 MG) BY MOUTH TWICE DAILY  . cyclobenzaprine (FLEXERIL) 5 MG tablet Take 1 tablet by mouth as needed.  Marland Kitchen EPINEPHrine (EPIPEN 2-PAK) 0.3 mg/0.3 mL IJ SOAJ injection Inject 0.3 mg into the muscle once.  . furosemide (LASIX) 40 MG tablet Take 1.5 tablets (60 mg total) by mouth daily.  . Multiple Vitamin (MULTIVITAMIN) capsule Take by mouth.  . potassium chloride (KLOR-CON) 10 MEQ tablet Take 3 tablets (30 mEq total) by mouth daily.  . pravastatin (PRAVACHOL) 20 MG tablet Take 20 mg by mouth daily.  . sacubitril-valsartan (ENTRESTO) 24-26 MG Take 1 tablet by mouth 2 (two) times daily. ENTRESTO FREE MONTH CARD RXBIN [id number] RXGRP [id number] ISSUER [id number]     Allergies:   Ivp dye [iodinated diagnostic agents], Mixed vespid venom, Wasp venom, Bee venom, Furadantin [nitrofurantoin], Ibuprofen, Iodine, Iodine-131, Magnesium citrate, Penicillamine, Penicillins, Sulfa antibiotics, and Sulfasalazine   Social History   Socioeconomic History  . Marital status: Widowed    Spouse name: Not on file  . Number of children: Not on file  . Years of education: Not on file  . Highest education level: Not on file  Occupational History  . Not on file  Tobacco Use  . Smoking status: Former Audiological scientist  date: 08/24/1993    Years since quitting: 25.9  . Smokeless tobacco: Never Used  Substance and Sexual Activity  . Alcohol use: Yes    Comment: occasional  . Drug use: Never  . Sexual activity: Not on file  Other Topics Concern  . Not on file  Social History Narrative  . Not on file   Social Determinants of Health   Financial Resource Strain:   . Difficulty of Paying Living Expenses:   Food Insecurity:   . Worried About Charity fundraiser in the Last Year:   . Arboriculturist in the Last Year:   Transportation Needs:   . Film/video editor (Medical):   Marland Kitchen Lack of Transportation (Non-Medical):     Physical Activity:   . Days of Exercise per Week:   . Minutes of Exercise per Session:   Stress:   . Feeling of Stress :   Social Connections:   . Frequency of Communication with Friends and Family:   . Frequency of Social Gatherings with Friends and Family:   . Attends Religious Services:   . Active Member of Clubs or Organizations:   . Attends Archivist Meetings:   Marland Kitchen Marital Status:      Family History: The patient's family history includes Cancer in her brother; Osteoarthritis in her mother; Other in her father; Rheum arthritis in her mother. ROS:   Please see the history of present illness.    All 14 point review of systems negative except as described per history of present illness  EKGs/Labs/Other Studies Reviewed:      Recent Labs: 07/12/2019: Hemoglobin 13.7; NT-Pro BNP 13,820; Platelets 235 07/27/2019: BUN 28; Creatinine, Ser 1.32; Potassium 5.0; Sodium 137  Recent Lipid Panel No results found for: CHOL, TRIG, HDL, CHOLHDL, VLDL, LDLCALC, LDLDIRECT  Physical Exam:    VS:  BP 126/72   Pulse (!) 109   Ht 5' (1.524 m)   Wt 164 lb (74.4 kg)   SpO2 95%   BMI 32.03 kg/m     Wt Readings from Last 3 Encounters:  07/31/19 164 lb (74.4 kg)  07/19/19 164 lb (74.4 kg)  07/12/19 219 lb (99.3 kg)     GEN:  Well nourished, well developed in no acute distress HEENT: Normal NECK: No JVD; No carotid bruits LYMPHATICS: No lymphadenopathy CARDIAC: RRR, no murmurs, no rubs, no gallops RESPIRATORY:  Clear to auscultation without rales, wheezing or rhonchi  ABDOMEN: Soft, non-tender, non-distended MUSCULOSKELETAL:  No edema; No deformity  SKIN: Warm and dry LOWER EXTREMITIES: no swelling NEUROLOGIC:  Alert and oriented x 3 PSYCHIATRIC:  Normal affect   ASSESSMENT:    1. HFrEF (heart failure with reduced ejection fraction) ([place])   2. Nonischemic cardiomyopathy ([facility])   3. Palpitations   4. Dyspnea on exertion    PLAN:    In order of problems listed  above:  1. Heart failure with reduced left ventricle ejection fraction plan as outlined above.  Still mildly decompensated. 2. Nonischemic cardiomyopathy gradually try to put on the right medication. 3. Palpitations: Denies having any. 4. Dyslipidemia: Significantly improved with proper management.   Medication Adjustments/Labs and Tests Ordered: Current medicines are reviewed at length with the patient today.  Concerns regarding medicines are outlined above.  No orders of the defined types were placed in this encounter.  Medication changes: No orders of the defined types were placed in this encounter.   Signed, Park Liter, MD, [facility] 07/31/2019 3:18 PM  [facility]

## 2019-07-31 NOTE — Patient Instructions (Signed)
Medication Instructions:  Your physician recommends that you continue on your current medications as directed. Please refer to the Current Medication list given to you today.  *If you need a refill on your cardiac medications before your next appointment, please call your pharmacy*   Lab Work: Your physician recommends that you return for lab work in: TODAY BMP If you have labs (blood work) drawn today and your tests are completely normal, you will receive your results only by: Marland Kitchen MyChart Message (if you have MyChart) OR . A paper copy in the mail If you have any lab test that is abnormal or we need to change your treatment, we will call you to review the results.   Testing/Procedures: None   Follow-Up: At [facility], you and your health needs are our priority.  As part of our continuing mission to provide you with exceptional heart care, we have created designated Provider Care Teams.  These Care Teams include your primary Cardiologist (physician) and Advanced Practice Providers (APPs -  Physician Assistants and Nurse Practitioners) who all work together to provide you with the care you need, when you need it.  We recommend signing up for the patient portal called "MyChart".  Sign up information is provided on this After Visit Summary.  MyChart is used to connect with patients for Virtual Visits (Telemedicine).  Patients are able to view lab/test results, encounter notes, upcoming appointments, etc.  Non-urgent messages can be sent to your provider as well.   To learn more about what you can do with MyChart, go to [email or web address].    Your next appointment:   3 week(s)  The format for your next appointment:   In Person  Provider:   [name], MD   Other Instructions

## 2019-07-31 NOTE — Telephone Encounter (Signed)
What kind of plain date is, is this pressurized plane if so, should be no problem also how long is the flight

## 2019-07-31 NOTE — Telephone Encounter (Signed)
    Pt said when she got home her daughter called and invited her to come in to [place] for a visit. She said her daughter got a private plane to fly from [employer] [employer] to [place]. She wanted to know if its ok for her to ride on a plane   Please advise

## 2019-08-01 LAB — BASIC METABOLIC PANEL
BUN/Creatinine Ratio: 24 (ref 12–28)
BUN: 42 mg/dL — ABNORMAL HIGH (ref 8–27)
CO2: 16 mmol/L — ABNORMAL LOW (ref 20–29)
Calcium: 9.1 mg/dL (ref 8.7–10.3)
Chloride: 103 mmol/L (ref 96–106)
Creatinine, Ser: 1.74 mg/dL — ABNORMAL HIGH (ref 0.57–1.00)
GFR calc Af Amer: 31 mL/min/{1.73_m2} — ABNORMAL LOW (ref >59)
GFR calc non Af Amer: 27 mL/min/{1.73_m2} — ABNORMAL LOW (ref >59)
Glucose: 74 mg/dL (ref 65–99)
Potassium: 4.9 mmol/L (ref 3.5–5.2)
Sodium: 138 mmol/L (ref 134–144)

## 2019-08-01 NOTE — Telephone Encounter (Signed)
Left message for patient to return call.

## 2019-08-01 NOTE — Telephone Encounter (Signed)
Left message on patients voicemail to please return our call.   

## 2019-08-01 NOTE — Telephone Encounter (Signed)
I spoke with the patient just now and she let me know that it is a private 4-seater plane. She also said that he flight would only last about an hour to an hour and a half. She said she does not care either way but she wanted Dr. Wendy Poet recommendation on if he thought it was ok or not.   I will route this back to him for further advise.

## 2019-08-01 NOTE — Telephone Encounter (Signed)
Follow up  ° ° °Pt is returning call  ° ° °Please call back  °

## 2019-08-03 NOTE — Telephone Encounter (Signed)
Probably not the best idea if it is small f plain it is most likely not pressurized with her condition not a good idea to do that

## 2019-08-03 NOTE — Telephone Encounter (Signed)
Spoke to the patient and let her know that Dr. Agustin Cree said that he does not think that it would be a good a good idea for her to fly in this small plane. She verbalizes understanding and thanks me for the call back.   Encouraged patient to call back with any questions or concerns.

## 2019-08-23 ENCOUNTER — Other Ambulatory Visit: Payer: Self-pay

## 2019-08-23 ENCOUNTER — Encounter: Payer: Self-pay | Admitting: Cardiology

## 2019-08-23 ENCOUNTER — Ambulatory Visit (INDEPENDENT_AMBULATORY_CARE_PROVIDER_SITE_OTHER): Payer: Medicare Other | Admitting: Cardiology

## 2019-08-23 VITALS — BP 120/78 | HR 95 | Wt 168.8 lb

## 2019-08-23 DIAGNOSIS — M7989 Other specified soft tissue disorders: Secondary | ICD-10-CM

## 2019-08-23 DIAGNOSIS — I5020 Unspecified systolic (congestive) heart failure: Secondary | ICD-10-CM | POA: Diagnosis not present

## 2019-08-23 DIAGNOSIS — R590 Localized enlarged lymph nodes: Secondary | ICD-10-CM

## 2019-08-23 DIAGNOSIS — R0609 Other forms of dyspnea: Secondary | ICD-10-CM

## 2019-08-23 DIAGNOSIS — I428 Other cardiomyopathies: Secondary | ICD-10-CM

## 2019-08-23 DIAGNOSIS — R0600 Dyspnea, unspecified: Secondary | ICD-10-CM | POA: Diagnosis not present

## 2019-08-23 NOTE — Progress Notes (Signed)
Cardiology Office Note:    Date:  08/23/2019   ID:  Alexis Stephens, DOB 13-Jul-1935, MRN [id number]  PCP:  [name], MD  Cardiologist:  [name], MD    Referring MD: No ref. provider found   Chief Complaint  Patient presents with  . Follow-up  Having swelling of left lower extremity  History of Present Illness:    Alexis Stephens is a 84 y.o. female with past medical history significant for recently recognized what appears to be nonischemic cardiomyopathy with ejection fraction 20 to 25%. On plan to put on right medications with to have some difficulty with creatinine going up as well as blood pressure being low. Today she comes to my office she complained of having some swelling of lower extremities. She did have lymphoma which involve left groin lymph nodes and she is scheduled ready to see her oncologist to evaluate that. Her leg is swollen only mildly, not tender, I do not see any redness. She describes some itching over there. In terms of cardiac issues she complains of gaining few pounds. Also having mild shortness of breath.  Past Medical History:  Diagnosis Date  . Cancer [facility])    Stage 4 Non-Hodgkin Lymphoma, clear for the past year  . Cataract   . GERD (gastroesophageal reflux disease)   . Hymenoptera allergy   . Swelling     Past Surgical History:  Procedure Laterality Date  . ABDOMINAL HYSTERECTOMY    . ADENOIDECTOMY    . APPENDECTOMY    . CHOLECYSTECTOMY    . EYE SURGERY    . NEPHROLITHOTOMY Left   . TONSILLECTOMY      Current Medications: Current Meds  Medication Sig  . Acetaminophen (TYLENOL PO) Take by mouth as needed.  . carvedilol (COREG) 6.25 MG tablet TAKE 1 TABLET(6.25 MG) BY MOUTH TWICE DAILY  . cyclobenzaprine (FLEXERIL) 5 MG tablet Take 1 tablet by mouth as needed.  Marland Kitchen EPINEPHrine (EPIPEN 2-PAK) 0.3 mg/0.3 mL IJ SOAJ injection Inject 0.3 mg into the muscle once.  . furosemide (LASIX) 40 MG tablet Take  1.5 tablets (60 mg total) by mouth daily.  . Multiple Vitamin (MULTIVITAMIN) capsule Take by mouth.  . potassium chloride (KLOR-CON) 10 MEQ tablet Take 3 tablets (30 mEq total) by mouth daily.  . pravastatin (PRAVACHOL) 20 MG tablet Take 20 mg by mouth daily.  . sacubitril-valsartan (ENTRESTO) 24-26 MG Take 1 tablet by mouth 2 (two) times daily. ENTRESTO FREE MONTH CARD RXBIN [id number] RXGRP [id number] ISSUER [id number]     Allergies:   Ivp dye [iodinated diagnostic agents], Mixed vespid venom, Wasp venom, Bee venom, Furadantin [nitrofurantoin], Ibuprofen, Iodine, Iodine-131, Magnesium citrate, Penicillamine, Penicillins, Sulfa antibiotics, and Sulfasalazine   Social History   Socioeconomic History  . Marital status: Widowed    Spouse name: Not on file  . Number of children: Not on file  . Years of education: Not on file  . Highest education level: Not on file  Occupational History  . Not on file  Tobacco Use  . Smoking status: Former Smoker    Quit date: 08/24/1993    Years since quitting: 26.0  . Smokeless tobacco: Never Used  Substance and Sexual Activity  . Alcohol use: Yes    Comment: occasional  . Drug use: Never  . Sexual activity: Not on file  Other Topics Concern  . Not on file  Social History Narrative  . Not on file   Social Determinants of  Health   Financial Resource Strain:   . Difficulty of Paying Living Expenses:   Food Insecurity:   . Worried About Charity fundraiser in the Last Year:   . Arboriculturist in the Last Year:   Transportation Needs:   . Film/video editor (Medical):   Marland Kitchen Lack of Transportation (Non-Medical):   Physical Activity:   . Days of Exercise per Week:   . Minutes of Exercise per Session:   Stress:   . Feeling of Stress :   Social Connections:   . Frequency of Communication with Friends and Family:   . Frequency of Social Gatherings with Friends and Family:   . Attends Religious Services:   . Active Member of  Clubs or Organizations:   . Attends Archivist Meetings:   Marland Kitchen Marital Status:      Family History: The patient's family history includes Cancer in her brother; Osteoarthritis in her mother; Other in her father; Rheum arthritis in her mother. ROS:   Please see the history of present illness.    All 14 point review of systems negative except as described per history of present illness  EKGs/Labs/Other Studies Reviewed:      Recent Labs: 07/12/2019: Hemoglobin 13.7; NT-Pro BNP 13,820; Platelets 235 07/31/2019: BUN 42; Creatinine, Ser 1.74; Potassium 4.9; Sodium 138  Recent Lipid Panel No results found for: CHOL, TRIG, HDL, CHOLHDL, VLDL, LDLCALC, LDLDIRECT  Physical Exam:    VS:  BP 120/78 (BP Location: Left Arm, Patient Position: Sitting)   Pulse 95   Wt 168 lb 12.8 oz (76.6 kg)   SpO2 100%   BMI 32.97 kg/m     Wt Readings from Last 3 Encounters:  08/23/19 168 lb 12.8 oz (76.6 kg)  07/31/19 164 lb (74.4 kg)  07/19/19 164 lb (74.4 kg)     GEN:  Well nourished, well developed in no acute distress HEENT: Normal NECK: No JVD; No carotid bruits LYMPHATICS: No lymphadenopathy CARDIAC: RRR, no murmurs, no rubs, no gallops RESPIRATORY:  Clear to auscultation without rales, wheezing or rhonchi  ABDOMEN: Soft, non-tender, non-distended MUSCULOSKELETAL:  No edema; No deformity  SKIN: Warm and dry LOWER EXTREMITIES: no swelling NEUROLOGIC:  Alert and oriented x 3 PSYCHIATRIC:  Normal affect   ASSESSMENT:    1. Dyspnea on exertion   2. Swelling of lower extremity   3. HFrEF (heart failure with reduced ejection fraction) ([place])   4. Nonischemic cardiomyopathy ([place])   5. Lymphadenopathy, inguinal    PLAN:    In order of problems listed above:  1. Dyspnea on exertion we will check proBNP as well as Chem-7 to see how much more we can go with medications. 2. Swelling of left lower extremities D-dimer will be done if D-dimer is negative then we can drop the issue of DVT  for now. DVT study will be done she is scheduled to see oncologist to evaluate that. 3. Heart failure with reduced left ventricle ejection fraction we will do EKG I will not change any of her medication today until I will get her blood test back. 4. Lymphadenopathy: Scheduled to see her oncologist.   Medication Adjustments/Labs and Tests Ordered: Current medicines are reviewed at length with the patient today.  Concerns regarding medicines are outlined above.  Orders Placed This Encounter  Procedures  . DG Chest 2 View  . D-Dimer, Quantitative  . Basic metabolic panel  . EKG 12-Lead   Medication changes: No orders of the defined types were  placed in this encounter.   Signed, Park Liter, MD, [facility] 08/23/2019 [facility]

## 2019-08-23 NOTE — Patient Instructions (Signed)
Medication Instructions:  Your physician recommends that you continue on your current medications as directed. Please refer to the Current Medication list given to you today.  *If you need a refill on your cardiac medications before your next appointment, please call your pharmacy*   Lab Work: Your physician recommends that you return for lab work today: bmp, ddimer  If you have labs (blood work) drawn today and your tests are completely normal, you will receive your results only by: Marland Kitchen MyChart Message (if you have MyChart) OR . A paper copy in the mail If you have any lab test that is abnormal or we need to change your treatment, we will call you to review the results.   Testing/Procedures: A chest x-ray takes a picture of the organs and structures inside the chest, including the heart, lungs, and blood vessels. This test can show several things, including, whether the heart is enlarges; whether fluid is building up in the lungs; and whether pacemaker / defibrillator leads are still in place.    Follow-Up: At [facility], you and your health needs are our priority.  As part of our continuing mission to provide you with exceptional heart care, we have created designated Provider Care Teams.  These Care Teams include your primary Cardiologist (physician) and Advanced Practice Providers (APPs -  Physician Assistants and Nurse Practitioners) who all work together to provide you with the care you need, when you need it.  We recommend signing up for the patient portal called "MyChart".  Sign up information is provided on this After Visit Summary.  MyChart is used to connect with patients for Virtual Visits (Telemedicine).  Patients are able to view lab/test results, encounter notes, upcoming appointments, etc.  Non-urgent messages can be sent to your provider as well.   To learn more about what you can do with MyChart, go to [email or web address].    Your next appointment:   3 week(s)  The  format for your next appointment:   In Person  Provider:   [name], MD   Other Instructions

## 2019-08-24 ENCOUNTER — Telehealth: Payer: Self-pay | Admitting: Cardiology

## 2019-08-24 ENCOUNTER — Other Ambulatory Visit: Payer: Self-pay | Admitting: Cardiology

## 2019-08-24 ENCOUNTER — Encounter: Payer: Self-pay | Admitting: Cardiology

## 2019-08-24 DIAGNOSIS — I5020 Unspecified systolic (congestive) heart failure: Secondary | ICD-10-CM

## 2019-08-24 DIAGNOSIS — R7989 Other specified abnormal findings of blood chemistry: Secondary | ICD-10-CM

## 2019-08-24 LAB — PRO B NATRIURETIC PEPTIDE: NT-Pro BNP: 5922 pg/mL — ABNORMAL HIGH (ref 0–738)

## 2019-08-24 LAB — BASIC METABOLIC PANEL
BUN/Creatinine Ratio: 21 (ref 12–28)
BUN: 23 mg/dL (ref 8–27)
CO2: 20 mmol/L (ref 20–29)
Calcium: 9.1 mg/dL (ref 8.7–10.3)
Chloride: 107 mmol/L — ABNORMAL HIGH (ref 96–106)
Creatinine, Ser: 1.09 mg/dL — ABNORMAL HIGH (ref 0.57–1.00)
GFR calc Af Amer: 55 mL/min/{1.73_m2} — ABNORMAL LOW (ref >59)
GFR calc non Af Amer: 47 mL/min/{1.73_m2} — ABNORMAL LOW (ref >59)
Glucose: 73 mg/dL (ref 65–99)
Potassium: 3.9 mmol/L (ref 3.5–5.2)
Sodium: 141 mmol/L (ref 134–144)

## 2019-08-24 LAB — D-DIMER, QUANTITATIVE: D-DIMER: 1.24 mg/L FEU — ABNORMAL HIGH (ref 0.00–0.49)

## 2019-08-24 MED ORDER — FUROSEMIDE 40 MG PO TABS: 80.0000 mg | ORAL_TABLET | Freq: Every day | ORAL | 1 refills | Status: DC

## 2019-08-24 MED ORDER — POTASSIUM CHLORIDE ER 20 MEQ PO TBCR: 40.0000 meq | EXTENDED_RELEASE_TABLET | Freq: Every day | ORAL | 1 refills | Status: DC

## 2019-08-24 NOTE — Telephone Encounter (Signed)
Patient returning Alexis Stephens call.  

## 2019-08-24 NOTE — Telephone Encounter (Signed)
Called patient back informed her of lab results and that Dr. Agustin Cree advised she increase lasix to 80 mg daily and increase potassium to 40 meq daily. Patient also advised she will need a venous ultrasound of her legs that will most likely be scheduled for next week she is aware that we will call with this appointment date and time. No further questions. She will also have labs rechecked in 1 week.

## 2019-08-27 ENCOUNTER — Encounter: Payer: Self-pay | Admitting: Cardiology

## 2019-08-27 ENCOUNTER — Telehealth: Payer: Self-pay | Admitting: *Deleted

## 2019-08-27 NOTE — Telephone Encounter (Signed)
Left message for pt letting them know that Appt at [facility] for Lower Extremity is scheduled for tonight 6/28, ck in at 6:30 for appt at 7:00.

## 2019-08-27 NOTE — Telephone Encounter (Signed)
Informed pt to be at [facility] at 6:30 for 7:00 appt for [place] of lower extremities. Pt verbalized understanding.

## 2019-08-27 NOTE — Telephone Encounter (Signed)
error 

## 2019-08-27 NOTE — Telephone Encounter (Signed)
Patient returned call regarding appt for tonight at [place] given to patient on appt.

## 2019-08-29 ENCOUNTER — Telehealth: Payer: Self-pay | Admitting: Cardiology

## 2019-08-29 ENCOUNTER — Telehealth: Payer: Self-pay | Admitting: Emergency Medicine

## 2019-08-29 NOTE — Telephone Encounter (Signed)
New message   Patient states that her sacubitril-valsartan (ENTRESTO) 24-26 MG is $600.00 Patient states that she can't afford medication. Please call to discuss options.

## 2019-08-29 NOTE — Telephone Encounter (Signed)
Called patient informed her of the patient assistance program we have, as well as the grant program we have and that I have samples in the office to hold her over until approval however she doesn't want to go through the headache of the application and wants to be switched to a different medication even though there isn't one as effective. Will consult with Dr. Agustin Cree and see what he would like to switch her to and will call the patient back tomorrow after 2pm as she has requested.

## 2019-08-29 NOTE — Telephone Encounter (Signed)
Called patient and informed of results of lower extremity ultrasound. No further questions.

## 2019-08-30 NOTE — Telephone Encounter (Signed)
I would insist on getting assistance with Entresto.  Delene Loll is very unique medication which clearly helps.  I will not change to different 1 if her radiator is not her options then we can use losartan 50 mg twice daily.  But I really prefer Delene Loll

## 2019-08-30 NOTE — Telephone Encounter (Signed)
Attempted to call patient phone no answer and no voicemail.

## 2019-08-31 ENCOUNTER — Telehealth: Payer: Self-pay | Admitting: Cardiology

## 2019-08-31 LAB — BASIC METABOLIC PANEL
BUN/Creatinine Ratio: 17 (ref 12–28)
BUN: 18 mg/dL (ref 8–27)
CO2: 21 mmol/L (ref 20–29)
Calcium: 9.1 mg/dL (ref 8.7–10.3)
Chloride: 105 mmol/L (ref 96–106)
Creatinine, Ser: 1.04 mg/dL — ABNORMAL HIGH (ref 0.57–1.00)
GFR calc Af Amer: 58 mL/min/{1.73_m2} — ABNORMAL LOW (ref >59)
GFR calc non Af Amer: 50 mL/min/{1.73_m2} — ABNORMAL LOW (ref >59)
Glucose: 76 mg/dL (ref 65–99)
Potassium: 4.2 mmol/L (ref 3.5–5.2)
Sodium: 139 mmol/L (ref 134–144)

## 2019-08-31 MED ORDER — LOSARTAN POTASSIUM 25 MG PO TABS
25.0000 mg | ORAL_TABLET | Freq: Two times a day (BID) | ORAL | 1 refills | Status: DC
Start: 2019-08-31 — End: 2019-09-14

## 2019-08-31 NOTE — Telephone Encounter (Signed)
Alexis Stephens is returning [name]'s call. She states she is coming in for blood work today and if it would be easier it can just be discussed then. Please advise.

## 2019-08-31 NOTE — Telephone Encounter (Signed)
Please see additional encounter.

## 2019-08-31 NOTE — Telephone Encounter (Signed)
Called patient confirmed she was talking about losartan. She will stop entresto and start losartan 25 mg twice daily even though we advised highly to stay on entresto she just doesn't want to deal with the price and paperwork of patient assistance. No further questions.

## 2019-09-14 ENCOUNTER — Other Ambulatory Visit: Payer: Self-pay

## 2019-09-14 ENCOUNTER — Encounter: Payer: Self-pay | Admitting: Cardiology

## 2019-09-14 ENCOUNTER — Ambulatory Visit (INDEPENDENT_AMBULATORY_CARE_PROVIDER_SITE_OTHER): Payer: Medicare Other | Admitting: Cardiology

## 2019-09-14 VITALS — BP 112/58 | HR 57 | Ht 60.0 in | Wt 164.8 lb

## 2019-09-14 DIAGNOSIS — R0600 Dyspnea, unspecified: Secondary | ICD-10-CM

## 2019-09-14 DIAGNOSIS — R0609 Other forms of dyspnea: Secondary | ICD-10-CM

## 2019-09-14 DIAGNOSIS — I428 Other cardiomyopathies: Secondary | ICD-10-CM | POA: Diagnosis not present

## 2019-09-14 DIAGNOSIS — I5020 Unspecified systolic (congestive) heart failure: Secondary | ICD-10-CM

## 2019-09-14 MED ORDER — LOSARTAN POTASSIUM 50 MG PO TABS: 50.0000 mg | ORAL_TABLET | Freq: Two times a day (BID) | ORAL | 1 refills | Status: DC

## 2019-09-14 NOTE — Addendum Note (Signed)
Addended by: Ashok Norris on: 09/14/2019 03:09 PM   Modules accepted: Orders

## 2019-09-14 NOTE — Patient Instructions (Signed)
Medication Instructions:  Your physician has recommended you make the following change in your medication:  INCREASE: Losartan 50 mg twice daily   *If you need a refill on your cardiac medications before your next appointment, please call your pharmacy*   Lab Work: Your physician recommends that you return for lab work in 1 week: bmp   If you have labs (blood work) drawn today and your tests are completely normal, you will receive your results only by: Marland Kitchen MyChart Message (if you have MyChart) OR . A paper copy in the mail If you have any lab test that is abnormal or we need to change your treatment, we will call you to review the results.   Testing/Procedures: None.    Follow-Up: At [facility], you and your health needs are our priority.  As part of our continuing mission to provide you with exceptional heart care, we have created designated Provider Care Teams.  These Care Teams include your primary Cardiologist (physician) and Advanced Practice Providers (APPs -  Physician Assistants and Nurse Practitioners) who all work together to provide you with the care you need, when you need it.  We recommend signing up for the patient portal called "MyChart".  Sign up information is provided on this After Visit Summary.  MyChart is used to connect with patients for Virtual Visits (Telemedicine).  Patients are able to view lab/test results, encounter notes, upcoming appointments, etc.  Non-urgent messages can be sent to your provider as well.   To learn more about what you can do with MyChart, go to [email or web address].    Your next appointment:   6 week(s)  The format for your next appointment:   In Person  Provider:   [name], MD   Other Instructions

## 2019-09-14 NOTE — Progress Notes (Signed)
Cardiology Office Note:    Date:  09/14/2019   ID:  Alexis Stephens, DOB 1935-06-23, MRN [id number]  PCP:  [name], MD  Cardiologist:  [name], MD    Referring MD: [name], MD   No chief complaint on file. Doing much better  History of Present Illness:    Alexis Stephens is a 84 y.o. female with past medical history significant for cardiomyopathy which is nonischemic with ejection fraction 20 to 25%.  Gradually trying to tolerate medications.  She was tolerating Entresto but she cannot afford it, therefore have to switch her to losartan.  She is also taking beta-blocker small dose because that is the only dose she can tolerate.  Today what we will try to do is to increase dose of losartan from 25-50 twice daily, Chem-7 will be done in the next few days.  We did talk in length about the situation.  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  She is very happy to be done.  Denies have any chest pain tightness squeezing pressure burning chest no swelling of lower extremities.  Previously she did have some swelling of left lower extremities were documenting DVT study but she did not want to do that.  Past Medical History:  Diagnosis Date  . Cancer [facility])    Stage 4 Non-Hodgkin Lymphoma, clear for the past year  . Cataract   . GERD (gastroesophageal reflux disease)   . Hymenoptera allergy   . Swelling     Past Surgical History:  Procedure Laterality Date  . ABDOMINAL HYSTERECTOMY    . ADENOIDECTOMY    . APPENDECTOMY    . CHOLECYSTECTOMY    . EYE SURGERY    . NEPHROLITHOTOMY Left   . TONSILLECTOMY      Current Medications: Current Meds  Medication Sig  . Acetaminophen (TYLENOL PO) Take by mouth as needed.  . carvedilol (COREG) 6.25 MG tablet TAKE 1 TABLET(6.25 MG) BY MOUTH TWICE DAILY  .  cyclobenzaprine (FLEXERIL) 5 MG tablet Take 1 tablet by mouth as needed.  Marland Kitchen EPINEPHrine (EPIPEN 2-PAK) 0.3 mg/0.3 mL IJ SOAJ injection Inject 0.3 mg into the muscle once.  . furosemide (LASIX) 40 MG tablet TAKE 2 TABLETS(80 MG) BY MOUTH DAILY  . losartan (COZAAR) 25 MG tablet Take 1 tablet (25 mg total) by mouth in the morning and at bedtime.  . Multiple Vitamin (MULTIVITAMIN) capsule Take by mouth.  . Potassium Chloride ER 20 MEQ TBCR TAKE 2 TABLETS BY MOUTH DAILY  . pravastatin (PRAVACHOL) 20 MG tablet Take 20 mg by mouth daily.     Allergies:   Ivp dye [iodinated diagnostic agents], Mixed vespid venom, Wasp venom, Bee venom, Furadantin [nitrofurantoin], Ibuprofen, Iodine, Iodine-131, Magnesium citrate, Penicillamine, Penicillins, Sulfa antibiotics, and Sulfasalazine   Social History   Socioeconomic History  . Marital status: Widowed    Spouse name: Not on file  . Number of children: Not on file  . Years of education: Not on file  . Highest education level: Not on file  Occupational History  . Not on file  Tobacco Use  . Smoking status: Former Smoker    Quit date: 08/24/1993    Years since quitting: 26.0  . Smokeless tobacco: Never Used  Substance and Sexual Activity  . Alcohol use: Yes    Comment: occasional  .  Drug use: Never  . Sexual activity: Not on file  Other Topics Concern  . Not on file  Social History Narrative  . Not on file   Social Determinants of Health   Financial Resource Strain:   . Difficulty of Paying Living Expenses:   Food Insecurity:   . Worried About Charity fundraiser in the Last Year:   . Arboriculturist in the Last Year:   Transportation Needs:   . Film/video editor (Medical):   Marland Kitchen Lack of Transportation (Non-Medical):   Physical Activity:   . Days of Exercise per Week:   . Minutes of Exercise per Session:   Stress:   . Feeling of Stress :   Social Connections:   . Frequency of Communication with Friends and Family:   . Frequency of  Social Gatherings with Friends and Family:   . Attends Religious Services:   . Active Member of Clubs or Organizations:   . Attends Archivist Meetings:   Marland Kitchen Marital Status:      Family History: The patient's family history includes Cancer in her brother; Osteoarthritis in her mother; Other in her father; Rheum arthritis in her mother. ROS:   Please see the history of present illness.    All 14 point review of systems negative except as described per history of present illness  EKGs/Labs/Other Studies Reviewed:      Recent Labs: 07/12/2019: Hemoglobin 13.7; Platelets 235 08/23/2019: NT-Pro BNP 5,922 08/31/2019: BUN 18; Creatinine, Ser 1.04; Potassium 4.2; Sodium 139  Recent Lipid Panel No results found for: CHOL, TRIG, HDL, CHOLHDL, VLDL, LDLCALC, LDLDIRECT  Physical Exam:    VS:  BP (!) 112/58 (BP Location: Left Arm, Patient Position: Sitting, Cuff Size: Normal)   Pulse (!) 57   Ht 5' (1.524 m)   Wt 164 lb 12.8 oz (74.8 kg)   SpO2 95%   BMI 32.19 kg/m     Wt Readings from Last 3 Encounters:  09/14/19 164 lb 12.8 oz (74.8 kg)  08/23/19 168 lb 12.8 oz (76.6 kg)  07/31/19 164 lb (74.4 kg)     GEN:  Well nourished, well developed in no acute distress HEENT: Normal NECK: No JVD; No carotid bruits LYMPHATICS: No lymphadenopathy CARDIAC: RRR, no murmurs, no rubs, no gallops RESPIRATORY:  Clear to auscultation without rales, wheezing or rhonchi  ABDOMEN: Soft, non-tender, non-distended MUSCULOSKELETAL:  No edema; No deformity  SKIN: Warm and dry LOWER EXTREMITIES: no swelling NEUROLOGIC:  Alert and oriented x 3 PSYCHIATRIC:  Normal affect   ASSESSMENT:    1. HFrEF (heart failure with reduced ejection fraction) ([place])   2. Nonischemic cardiomyopathy ([place])   3. Dyspnea on exertion    PLAN:    In order of problems listed above:  1. HFrEF: Return to control guideline directed medical therapy.  Some difficulties because of low blood pressure as well as some  financial restraint.  We will double the dose of losartan to 50 twice daily, will continue rest of her medication, Chem-7 will be done within the next few days. 2. Nonischemic cardiomyopathy plan as outlined above.  On appropriate medication for few months and then repeat echocardiogram. 3. Dyspnea on exertion: Denies having any respiratory distress 4.  5. HPI which showed last HDL of 40 and LDL 131.  She is on pravastatin only and does not want to do anything about increasing this medication this discussion with her continue.   Medication Adjustments/Labs and Tests Ordered: Current medicines are reviewed  at length with the patient today.  Concerns regarding medicines are outlined above.  No orders of the defined types were placed in this encounter.  Medication changes: No orders of the defined types were placed in this encounter.   Signed, Park Liter, MD, [facility] 09/14/2019 3:04 PM    [facility]

## 2019-09-17 ENCOUNTER — Ambulatory Visit (INDEPENDENT_AMBULATORY_CARE_PROVIDER_SITE_OTHER): Payer: Medicare Other | Admitting: *Deleted

## 2019-09-17 ENCOUNTER — Other Ambulatory Visit: Payer: Self-pay

## 2019-09-17 DIAGNOSIS — T63441D Toxic effect of venom of bees, accidental (unintentional), subsequent encounter: Secondary | ICD-10-CM | POA: Diagnosis not present

## 2019-09-17 DIAGNOSIS — IMO0001 Reserved for inherently not codable concepts without codable children: Secondary | ICD-10-CM

## 2019-09-20 DIAGNOSIS — L602 Onychogryphosis: Secondary | ICD-10-CM | POA: Diagnosis not present

## 2019-09-21 DIAGNOSIS — I5020 Unspecified systolic (congestive) heart failure: Secondary | ICD-10-CM | POA: Diagnosis not present

## 2019-09-21 DIAGNOSIS — I428 Other cardiomyopathies: Secondary | ICD-10-CM | POA: Diagnosis not present

## 2019-09-21 DIAGNOSIS — L602 Onychogryphosis: Secondary | ICD-10-CM

## 2019-09-21 HISTORY — DX: Onychogryphosis: L60.2

## 2019-09-21 LAB — BASIC METABOLIC PANEL
BUN/Creatinine Ratio: 13 (ref 12–28)
BUN: 17 mg/dL (ref 8–27)
CO2: 21 mmol/L (ref 20–29)
Calcium: 9.2 mg/dL (ref 8.7–10.3)
Chloride: 107 mmol/L — ABNORMAL HIGH (ref 96–106)
Creatinine, Ser: 1.3 mg/dL — ABNORMAL HIGH (ref 0.57–1.00)
GFR calc Af Amer: 44 mL/min/{1.73_m2} — ABNORMAL LOW (ref >59)
GFR calc non Af Amer: 38 mL/min/{1.73_m2} — ABNORMAL LOW (ref >59)
Glucose: 84 mg/dL (ref 65–99)
Potassium: 5 mmol/L (ref 3.5–5.2)
Sodium: 141 mmol/L (ref 134–144)

## 2019-09-24 ENCOUNTER — Telehealth: Payer: Self-pay | Admitting: Cardiology

## 2019-09-24 NOTE — Telephone Encounter (Addendum)
Transferred call to [name] 

## 2019-10-08 DIAGNOSIS — C8335 Diffuse large B-cell lymphoma, lymph nodes of inguinal region and lower limb: Secondary | ICD-10-CM

## 2019-10-19 ENCOUNTER — Ambulatory Visit (INDEPENDENT_AMBULATORY_CARE_PROVIDER_SITE_OTHER): Payer: Medicare Other | Admitting: Cardiology

## 2019-10-19 ENCOUNTER — Encounter: Payer: Self-pay | Admitting: Cardiology

## 2019-10-19 ENCOUNTER — Other Ambulatory Visit: Payer: Self-pay

## 2019-10-19 VITALS — BP 114/64 | HR 86 | Ht 61.0 in | Wt 166.6 lb

## 2019-10-19 DIAGNOSIS — E785 Hyperlipidemia, unspecified: Secondary | ICD-10-CM

## 2019-10-19 DIAGNOSIS — I5020 Unspecified systolic (congestive) heart failure: Secondary | ICD-10-CM | POA: Diagnosis not present

## 2019-10-19 DIAGNOSIS — R0609 Other forms of dyspnea: Secondary | ICD-10-CM

## 2019-10-19 DIAGNOSIS — R0600 Dyspnea, unspecified: Secondary | ICD-10-CM

## 2019-10-19 HISTORY — DX: Hyperlipidemia, unspecified: E78.5

## 2019-10-19 LAB — BASIC METABOLIC PANEL
BUN/Creatinine Ratio: 14 (ref 12–28)
BUN: 19 mg/dL (ref 8–27)
CO2: 24 mmol/L (ref 20–29)
Calcium: 9.6 mg/dL (ref 8.7–10.3)
Chloride: 101 mmol/L (ref 96–106)
Creatinine, Ser: 1.37 mg/dL — ABNORMAL HIGH (ref 0.57–1.00)
GFR calc Af Amer: 41 mL/min/{1.73_m2} — ABNORMAL LOW (ref >59)
GFR calc non Af Amer: 36 mL/min/{1.73_m2} — ABNORMAL LOW (ref >59)
Glucose: 59 mg/dL — ABNORMAL LOW (ref 65–99)
Potassium: 4.3 mmol/L (ref 3.5–5.2)
Sodium: 140 mmol/L (ref 134–144)

## 2019-10-19 NOTE — Progress Notes (Signed)
Cardiology Office Note:    Date:  10/19/2019   ID:  Alexis Stephens, DOB [birth year], MRN [id number]  PCP:  [name], MD  Cardiologist:  [name], MD    Referring MD: [name], MD   No chief complaint on file. I am doing great  History of Present Illness:    Alexis Stephens is a 84 y.o. female with past medical history significant for cardiomyopathy initially diagnosed about a year ago she was put on beta-blocker and ARB however after that never follow-up when she showed up in May she was in trouble she was short of breath quick look with echocardiogram was done at that time which showed ejection fraction 20 to 25%. I initiated appropriate medication which included Entresto, however she was unable to afford it therefore now she is on carvedilol as well as losartan. And doing great denies have any chest pain no shortness of breath no proximal nocturnal dyspnea no swelling of lower extremities. 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 She is thinking about starting doing pottery.  Past Medical History:  Diagnosis Date   Cancer ([place])    Stage 4 Non-Hodgkin Lymphoma, clear for the past year   Cataract    GERD (gastroesophageal reflux disease)    Hymenoptera allergy    Swelling     Past Surgical History:  Procedure Laterality Date   ABDOMINAL HYSTERECTOMY     ADENOIDECTOMY     APPENDECTOMY     CHOLECYSTECTOMY     EYE SURGERY     NEPHROLITHOTOMY Left    TONSILLECTOMY      Current Medications: Current Meds  Medication Sig   Acetaminophen (TYLENOL PO) Take by mouth as needed.   carvedilol (COREG) 6.25 MG tablet TAKE 1 TABLET(6.25 MG) BY MOUTH TWICE DAILY   cyclobenzaprine (FLEXERIL) 10 MG tablet Take 10 mg by mouth as needed.   EPINEPHrine (EPIPEN 2-PAK) 0.3 mg/0.3 mL IJ SOAJ injection Inject  0.3 mg into the muscle once.   furosemide (LASIX) 20 MG tablet Take 20 mg by mouth daily.   furosemide (LASIX) 40 MG tablet Take 40 mg by mouth daily.   losartan (COZAAR) 50 MG tablet Take 1 tablet (50 mg total) by mouth in the morning and at bedtime.   Multiple Vitamin (MULTIVITAMIN) capsule Take by mouth.   potassium chloride (KLOR-CON) 10 MEQ tablet Take 10 mEq by mouth daily.   Potassium Chloride ER 20 MEQ TBCR TAKE 2 TABLETS BY MOUTH DAILY   pravastatin (PRAVACHOL) 20 MG tablet Take 20 mg by mouth daily.     Allergies:   Ivp dye [iodinated diagnostic agents], Mixed vespid venom, Wasp venom, Bee venom, Furadantin [nitrofurantoin], Ibuprofen, Iodine, Iodine-131, Magnesium citrate, Penicillamine, Penicillins, Sulfa antibiotics, and Sulfasalazine   Social History   Socioeconomic History   Marital status: Widowed    Spouse name: Not on file   Number of children: Not on file   Years of education: Not on file   Highest education level: Not on file  Occupational History   Not on file  Tobacco Use   Smoking status: Former Smoker    Quit date: 08/24/1993    Years since quitting: 26.1   Smokeless tobacco: Never Used  Substance and Sexual Activity   Alcohol use: Yes    Comment: occasional   Drug use: Never  Sexual activity: Not on file  Other Topics Concern   Not on file  Social History Narrative   Not on file   Social Determinants of Health   Financial Resource Strain:    Difficulty of Paying Living Expenses: Not on file  Food Insecurity:    Worried About [place] in the Last Year: Not on file   Ran Out of Food in the Last Year: Not on file  Transportation Needs:    Lack of Transportation (Medical): Not on file   Lack of Transportation (Non-Medical): Not on file  Physical Activity:    Days of Exercise per Week: Not on file   Minutes of Exercise per Session: Not on file  Stress:    Feeling of Stress : Not on file  Social Connections:     Frequency of Communication with Friends and Family: Not on file   Frequency of Social Gatherings with Friends and Family: Not on file   Attends Religious Services: Not on file   Active Member of Clubs or Organizations: Not on file   Attends Archivist Meetings: Not on file   Marital Status: Not on file     Family History: The patient's family history includes Cancer in her brother; Osteoarthritis in her mother; Other in her father; Rheum arthritis in her mother. ROS:   Please see the history of present illness.    All 14 point review of systems negative except as described per history of present illness  EKGs/Labs/Other Studies Reviewed:      Recent Labs: 07/12/2019: Hemoglobin 13.7; Platelets 235 08/23/2019: NT-Pro BNP 5,922 09/21/2019: BUN 17; Creatinine, Ser 1.30; Potassium 5.0; Sodium 141  Recent Lipid Panel No results found for: CHOL, TRIG, HDL, CHOLHDL, VLDL, LDLCALC, LDLDIRECT  Physical Exam:    VS:  BP 114/64    Pulse 86    Ht 5\' 1"  (1.549 m)    Wt 166 lb 9.6 oz (75.6 kg)    SpO2 98%    BMI 31.48 kg/m     Wt Readings from Last 3 Encounters:  10/19/19 166 lb 9.6 oz (75.6 kg)  09/14/19 164 lb 12.8 oz (74.8 kg)  08/23/19 168 lb 12.8 oz (76.6 kg)     GEN:  Well nourished, well developed in no acute distress HEENT: Normal NECK: No JVD; No carotid bruits LYMPHATICS: No lymphadenopathy CARDIAC: RRR, no murmurs, no rubs, no gallops RESPIRATORY:  Clear to auscultation without rales, wheezing or rhonchi  ABDOMEN: Soft, non-tender, non-distended MUSCULOSKELETAL:  No edema; No deformity  SKIN: Warm and dry LOWER EXTREMITIES: no swelling NEUROLOGIC:  Alert and oriented x 3 PSYCHIATRIC:  Normal affect   ASSESSMENT:    1. HFrEF (heart failure with reduced ejection fraction) ([place])   2. Dyspnea on exertion   3. Dyslipidemia    PLAN:    In order of problems listed above:  1. Heart failure New [place] Heart Association class I/II, doing well. Will recheck  her echocardiogram. Actually after a quick look in May she never had full echocardiogram done. We will do the test. I will check her Chem-7 if Chem-7 is acceptable will initiate Aldactone. She still does not want to go on Entresto is simply too expensive for her. 2. Dyspnea on exertion: Denies having any. 3. Dyslipidemia: Still a problem. I did review K PN her LDL was 131. She is on Pravachol 20 and she does not want to augment this therapy. 4. Lymphoma just seen her oncologist who gave her good report.  Overall she appears to be hemodynamically compensated. We will repeat echocardiogram to recheck left ventricle ejection fraction and see if there is any need for more advanced therapy. Also check Chem-7 if Chem-7 is fine will add Aldactone.   Medication Adjustments/Labs and Tests Ordered: Current medicines are reviewed at length with the patient today.  Concerns regarding medicines are outlined above.  No orders of the defined types were placed in this encounter.  Medication changes: No orders of the defined types were placed in this encounter.   Signed, Park Liter, MD, [facility] 10/19/2019 9:00 AM    [place]

## 2019-10-19 NOTE — Patient Instructions (Signed)
Medication Instructions:  Your physician recommends that you continue on your current medications as directed. Please refer to the Current Medication list given to you today.  *If you need a refill on your cardiac medications before your next appointment, please call your pharmacy*   Lab Work: Your physician recommends that you return for lab work today: bmp   If you have labs (blood work) drawn today and your tests are completely normal, you will receive your results only by: Marland Kitchen MyChart Message (if you have MyChart) OR . A paper copy in the mail If you have any lab test that is abnormal or we need to change your treatment, we will call you to review the results.   Testing/Procedures: Your physician has requested that you have an echocardiogram. Echocardiography is a painless test that uses sound waves to create images of your heart. It provides your doctor with information about the size and shape of your heart and how well your heart's chambers and valves are working. This procedure takes approximately one hour. There are no restrictions for this procedure.     Follow-Up: At [facility], you and your health needs are our priority.  As part of our continuing mission to provide you with exceptional heart care, we have created designated Provider Care Teams.  These Care Teams include your primary Cardiologist (physician) and Advanced Practice Providers (APPs -  Physician Assistants and Nurse Practitioners) who all work together to provide you with the care you need, when you need it.  We recommend signing up for the patient portal called "MyChart".  Sign up information is provided on this After Visit Summary.  MyChart is used to connect with patients for Virtual Visits (Telemedicine).  Patients are able to view lab/test results, encounter notes, upcoming appointments, etc.  Non-urgent messages can be sent to your provider as well.   To learn more about what you can do with MyChart, go to  [email or web address].    Your next appointment:   3 month(s)  The format for your next appointment:   In Person  Provider:   [name], MD   Other Instructions   Echocardiogram An echocardiogram is a procedure that uses painless sound waves (ultrasound) to produce an image of the heart. Images from an echocardiogram can provide important information about:  Signs of coronary artery disease (CAD).  Aneurysm detection. An aneurysm is a weak or damaged part of an artery wall that bulges out from the normal force of blood pumping through the body.  Heart size and shape. Changes in the size or shape of the heart can be associated with certain conditions, including heart failure, aneurysm, and CAD.  Heart muscle function.  Heart valve function.  Signs of a past heart attack.  Fluid buildup around the heart.  Thickening of the heart muscle.  A tumor or infectious growth around the heart valves. Tell a health care provider about:  Any allergies you have.  All medicines you are taking, including vitamins, herbs, eye drops, creams, and over-the-counter medicines.  Any blood disorders you have.  Any surgeries you have had.  Any medical conditions you have.  Whether you are pregnant or may be pregnant. What are the risks? Generally, this is a safe procedure. However, problems may occur, including:  Allergic reaction to dye (contrast) that may be used during the procedure. What happens before the procedure? No specific preparation is needed. You may eat and drink normally. What happens during the procedure?   An IV tube  may be inserted into one of your veins.  You may receive contrast through this tube. A contrast is an injection that improves the quality of the pictures from your heart.  A gel will be applied to your chest.  A wand-like tool (transducer) will be moved over your chest. The gel will help to transmit the sound waves from the  transducer.  The sound waves will harmlessly bounce off of your heart to allow the heart images to be captured in real-time motion. The images will be recorded on a computer. The procedure may vary among health care providers and hospitals. What happens after the procedure?  You may return to your normal, everyday life, including diet, activities, and medicines, unless your health care provider tells you not to do that. Summary  An echocardiogram is a procedure that uses painless sound waves (ultrasound) to produce an image of the heart.  Images from an echocardiogram can provide important information about the size and shape of your heart, heart muscle function, heart valve function, and fluid buildup around your heart.  You do not need to do anything to prepare before this procedure. You may eat and drink normally.  After the echocardiogram is completed, you may return to your normal, everyday life, unless your health care provider tells you not to do that. This information is not intended to replace advice given to you by your health care provider. Make sure you discuss any questions you have with your health care provider. Document Revised: 06/08/2018 Document Reviewed: 03/20/2016 Elsevier Patient Education  2020 Elsevier Inc.   

## 2019-10-26 ENCOUNTER — Encounter: Payer: Self-pay | Admitting: Emergency Medicine

## 2019-11-08 DIAGNOSIS — M6281 Muscle weakness (generalized): Secondary | ICD-10-CM | POA: Diagnosis not present

## 2019-11-08 DIAGNOSIS — C8335 Diffuse large B-cell lymphoma, lymph nodes of inguinal region and lower limb: Secondary | ICD-10-CM | POA: Diagnosis not present

## 2019-11-08 DIAGNOSIS — M25561 Pain in right knee: Secondary | ICD-10-CM | POA: Diagnosis not present

## 2019-11-08 DIAGNOSIS — M2560 Stiffness of unspecified joint, not elsewhere classified: Secondary | ICD-10-CM | POA: Diagnosis not present

## 2019-11-08 DIAGNOSIS — M25511 Pain in right shoulder: Secondary | ICD-10-CM | POA: Diagnosis not present

## 2019-11-08 DIAGNOSIS — R293 Abnormal posture: Secondary | ICD-10-CM | POA: Diagnosis not present

## 2019-11-08 DIAGNOSIS — M25562 Pain in left knee: Secondary | ICD-10-CM | POA: Diagnosis not present

## 2019-11-08 DIAGNOSIS — M545 Low back pain: Secondary | ICD-10-CM | POA: Diagnosis not present

## 2019-11-08 DIAGNOSIS — M25661 Stiffness of right knee, not elsewhere classified: Secondary | ICD-10-CM | POA: Diagnosis not present

## 2019-11-08 DIAGNOSIS — R2689 Other abnormalities of gait and mobility: Secondary | ICD-10-CM | POA: Diagnosis not present

## 2019-11-08 DIAGNOSIS — M25662 Stiffness of left knee, not elsewhere classified: Secondary | ICD-10-CM | POA: Diagnosis not present

## 2019-11-09 ENCOUNTER — Ambulatory Visit (INDEPENDENT_AMBULATORY_CARE_PROVIDER_SITE_OTHER): Payer: Medicare Other

## 2019-11-09 ENCOUNTER — Encounter: Payer: Self-pay | Admitting: Emergency Medicine

## 2019-11-09 ENCOUNTER — Other Ambulatory Visit: Payer: Self-pay

## 2019-11-09 DIAGNOSIS — R0609 Other forms of dyspnea: Secondary | ICD-10-CM

## 2019-11-09 DIAGNOSIS — I5020 Unspecified systolic (congestive) heart failure: Secondary | ICD-10-CM

## 2019-11-09 DIAGNOSIS — R0600 Dyspnea, unspecified: Secondary | ICD-10-CM

## 2019-11-09 LAB — ECHOCARDIOGRAM COMPLETE
Area-P 1/2: 4.1 cm2
Calc EF: 32.6 %
S' Lateral: 4.4 cm
Single Plane A2C EF: 20.9 %
Single Plane A4C EF: 37.2 %

## 2019-11-09 NOTE — Progress Notes (Signed)
Complete echocardiogram performed.  [name] RDCS, RVT  

## 2019-11-12 ENCOUNTER — Ambulatory Visit (INDEPENDENT_AMBULATORY_CARE_PROVIDER_SITE_OTHER): Payer: Medicare Other | Admitting: *Deleted

## 2019-11-12 ENCOUNTER — Other Ambulatory Visit: Payer: Self-pay

## 2019-11-12 DIAGNOSIS — T63441D Toxic effect of venom of bees, accidental (unintentional), subsequent encounter: Secondary | ICD-10-CM | POA: Diagnosis not present

## 2019-11-12 DIAGNOSIS — IMO0001 Reserved for inherently not codable concepts without codable children: Secondary | ICD-10-CM

## 2019-11-13 DIAGNOSIS — M2560 Stiffness of unspecified joint, not elsewhere classified: Secondary | ICD-10-CM | POA: Diagnosis not present

## 2019-11-13 DIAGNOSIS — M545 Low back pain: Secondary | ICD-10-CM | POA: Diagnosis not present

## 2019-11-13 DIAGNOSIS — M25562 Pain in left knee: Secondary | ICD-10-CM | POA: Diagnosis not present

## 2019-11-13 DIAGNOSIS — M25511 Pain in right shoulder: Secondary | ICD-10-CM | POA: Diagnosis not present

## 2019-11-13 DIAGNOSIS — M25561 Pain in right knee: Secondary | ICD-10-CM | POA: Diagnosis not present

## 2019-11-13 DIAGNOSIS — C8335 Diffuse large B-cell lymphoma, lymph nodes of inguinal region and lower limb: Secondary | ICD-10-CM | POA: Diagnosis not present

## 2019-11-20 DIAGNOSIS — M545 Low back pain: Secondary | ICD-10-CM | POA: Diagnosis not present

## 2019-11-20 DIAGNOSIS — M25562 Pain in left knee: Secondary | ICD-10-CM | POA: Diagnosis not present

## 2019-11-20 DIAGNOSIS — M25511 Pain in right shoulder: Secondary | ICD-10-CM | POA: Diagnosis not present

## 2019-11-20 DIAGNOSIS — M2560 Stiffness of unspecified joint, not elsewhere classified: Secondary | ICD-10-CM | POA: Diagnosis not present

## 2019-11-20 DIAGNOSIS — C8335 Diffuse large B-cell lymphoma, lymph nodes of inguinal region and lower limb: Secondary | ICD-10-CM | POA: Diagnosis not present

## 2019-11-20 DIAGNOSIS — M25561 Pain in right knee: Secondary | ICD-10-CM | POA: Diagnosis not present

## 2019-11-27 DIAGNOSIS — M25561 Pain in right knee: Secondary | ICD-10-CM | POA: Diagnosis not present

## 2019-11-27 DIAGNOSIS — C8335 Diffuse large B-cell lymphoma, lymph nodes of inguinal region and lower limb: Secondary | ICD-10-CM | POA: Diagnosis not present

## 2019-11-27 DIAGNOSIS — M545 Low back pain: Secondary | ICD-10-CM | POA: Diagnosis not present

## 2019-11-27 DIAGNOSIS — M25511 Pain in right shoulder: Secondary | ICD-10-CM | POA: Diagnosis not present

## 2019-11-27 DIAGNOSIS — M2560 Stiffness of unspecified joint, not elsewhere classified: Secondary | ICD-10-CM | POA: Diagnosis not present

## 2019-11-27 DIAGNOSIS — M25562 Pain in left knee: Secondary | ICD-10-CM | POA: Diagnosis not present

## 2019-11-29 DIAGNOSIS — M25511 Pain in right shoulder: Secondary | ICD-10-CM | POA: Diagnosis not present

## 2019-11-29 DIAGNOSIS — L602 Onychogryphosis: Secondary | ICD-10-CM | POA: Diagnosis not present

## 2019-11-29 DIAGNOSIS — M25562 Pain in left knee: Secondary | ICD-10-CM | POA: Diagnosis not present

## 2019-11-29 DIAGNOSIS — M545 Low back pain: Secondary | ICD-10-CM | POA: Diagnosis not present

## 2019-11-29 DIAGNOSIS — M25561 Pain in right knee: Secondary | ICD-10-CM | POA: Diagnosis not present

## 2019-11-29 DIAGNOSIS — M2560 Stiffness of unspecified joint, not elsewhere classified: Secondary | ICD-10-CM | POA: Diagnosis not present

## 2019-11-29 DIAGNOSIS — C8335 Diffuse large B-cell lymphoma, lymph nodes of inguinal region and lower limb: Secondary | ICD-10-CM | POA: Diagnosis not present

## 2019-12-11 DIAGNOSIS — M25662 Stiffness of left knee, not elsewhere classified: Secondary | ICD-10-CM | POA: Diagnosis not present

## 2019-12-11 DIAGNOSIS — R2689 Other abnormalities of gait and mobility: Secondary | ICD-10-CM | POA: Diagnosis not present

## 2019-12-11 DIAGNOSIS — M545 Low back pain, unspecified: Secondary | ICD-10-CM | POA: Diagnosis not present

## 2019-12-11 DIAGNOSIS — M25562 Pain in left knee: Secondary | ICD-10-CM | POA: Diagnosis not present

## 2019-12-11 DIAGNOSIS — C8335 Diffuse large B-cell lymphoma, lymph nodes of inguinal region and lower limb: Secondary | ICD-10-CM | POA: Diagnosis not present

## 2019-12-11 DIAGNOSIS — M6281 Muscle weakness (generalized): Secondary | ICD-10-CM | POA: Diagnosis not present

## 2019-12-11 DIAGNOSIS — M25561 Pain in right knee: Secondary | ICD-10-CM | POA: Diagnosis not present

## 2019-12-11 DIAGNOSIS — R293 Abnormal posture: Secondary | ICD-10-CM | POA: Diagnosis not present

## 2019-12-11 DIAGNOSIS — M2560 Stiffness of unspecified joint, not elsewhere classified: Secondary | ICD-10-CM | POA: Diagnosis not present

## 2019-12-11 DIAGNOSIS — M25661 Stiffness of right knee, not elsewhere classified: Secondary | ICD-10-CM | POA: Diagnosis not present

## 2019-12-11 DIAGNOSIS — M25511 Pain in right shoulder: Secondary | ICD-10-CM | POA: Diagnosis not present

## 2019-12-25 DIAGNOSIS — M545 Low back pain, unspecified: Secondary | ICD-10-CM | POA: Diagnosis not present

## 2019-12-25 DIAGNOSIS — C8335 Diffuse large B-cell lymphoma, lymph nodes of inguinal region and lower limb: Secondary | ICD-10-CM | POA: Diagnosis not present

## 2019-12-25 DIAGNOSIS — M25511 Pain in right shoulder: Secondary | ICD-10-CM | POA: Diagnosis not present

## 2019-12-25 DIAGNOSIS — M2560 Stiffness of unspecified joint, not elsewhere classified: Secondary | ICD-10-CM | POA: Diagnosis not present

## 2019-12-25 DIAGNOSIS — M25562 Pain in left knee: Secondary | ICD-10-CM | POA: Diagnosis not present

## 2019-12-25 DIAGNOSIS — M25561 Pain in right knee: Secondary | ICD-10-CM | POA: Diagnosis not present

## 2019-12-27 DIAGNOSIS — M25562 Pain in left knee: Secondary | ICD-10-CM | POA: Diagnosis not present

## 2019-12-27 DIAGNOSIS — M2560 Stiffness of unspecified joint, not elsewhere classified: Secondary | ICD-10-CM | POA: Diagnosis not present

## 2019-12-27 DIAGNOSIS — M545 Low back pain, unspecified: Secondary | ICD-10-CM | POA: Diagnosis not present

## 2019-12-27 DIAGNOSIS — M25511 Pain in right shoulder: Secondary | ICD-10-CM | POA: Diagnosis not present

## 2019-12-27 DIAGNOSIS — C8335 Diffuse large B-cell lymphoma, lymph nodes of inguinal region and lower limb: Secondary | ICD-10-CM | POA: Diagnosis not present

## 2019-12-27 DIAGNOSIS — M25561 Pain in right knee: Secondary | ICD-10-CM | POA: Diagnosis not present

## 2020-01-03 DIAGNOSIS — M25662 Stiffness of left knee, not elsewhere classified: Secondary | ICD-10-CM | POA: Diagnosis not present

## 2020-01-03 DIAGNOSIS — M6281 Muscle weakness (generalized): Secondary | ICD-10-CM | POA: Diagnosis not present

## 2020-01-03 DIAGNOSIS — M2560 Stiffness of unspecified joint, not elsewhere classified: Secondary | ICD-10-CM | POA: Diagnosis not present

## 2020-01-03 DIAGNOSIS — M25661 Stiffness of right knee, not elsewhere classified: Secondary | ICD-10-CM | POA: Diagnosis not present

## 2020-01-03 DIAGNOSIS — M5459 Other low back pain: Secondary | ICD-10-CM | POA: Diagnosis not present

## 2020-01-03 DIAGNOSIS — R2689 Other abnormalities of gait and mobility: Secondary | ICD-10-CM | POA: Diagnosis not present

## 2020-01-03 DIAGNOSIS — M25561 Pain in right knee: Secondary | ICD-10-CM | POA: Diagnosis not present

## 2020-01-03 DIAGNOSIS — R293 Abnormal posture: Secondary | ICD-10-CM | POA: Diagnosis not present

## 2020-01-03 DIAGNOSIS — C8335 Diffuse large B-cell lymphoma, lymph nodes of inguinal region and lower limb: Secondary | ICD-10-CM | POA: Diagnosis not present

## 2020-01-03 DIAGNOSIS — M25562 Pain in left knee: Secondary | ICD-10-CM | POA: Diagnosis not present

## 2020-01-03 DIAGNOSIS — M25511 Pain in right shoulder: Secondary | ICD-10-CM | POA: Diagnosis not present

## 2020-01-07 ENCOUNTER — Other Ambulatory Visit: Payer: Self-pay

## 2020-01-07 ENCOUNTER — Ambulatory Visit (INDEPENDENT_AMBULATORY_CARE_PROVIDER_SITE_OTHER): Payer: Medicare Other | Admitting: *Deleted

## 2020-01-07 DIAGNOSIS — T63441D Toxic effect of venom of bees, accidental (unintentional), subsequent encounter: Secondary | ICD-10-CM

## 2020-01-07 DIAGNOSIS — T6394XD Toxic effect of contact with unspecified venomous animal, undetermined, subsequent encounter: Secondary | ICD-10-CM

## 2020-01-10 DIAGNOSIS — M25561 Pain in right knee: Secondary | ICD-10-CM | POA: Diagnosis not present

## 2020-01-10 DIAGNOSIS — M2560 Stiffness of unspecified joint, not elsewhere classified: Secondary | ICD-10-CM | POA: Diagnosis not present

## 2020-01-10 DIAGNOSIS — C8335 Diffuse large B-cell lymphoma, lymph nodes of inguinal region and lower limb: Secondary | ICD-10-CM | POA: Diagnosis not present

## 2020-01-10 DIAGNOSIS — M25562 Pain in left knee: Secondary | ICD-10-CM | POA: Diagnosis not present

## 2020-01-10 DIAGNOSIS — M25511 Pain in right shoulder: Secondary | ICD-10-CM | POA: Diagnosis not present

## 2020-01-10 DIAGNOSIS — M25661 Stiffness of right knee, not elsewhere classified: Secondary | ICD-10-CM | POA: Diagnosis not present

## 2020-01-16 DIAGNOSIS — M25561 Pain in right knee: Secondary | ICD-10-CM | POA: Diagnosis not present

## 2020-01-16 DIAGNOSIS — M25661 Stiffness of right knee, not elsewhere classified: Secondary | ICD-10-CM | POA: Diagnosis not present

## 2020-01-16 DIAGNOSIS — M25511 Pain in right shoulder: Secondary | ICD-10-CM | POA: Diagnosis not present

## 2020-01-16 DIAGNOSIS — M25562 Pain in left knee: Secondary | ICD-10-CM | POA: Diagnosis not present

## 2020-01-16 DIAGNOSIS — C8335 Diffuse large B-cell lymphoma, lymph nodes of inguinal region and lower limb: Secondary | ICD-10-CM | POA: Diagnosis not present

## 2020-01-16 DIAGNOSIS — M2560 Stiffness of unspecified joint, not elsewhere classified: Secondary | ICD-10-CM | POA: Diagnosis not present

## 2020-01-17 DIAGNOSIS — K219 Gastro-esophageal reflux disease without esophagitis: Secondary | ICD-10-CM | POA: Insufficient documentation

## 2020-01-17 DIAGNOSIS — H269 Unspecified cataract: Secondary | ICD-10-CM | POA: Insufficient documentation

## 2020-01-17 DIAGNOSIS — C801 Malignant (primary) neoplasm, unspecified: Secondary | ICD-10-CM | POA: Insufficient documentation

## 2020-01-17 DIAGNOSIS — R609 Edema, unspecified: Secondary | ICD-10-CM | POA: Insufficient documentation

## 2020-01-17 DIAGNOSIS — Z91038 Other insect allergy status: Secondary | ICD-10-CM | POA: Insufficient documentation

## 2020-01-18 ENCOUNTER — Other Ambulatory Visit: Payer: Self-pay

## 2020-01-18 ENCOUNTER — Encounter: Payer: Self-pay | Admitting: Cardiology

## 2020-01-18 ENCOUNTER — Ambulatory Visit (INDEPENDENT_AMBULATORY_CARE_PROVIDER_SITE_OTHER): Payer: Medicare Other | Admitting: Cardiology

## 2020-01-18 VITALS — BP 120/92 | HR 96 | Ht 61.0 in | Wt 173.0 lb

## 2020-01-18 DIAGNOSIS — E785 Hyperlipidemia, unspecified: Secondary | ICD-10-CM

## 2020-01-18 DIAGNOSIS — R0600 Dyspnea, unspecified: Secondary | ICD-10-CM | POA: Diagnosis not present

## 2020-01-18 DIAGNOSIS — R0609 Other forms of dyspnea: Secondary | ICD-10-CM

## 2020-01-18 DIAGNOSIS — I5020 Unspecified systolic (congestive) heart failure: Secondary | ICD-10-CM | POA: Diagnosis not present

## 2020-01-18 DIAGNOSIS — I428 Other cardiomyopathies: Secondary | ICD-10-CM

## 2020-01-18 LAB — BASIC METABOLIC PANEL
BUN/Creatinine Ratio: 16 (ref 12–28)
BUN: 20 mg/dL (ref 8–27)
CO2: 24 mmol/L (ref 20–29)
Calcium: 9.6 mg/dL (ref 8.7–10.3)
Chloride: 104 mmol/L (ref 96–106)
Creatinine, Ser: 1.28 mg/dL — ABNORMAL HIGH (ref 0.57–1.00)
GFR calc Af Amer: 45 mL/min/{1.73_m2} — ABNORMAL LOW (ref >59)
GFR calc non Af Amer: 39 mL/min/{1.73_m2} — ABNORMAL LOW (ref >59)
Glucose: 82 mg/dL (ref 65–99)
Potassium: 5.1 mmol/L (ref 3.5–5.2)
Sodium: 141 mmol/L (ref 134–144)

## 2020-01-18 LAB — LIPID PANEL
Chol/HDL Ratio: 4.9 ratio — ABNORMAL HIGH (ref 0.0–4.4)
Cholesterol, Total: 215 mg/dL — ABNORMAL HIGH (ref 100–199)
HDL: 44 mg/dL (ref >39)
LDL Chol Calc (NIH): 146 mg/dL — ABNORMAL HIGH (ref 0–99)
Triglycerides: 137 mg/dL (ref 0–149)
VLDL Cholesterol Cal: 25 mg/dL (ref 5–40)

## 2020-01-18 MED ORDER — POTASSIUM CHLORIDE ER 20 MEQ PO TBCR: 3.0000 | EXTENDED_RELEASE_TABLET | Freq: Every day | ORAL | 3 refills | Status: DC

## 2020-01-18 MED ORDER — FUROSEMIDE 40 MG PO TABS
60.0000 mg | ORAL_TABLET | Freq: Every day | ORAL | 5 refills | Status: DC
Start: 2020-01-18 — End: 2020-02-08

## 2020-01-18 MED ORDER — CARVEDILOL 6.25 MG PO TABS: ORAL_TABLET | ORAL | 3 refills | Status: DC

## 2020-01-18 NOTE — Patient Instructions (Signed)
Medication Instructions:  Your physician recommends that you continue on your current medications as directed. Please refer to the Current Medication list given to you today.  *If you need a refill on your cardiac medications before your next appointment, please call your pharmacy*   Lab Work: Your physician recommends that you return for lab work today: lipid, bmp   If you have labs (blood work) drawn today and your tests are completely normal, you will receive your results only by: Marland Kitchen MyChart Message (if you have MyChart) OR . A paper copy in the mail If you have any lab test that is abnormal or we need to change your treatment, we will call you to review the results.   Testing/Procedures: None.   Follow-Up: At [facility], you and your health needs are our priority.  As part of our continuing mission to provide you with exceptional heart care, we have created designated Provider Care Teams.  These Care Teams include your primary Cardiologist (physician) and Advanced Practice Providers (APPs -  Physician Assistants and Nurse Practitioners) who all work together to provide you with the care you need, when you need it.  We recommend signing up for the patient portal called "MyChart".  Sign up information is provided on this After Visit Summary.  MyChart is used to connect with patients for Virtual Visits (Telemedicine).  Patients are able to view lab/test results, encounter notes, upcoming appointments, etc.  Non-urgent messages can be sent to your provider as well.   To learn more about what you can do with MyChart, go to [email or web address].    Your next appointment:   3 month(s)  The format for your next appointment:   In Person  Provider:   [name], MD   Other Instructions

## 2020-01-18 NOTE — Progress Notes (Signed)
Cardiology Office Note:    Date:  01/18/2020   ID:  Alexis Stephens, DOB 09/22/1935, MRN [id number]  PCP:  [name], MD  Cardiologist:  [name], MD    Referring MD: [name], MD   Chief Complaint  Patient presents with  . Follow-up  I am doing fine but still have some shortness of breath  History of Present Illness:    Alexis Stephens is a 84 y.o. female with past medical history significant for what appears to be nonischemic cardiomyopathy, stress test done is negative, initial ejection fraction 20 to 25%.  We started appropriate treatment with beta-blocker as well as Entresto however she was unable to afford Entresto we have to switch her to losartan.  Now she is taking losartan as well as beta-blocker.  Also diuretic has been given and she seems to be doing well.  She is New [place] Heart Association class II.  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  She is busy taking care of her house and overall says she is doing well.  Denies having the swelling of lower extremities she does have some exertional shortness of breath but no chest pain tightness squeezing pressure burning chest no dizziness no passing out.  We repeated echocardiogram recently which showed ejection fraction 30 to 35% and I brought her to the office to talk about augmenting of her therapy.  Past Medical History:  Diagnosis Date  . Anaphylaxis due to hymenoptera venom 10/30/2014  . BMI 35.0-35.9,adult 02/10/2016  . Cancer [facility])    Stage 4 Non-Hodgkin Lymphoma, clear for the past year  . Cataract   . Dyslipidemia 10/19/2019  . Dyspnea on exertion 10/30/2015  . GERD (gastroesophageal reflux disease)   . HFrEF (heart failure with reduced ejection fraction) ([place]) 11/25/2017  . Hymenoptera allergy   . Left hip pain 02/10/2016  . Lymphadenopathy, inguinal 02/10/2016  . Nonischemic cardiomyopathy  ([place]) 11/25/2017  . Overgrown toenails 09/21/2019  . Palpitations 10/30/2015  . Postoperative examination 02/17/2016  . Swelling   . Tachycardia 10/30/2015    Past Surgical History:  Procedure Laterality Date  . ABDOMINAL HYSTERECTOMY    . ADENOIDECTOMY    . APPENDECTOMY    . CHOLECYSTECTOMY    . EYE SURGERY    . NEPHROLITHOTOMY Left   . TONSILLECTOMY      Current Medications: Current Meds  Medication Sig  . Acetaminophen (TYLENOL PO) Take by mouth as needed.  . carvedilol (COREG) 6.25 MG tablet TAKE 1 TABLET(6.25 MG) BY MOUTH TWICE DAILY  . cyclobenzaprine (FLEXERIL) 10 MG tablet Take 10 mg by mouth as needed.  Marland Kitchen EPINEPHrine (EPIPEN 2-PAK) 0.3 mg/0.3 mL IJ SOAJ injection Inject 0.3 mg into the muscle once.  . furosemide (LASIX) 40 MG tablet Take 40 mg by mouth daily.  Marland Kitchen losartan (COZAAR) 50 MG tablet Take 1 tablet (50 mg total) by mouth in the morning and at bedtime.  . Multiple Vitamin (MULTIVITAMIN) capsule Take by mouth.  . Potassium Chloride ER 20 MEQ TBCR TAKE 2 TABLETS BY MOUTH DAILY  . pravastatin (PRAVACHOL) 20 MG tablet Take 20 mg by mouth daily.     Allergies:   Ivp dye [iodinated diagnostic agents], Mixed vespid venom, Wasp venom, Bee venom, Furadantin [nitrofurantoin], Ibuprofen, Iodine, Iodine-131, Magnesium citrate, Penicillamine, Penicillins, Sulfa antibiotics, and Sulfasalazine   Social History   Socioeconomic History  . Marital status:  Widowed    Spouse name: Not on file  . Number of children: Not on file  . Years of education: Not on file  . Highest education level: Not on file  Occupational History  . Not on file  Tobacco Use  . Smoking status: Former Smoker    Quit date: 08/24/1993    Years since quitting: 26.4  . Smokeless tobacco: Never Used  Substance and Sexual Activity  . Alcohol use: Yes    Comment: occasional  . Drug use: Never  . Sexual activity: Not on file  Other Topics Concern  . Not on file  Social History Narrative  . Not on file     Social Determinants of Health   Financial Resource Strain:   . Difficulty of Paying Living Expenses: Not on file  Food Insecurity:   . Worried About Charity fundraiser in the Last Year: Not on file  . Ran Out of Food in the Last Year: Not on file  Transportation Needs:   . Lack of Transportation (Medical): Not on file  . Lack of Transportation (Non-Medical): Not on file  Physical Activity:   . Days of Exercise per Week: Not on file  . Minutes of Exercise per Session: Not on file  Stress:   . Feeling of Stress : Not on file  Social Connections:   . Frequency of Communication with Friends and Family: Not on file  . Frequency of Social Gatherings with Friends and Family: Not on file  . Attends Religious Services: Not on file  . Active Member of Clubs or Organizations: Not on file  . Attends Archivist Meetings: Not on file  . Marital Status: Not on file     Family History: The patient's family history includes Cancer in her brother; Osteoarthritis in her mother; Other in her father; Rheum arthritis in her mother. ROS:   Please see the history of present illness.    All 14 point review of systems negative except as described per history of present illness  EKGs/Labs/Other Studies Reviewed:      Recent Labs: 07/12/2019: Hemoglobin 13.7; Platelets 235 08/23/2019: NT-Pro BNP 5,922 10/19/2019: BUN 19; Creatinine, Ser 1.37; Potassium 4.3; Sodium 140  Recent Lipid Panel No results found for: CHOL, TRIG, HDL, CHOLHDL, VLDL, LDLCALC, LDLDIRECT  Physical Exam:    VS:  BP (!) 120/92 (BP Location: Left Arm, Patient Position: Sitting, Cuff Size: Normal)   Pulse 96   Ht 5\' 1"  (1.549 m)   Wt 173 lb (78.5 kg)   SpO2 95%   BMI 32.69 kg/m     Wt Readings from Last 3 Encounters:  01/18/20 173 lb (78.5 kg)  10/19/19 166 lb 9.6 oz (75.6 kg)  09/14/19 164 lb 12.8 oz (74.8 kg)     GEN:  Well nourished, well developed in no acute distress HEENT: Normal NECK: No JVD; No  carotid bruits LYMPHATICS: No lymphadenopathy CARDIAC: RRR, no murmurs, no rubs, no gallops RESPIRATORY:  Clear to auscultation without rales, wheezing or rhonchi  ABDOMEN: Soft, non-tender, non-distended MUSCULOSKELETAL:  No edema; No deformity  SKIN: Warm and dry LOWER EXTREMITIES: no swelling NEUROLOGIC:  Alert and oriented x 3 PSYCHIATRIC:  Normal affect   ASSESSMENT:    1. Nonischemic cardiomyopathy (HCC)   2. HFrEF (heart failure with reduced ejection fraction) ([place])   3. Dyslipidemia   4. Dyspnea on exertion    PLAN:    In order of problems listed above:  1. nonischemic cardiomyopathy on losartan 50  twice daily as well as carvedilol 6.25 twice daily.  I will do EKG today if EKG is fine will double the dose of carvedilol, I will also recheck her Chem-7 and based on that decide if we can add Aldactone to her medical regimen.  Because of persistently diminished left ventricle ejection fraction less than 35% she be referred to EP team for consideration of ICD implantation. 2.  Heart failure with reduced left ventricle ejection fraction.  Hemodynamically compensated plan is augment therapy with details as described above. 3. Dyslipidemia I did review her K PN from March 13, 2019 her LDL was 131 HDL 40.  I will recheck her fasting lipid profile today I dissipate need to augment her statin therapy as well.  She is only on pravastatin 20. 4.  Dyspnea on exertion.  Only mild.  EKG has been done which showed first-degree AV block, therefore, we will not increase dose of carvedilol.  Medication Adjustments/Labs and Tests Ordered: Current medicines are reviewed at length with the patient today.  Concerns regarding medicines are outlined above.  No orders of the defined types were placed in this encounter.  Medication changes: No orders of the defined types were placed in this encounter.   Signed, Park Liter, MD, [facility] 01/18/2020 8:48 AM    [place]

## 2020-01-22 ENCOUNTER — Telehealth: Payer: Self-pay | Admitting: Cardiology

## 2020-01-22 MED ORDER — PRAVASTATIN SODIUM 40 MG PO TABS: 40.0000 mg | ORAL_TABLET | Freq: Every evening | ORAL | 3 refills | Status: DC

## 2020-01-22 NOTE — Telephone Encounter (Signed)
I spoke with patient and reviewed lab results with her.  Patient reports she missed about 6 days of pravastatin prior to appointment as she was out of medication.  She will increase to 40 mg daily.  Will send prescription to [facility] on [address]

## 2020-01-22 NOTE — Telephone Encounter (Signed)
Alexis Stephens is returning [name]'s call. She is requesting a detailed VM with the results be left if she is unable to answer. Please advise.

## 2020-02-07 DIAGNOSIS — L602 Onychogryphosis: Secondary | ICD-10-CM | POA: Diagnosis not present

## 2020-02-08 ENCOUNTER — Telehealth: Payer: Self-pay | Admitting: Cardiology

## 2020-02-08 MED ORDER — FUROSEMIDE 40 MG PO TABS
60.0000 mg | ORAL_TABLET | Freq: Every day | ORAL | 3 refills | Status: DC
Start: 2020-02-08 — End: 2020-04-30

## 2020-02-08 NOTE — Telephone Encounter (Signed)
    *  STAT* If patient is at the pharmacy, call can be transferred to refill team.   1. Which medications need to be refilled? (please list name of each medication and dose if known)   furosemide (LASIX) 40 MG tablet    2. Which pharmacy/location (including street and city if local pharmacy) is medication to be sent to? [facility] [phone] - [place], [address] AT [place]  3. Do they need a 30 day or 90 day supply? 30 days

## 2020-02-08 NOTE — Telephone Encounter (Signed)
Refill sent in per request.  

## 2020-03-03 ENCOUNTER — Ambulatory Visit: Payer: Medicare Other

## 2020-03-10 ENCOUNTER — Ambulatory Visit (INDEPENDENT_AMBULATORY_CARE_PROVIDER_SITE_OTHER): Payer: Medicare Other | Admitting: *Deleted

## 2020-03-10 DIAGNOSIS — T6394XD Toxic effect of contact with unspecified venomous animal, undetermined, subsequent encounter: Secondary | ICD-10-CM

## 2020-03-26 DIAGNOSIS — H40013 Open angle with borderline findings, low risk, bilateral: Secondary | ICD-10-CM | POA: Diagnosis not present

## 2020-04-08 ENCOUNTER — Telehealth: Payer: Self-pay | Admitting: Oncology

## 2020-04-08 NOTE — Telephone Encounter (Signed)
Patient called requesting to be rescheduled from [facility] to Dr Bobby Rumpf on 2/9  Appt rescheduled to 2/9 Labs 9:45 am - Follow Up 10:15 am

## 2020-04-09 ENCOUNTER — Other Ambulatory Visit: Payer: Self-pay

## 2020-04-09 ENCOUNTER — Other Ambulatory Visit: Payer: Self-pay | Admitting: Oncology

## 2020-04-09 ENCOUNTER — Encounter: Payer: Self-pay | Admitting: Hematology and Oncology

## 2020-04-09 ENCOUNTER — Inpatient Hospital Stay (INDEPENDENT_AMBULATORY_CARE_PROVIDER_SITE_OTHER): Payer: Medicare Other | Admitting: Oncology

## 2020-04-09 ENCOUNTER — Other Ambulatory Visit: Payer: Medicare Other

## 2020-04-09 ENCOUNTER — Inpatient Hospital Stay: Payer: Medicare Other | Admitting: Oncology

## 2020-04-09 ENCOUNTER — Ambulatory Visit: Payer: Medicare Other | Admitting: Hematology and Oncology

## 2020-04-09 ENCOUNTER — Inpatient Hospital Stay: Payer: Medicare Other

## 2020-04-09 ENCOUNTER — Telehealth: Payer: Self-pay | Admitting: Oncology

## 2020-04-09 ENCOUNTER — Inpatient Hospital Stay: Payer: Medicare Other | Attending: Hematology and Oncology

## 2020-04-09 VITALS — BP 142/82 | HR 107 | Temp 97.7°F | Resp 16 | Ht 61.0 in | Wt 172.5 lb

## 2020-04-09 DIAGNOSIS — C8338 Diffuse large B-cell lymphoma, lymph nodes of multiple sites: Secondary | ICD-10-CM

## 2020-04-09 DIAGNOSIS — D649 Anemia, unspecified: Secondary | ICD-10-CM | POA: Diagnosis not present

## 2020-04-09 DIAGNOSIS — C801 Malignant (primary) neoplasm, unspecified: Secondary | ICD-10-CM | POA: Diagnosis not present

## 2020-04-09 DIAGNOSIS — C8330 Diffuse large B-cell lymphoma, unspecified site: Secondary | ICD-10-CM

## 2020-04-09 HISTORY — DX: Diffuse large B-cell lymphoma, unspecified site: C83.30

## 2020-04-09 MED ORDER — PREDNISONE 50 MG PO TABS: ORAL_TABLET | ORAL | 0 refills | Status: DC

## 2020-04-09 NOTE — Progress Notes (Signed)
Alexis Stephens  [address] [place],  [place]  [address] [phone]  Clinic Day:  04/09/2020  Referring physician: [name], MD   HISTORY OF PRESENT ILLNESS:  The patient is a 85 y.o. female with  stage IVA diffuse large B-cell lymphoma, which included pulmonary, subcutaneous, osseous and muscular involvement.  Her PET scan showed a complete response after she completed her 6 cycles of R-CHOP chemotherapy in April 2018.  The patient comes in today for routine follow-up.  Since her last visit, the patient has been doing okay.  With respect to her history of diffuse large B-cell lymphoma, the patient continues to deny having any B symptoms or peripheral lymphadenopathy which concerns her for overt signs of disease recurrence. However, her left abdomen remains uncomfortable. She feels there is a fullness in this region, which concerns her as this was where a major site of her lymphoma initially was.    PHYSICAL EXAM:  Blood pressure (!) 142/82, pulse (!) 107, temperature 97.7 F (36.5 C), resp. rate 16, height 5\' 1"  (1.549 m), weight 172 lb 8 oz (78.2 kg), SpO2 97 %. Wt Readings from Last 3 Encounters:  04/09/20 172 lb 8 oz (78.2 kg)  01/18/20 173 lb (78.5 kg)  10/19/19 166 lb 9.6 oz (75.6 kg)   Body mass index is 32.59 kg/m. Performance status (ECOG): 1 - Symptomatic but completely ambulatory Physical Exam Constitutional:      Appearance: Normal appearance. She is not ill-appearing.  HENT:     Mouth/Throat:     Mouth: Mucous membranes are moist.     Pharynx: Oropharynx is clear. No oropharyngeal exudate or posterior oropharyngeal erythema.  Cardiovascular:     Rate and Rhythm: Normal rate and regular rhythm.     Heart sounds: No murmur heard. No friction rub. No gallop.   Pulmonary:     Effort: Pulmonary effort is normal. No respiratory distress.     Breath sounds: Normal breath sounds. No wheezing, rhonchi or rales.  Chest:  Breasts:      Right: No axillary adenopathy or supraclavicular adenopathy.     Left: No axillary adenopathy or supraclavicular adenopathy.    Abdominal:     General: Bowel sounds are normal. There is no distension.     Palpations: Abdomen is soft. There is no mass.     Tenderness: There is no abdominal tenderness.  Musculoskeletal:        General: No swelling.     Right lower leg: No edema.     Left lower leg: No edema.  Lymphadenopathy:     Cervical: No cervical adenopathy.     Upper Body:     Right upper body: No supraclavicular or axillary adenopathy.     Left upper body: No supraclavicular or axillary adenopathy.     Lower Body: No right inguinal adenopathy. No left inguinal adenopathy.  Skin:    General: Skin is warm.     Coloration: Skin is not jaundiced.     Findings: No lesion or rash.  Neurological:     General: No focal deficit present.     Mental Status: She is alert and oriented to person, place, and time. Mental status is at baseline.     Cranial Nerves: Cranial nerves are intact.  Psychiatric:        Mood and Affect: Mood normal.        Behavior: Behavior normal.        Thought Content: Thought content  normal.     LABS:      ASSESSMENT & PLAN:  Assessment/Plan:  An 85 y.o. female with stage IVA diffuse large B-cell lymphoma, who completed 6 cycles of R-CHOP chemotherapy in April 2018.  Based upon her labs and physical exam today, it appears the patient remains disease-free.  However, as the patient continues to complain of left-sided abdominopelvic discomfort, I will arrange for her to undergo CT scans of her abdomen/pelvis to ascertain her new disease baseline.  This will be done in 2 weeks.  I will see her back the following day to go over her scans and their implications.  The patient understands all the plans discussed today and is in agreement with them.    Marquitta Persichetti Macarthur Critchley, MD

## 2020-04-09 NOTE — Telephone Encounter (Signed)
Per 2/9 los next appt sched and given to patient.Ct scans also sched.

## 2020-04-21 ENCOUNTER — Telehealth: Payer: Self-pay | Admitting: Oncology

## 2020-04-21 NOTE — Telephone Encounter (Signed)
Per Olegario Shearer, pharmacist instructed her that a new order needed to be written without Contrast for 2/22 CT Scans. They stated patient is allergic to IV Contrast and may have Cardiac Event is she is given the medication  Per Dr Bobby Rumpf, he prescribed patient at last visit with Premedication.  Patient affirmed she has medication/instructions  Left Imaging with Contrast  Notified Vicky in the CT Room

## 2020-04-22 DIAGNOSIS — S32302A Unspecified fracture of left ilium, initial encounter for closed fracture: Secondary | ICD-10-CM | POA: Diagnosis not present

## 2020-04-22 DIAGNOSIS — K449 Diaphragmatic hernia without obstruction or gangrene: Secondary | ICD-10-CM | POA: Diagnosis not present

## 2020-04-22 DIAGNOSIS — C8590 Non-Hodgkin lymphoma, unspecified, unspecified site: Secondary | ICD-10-CM | POA: Diagnosis not present

## 2020-04-22 DIAGNOSIS — K5750 Diverticulosis of both small and large intestine without perforation or abscess without bleeding: Secondary | ICD-10-CM | POA: Diagnosis not present

## 2020-04-22 DIAGNOSIS — C8335 Diffuse large B-cell lymphoma, lymph nodes of inguinal region and lower limb: Secondary | ICD-10-CM | POA: Diagnosis not present

## 2020-04-22 DIAGNOSIS — I2510 Atherosclerotic heart disease of native coronary artery without angina pectoris: Secondary | ICD-10-CM | POA: Diagnosis not present

## 2020-04-22 DIAGNOSIS — I700 Atherosclerosis of aorta: Secondary | ICD-10-CM | POA: Diagnosis not present

## 2020-04-22 NOTE — Progress Notes (Signed)
[facility]  [address] [place],  [place]  [address] [phone]  Clinic Day:  04/23/2020  Referring physician: [name], MD   HISTORY OF PRESENT ILLNESS:  The patient is a 85 y.o. female with  stage IVA diffuse large B-cell lymphoma, which included pulmonary, subcutaneous, osseous and muscular involvement.  Her PET scan showed a complete response after she completed her 6 cycles of R-CHOP chemotherapy in April 2018.  The patient comes in today to go over CT scans, which were done due to her left-sided abdominal discomfort.  This pain is in the same region where a bulky component of her lymphoma was initially found.  Past labs and exams have not shown any obvious hint of disease recurrence.   PHYSICAL EXAM:  Blood pressure 124/64, pulse 99, temperature 97.7 F (36.5 C), resp. rate 16, height 5\' 1"  (1.549 m), weight 175 lb 1.6 oz (79.4 kg), SpO2 96 %. Wt Readings from Last 3 Encounters:  04/23/20 175 lb 1.6 oz (79.4 kg)  04/09/20 172 lb 8 oz (78.2 kg)  01/18/20 173 lb (78.5 kg)   Body mass index is 33.08 kg/m. Performance status (ECOG): 1 - Symptomatic but completely ambulatory Physical Exam Constitutional:      Appearance: Normal appearance. She is not ill-appearing.  HENT:     Mouth/Throat:     Mouth: Mucous membranes are moist.     Pharynx: Oropharynx is clear. No oropharyngeal exudate or posterior oropharyngeal erythema.  Cardiovascular:     Rate and Rhythm: Normal rate and regular rhythm.     Heart sounds: No murmur heard. No friction rub. No gallop.   Pulmonary:     Effort: Pulmonary effort is normal. No respiratory distress.     Breath sounds: Normal breath sounds. No wheezing, rhonchi or rales.  Chest:  Breasts:     Right: No axillary adenopathy or supraclavicular adenopathy.     Left: No axillary adenopathy or supraclavicular adenopathy.    Abdominal:     General: Bowel sounds are normal. There is no distension.      Palpations: Abdomen is soft. There is no mass.     Tenderness: There is no abdominal tenderness.  Musculoskeletal:        General: No swelling.     Right lower leg: No edema.     Left lower leg: No edema.  Lymphadenopathy:     Cervical: No cervical adenopathy.     Upper Body:     Right upper body: No supraclavicular or axillary adenopathy.     Left upper body: No supraclavicular or axillary adenopathy.     Lower Body: No right inguinal adenopathy. No left inguinal adenopathy.  Skin:    General: Skin is warm.     Coloration: Skin is not jaundiced.     Findings: No lesion or rash.  Neurological:     General: No focal deficit present.     Mental Status: She is alert and oriented to person, place, and time. Mental status is at baseline.     Cranial Nerves: Cranial nerves are intact.  Psychiatric:        Mood and Affect: Mood normal.        Behavior: Behavior normal.        Thought Content: Thought content normal.    SCANS:  CT scans of her abdomen/pelvis revealed the following: FINDINGS: CT CHEST FINDINGS  Cardiovascular: Right chest port catheter. Aortic atherosclerosis. Normal heart size. Left and right coronary  artery calcifications. No pericardial effusion.  Mediastinum/Nodes: No enlarged mediastinal, hilar, or axillary lymph nodes. Large hiatal hernia containing the gastric body and fundus as well as portions of the pancreas and transverse colon. Thyroid gland, trachea, and esophagus demonstrate no significant findings.  Lungs/Pleura: Mild predominantly dependent bibasilar scarring and or atelectasis. No pleural effusion or pneumothorax.  Musculoskeletal: No chest wall mass or suspicious bone lesions identified.  CT ABDOMEN PELVIS FINDINGS  Hepatobiliary: No solid liver abnormality is seen. Stable, benign liver cysts. Status post cholecystectomy. No biliary dilatation.  Pancreas: Unremarkable. No pancreatic ductal dilatation or surrounding inflammatory  changes.  Spleen: Normal in size without significant abnormality.  Adrenals/Urinary Tract: Adrenal glands are unremarkable. Kidneys are normal, without renal calculi, solid lesion, or hydronephrosis. Bladder is unremarkable.  Stomach/Bowel: Stomach is within normal limits. Appendix appears normal. No evidence of bowel wall thickening, distention, or inflammatory changes. Descending and sigmoid diverticulosis.  Vascular/Lymphatic: Aortic atherosclerosis. No enlarged abdominal or pelvic lymph nodes.  Reproductive: Status post hysterectomy.  Other: No abdominal wall hernia or abnormality. No abdominopelvic ascites.  Musculoskeletal: No acute or significant osseous findings. 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  IMPRESSION: 1. No lymphadenopathy in the chest, abdomen, or pelvis. 2. Normal spleen. 3. Large hiatal hernia containing the gastric body and fundus as well as portions of the pancreas and transverse colon. 4. Coronary artery disease.  Aortic Atherosclerosis (ICD10-I70.0).  ASSESSMENT & PLAN:  Assessment/Plan:  An 85 y.o. female with stage IVA diffuse large B-cell lymphoma, who completed 6 cycles of R-CHOP chemotherapy in April 2018.  In clinic today, I went over all of her CT scan images with her, for which she could appreciate that there is no evidence of recurrent lymphoma.  Understandably, she was pleased with the results.  Clinically, she is doing well.  I will get back to following her every 6 months with labs and physical exams to ensure there remains no evidence of late disease recurrence.  The patient understands all the plans discussed today and is in agreement with them.    Osa Fogarty Macarthur Critchley, MD

## 2020-04-23 ENCOUNTER — Inpatient Hospital Stay (INDEPENDENT_AMBULATORY_CARE_PROVIDER_SITE_OTHER): Payer: Medicare Other | Admitting: Oncology

## 2020-04-23 ENCOUNTER — Other Ambulatory Visit: Payer: Self-pay

## 2020-04-23 ENCOUNTER — Telehealth: Payer: Self-pay | Admitting: Oncology

## 2020-04-23 ENCOUNTER — Other Ambulatory Visit: Payer: Self-pay | Admitting: Oncology

## 2020-04-23 VITALS — BP 124/64 | HR 99 | Temp 97.7°F | Resp 16 | Ht 61.0 in | Wt 175.1 lb

## 2020-04-23 DIAGNOSIS — C8338 Diffuse large B-cell lymphoma, lymph nodes of multiple sites: Secondary | ICD-10-CM

## 2020-04-23 NOTE — Telephone Encounter (Signed)
Per 2/23 los next appt sched and given to patient 

## 2020-04-28 ENCOUNTER — Encounter: Payer: Self-pay | Admitting: Cardiology

## 2020-04-28 ENCOUNTER — Other Ambulatory Visit: Payer: Self-pay

## 2020-04-28 ENCOUNTER — Ambulatory Visit (INDEPENDENT_AMBULATORY_CARE_PROVIDER_SITE_OTHER): Payer: Medicare Other | Admitting: Cardiology

## 2020-04-28 VITALS — BP 112/78 | HR 94 | Ht 61.0 in | Wt 175.0 lb

## 2020-04-28 DIAGNOSIS — R0600 Dyspnea, unspecified: Secondary | ICD-10-CM | POA: Diagnosis not present

## 2020-04-28 DIAGNOSIS — C8338 Diffuse large B-cell lymphoma, lymph nodes of multiple sites: Secondary | ICD-10-CM

## 2020-04-28 DIAGNOSIS — I5020 Unspecified systolic (congestive) heart failure: Secondary | ICD-10-CM | POA: Diagnosis not present

## 2020-04-28 DIAGNOSIS — R0609 Other forms of dyspnea: Secondary | ICD-10-CM

## 2020-04-28 DIAGNOSIS — M25552 Pain in left hip: Secondary | ICD-10-CM

## 2020-04-28 DIAGNOSIS — I428 Other cardiomyopathies: Secondary | ICD-10-CM

## 2020-04-28 DIAGNOSIS — E785 Hyperlipidemia, unspecified: Secondary | ICD-10-CM | POA: Diagnosis not present

## 2020-04-28 DIAGNOSIS — G8929 Other chronic pain: Secondary | ICD-10-CM

## 2020-04-28 MED ORDER — CYCLOBENZAPRINE HCL 10 MG PO TABS: 10.0000 mg | ORAL_TABLET | Freq: Three times a day (TID) | ORAL | 1 refills | Status: DC | PRN

## 2020-04-28 NOTE — Patient Instructions (Signed)
Medication Instructions:  Your physician recommends that you continue on your current medications as directed. Please refer to the Current Medication list given to you today.  *If you need a refill on your cardiac medications before your next appointment, please call your pharmacy*   Lab Work: Your physician recommends that you return for lab work today: bmp, pro bnp   If you have labs (blood work) drawn today and your tests are completely normal, you will receive your results only by: Marland Kitchen MyChart Message (if you have MyChart) OR . A paper copy in the mail If you have any lab test that is abnormal or we need to change your treatment, we will call you to review the results.   Testing/Procedures: Your physician has requested that you have an echocardiogram. Echocardiography is a painless test that uses sound waves to create images of your heart. It provides your doctor with information about the size and shape of your heart and how well your heart's chambers and valves are working. This procedure takes approximately one hour. There are no restrictions for this procedure.     Follow-Up: At [facility], you and your health needs are our priority.  As part of our continuing mission to provide you with exceptional heart care, we have created designated Provider Care Teams.  These Care Teams include your primary Cardiologist (physician) and Advanced Practice Providers (APPs -  Physician Assistants and Nurse Practitioners) who all work together to provide you with the care you need, when you need it.  We recommend signing up for the patient portal called "MyChart".  Sign up information is provided on this After Visit Summary.  MyChart is used to connect with patients for Virtual Visits (Telemedicine).  Patients are able to view lab/test results, encounter notes, upcoming appointments, etc.  Non-urgent messages can be sent to your provider as well.   To learn more about what you can do with MyChart,  go to [email or web address].    Your next appointment:   2 month(s)  The format for your next appointment:   In Person   Provider:   [name], MD   Other Instructions   Echocardiogram An echocardiogram is a test that uses sound waves (ultrasound) to produce images of the heart. Images from an echocardiogram can provide important information about:  Heart size and shape.  The size and thickness and movement of your heart's walls.  Heart muscle function and strength.  Heart valve function or if you have stenosis. Stenosis is when the heart valves are too narrow.  If blood is flowing backward through the heart valves (regurgitation).  A tumor or infectious growth around the heart valves.  Areas of heart muscle that are not working well because of poor blood flow or injury from a heart attack.  Aneurysm detection. An aneurysm is a weak or damaged part of an artery wall. The wall bulges out from the normal force of blood pumping through the body. Tell a health care provider about:  Any allergies you have.  All medicines you are taking, including vitamins, herbs, eye drops, creams, and over-the-counter medicines.  Any blood disorders you have.  Any surgeries you have had.  Any medical conditions you have.  Whether you are pregnant or may be pregnant. What are the risks? Generally, this is a safe test. However, problems may occur, including an allergic reaction to dye (contrast) that may be used during the test. What happens before the test? No specific preparation is needed. You may  eat and drink normally. What happens during the test?  You will take off your clothes from the waist up and put on a hospital gown.  Electrodes or electrocardiogram (ECG)patches may be placed on your chest. The electrodes or patches are then connected to a device that monitors your heart rate and rhythm.  You will lie down on a table for an ultrasound exam. A gel will be  applied to your chest to help sound waves pass through your skin.  A handheld device, called a transducer, will be pressed against your chest and moved over your heart. The transducer produces sound waves that travel to your heart and bounce back (or "echo" back) to the transducer. These sound waves will be captured in real-time and changed into images of your heart that can be viewed on a video monitor. The images will be recorded on a computer and reviewed by your health care provider.  You may be asked to change positions or hold your breath for a short time. This makes it easier to get different views or better views of your heart.  In some cases, you may receive contrast through an IV in one of your veins. This can improve the quality of the pictures from your heart. The procedure may vary among health care providers and hospitals.   What can I expect after the test? You may return to your normal, everyday life, including diet, activities, and medicines, unless your health care provider tells you not to do that. Follow these instructions at home:  It is up to you to get the results of your test. Ask your health care provider, or the department that is doing the test, when your results will be ready.  Keep all follow-up visits. This is important. Summary  An echocardiogram is a test that uses sound waves (ultrasound) to produce images of the heart.  Images from an echocardiogram can provide important information about the size and shape of your heart, heart muscle function, heart valve function, and other possible heart problems.  You do not need to do anything to prepare before this test. You may eat and drink normally.  After the echocardiogram is completed, you may return to your normal, everyday life, unless your health care provider tells you not to do that. This information is not intended to replace advice given to you by your health care provider. Make sure you discuss any  questions you have with your health care provider. Document Revised: 10/09/2019 Document Reviewed: 10/09/2019 Elsevier Patient Education  2021 [employer].

## 2020-04-28 NOTE — Progress Notes (Signed)
Cardiology Office Note:    Date:  04/28/2020   ID:  Alexis Stephens, DOB May 13, 1935, MRN [id number]  PCP:  [name], MD  Cardiologist:  [name], MD    Referring MD: [name], MD   Chief Complaint  Patient presents with  . gained weight  . Shortness of Breath  . burning sensation on bilateral feet    History of Present Illness:    Alexis Stephens is a 85 y.o. female with past medical history significant for cardiomyopathy initially ejection fraction 20 to 25%, ischemia work-up negative, she was put on appropriate medication which include beta-blocker as well as Entresto and improve her left ventricle ejection fraction to about 35. However, she was unable to afford Entresto and she is on losartan. 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 Now she decided to retire completely. She comes today to my office complaining of being tired exhausted as well as some shortness of breath and swelling of lower extremities that she noted. That being going progressively getting worse for last few weeks. There is no proximal nocturnal dyspnea but she did notice the redness of her feet in the middle of the night with some pain in her feet. 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 Denies have any chest pain tightness squeezing pressure burning chest.  Past Medical History:  Diagnosis Date  . Anaphylaxis due to hymenoptera venom 10/30/2014  . BMI 35.0-35.9,adult 02/10/2016  . Cancer [facility])    Stage 4 Non-Hodgkin Lymphoma, clear for the past year  . Cataract   . Diffuse large B cell lymphoma ([place]) 04/09/2020  . Dyslipidemia 10/19/2019  . Dyspnea on exertion 10/30/2015  . GERD (gastroesophageal reflux disease)   . HFrEF (heart failure with reduced ejection fraction) ([place]) 11/25/2017  . Hymenoptera allergy   . Left hip pain 02/10/2016  . Lymphadenopathy, inguinal  02/10/2016  . Nonischemic cardiomyopathy ([place]) 11/25/2017  . Overgrown toenails 09/21/2019  . Palpitations 10/30/2015  . Postoperative examination 02/17/2016  . Swelling   . Tachycardia 10/30/2015    Past Surgical History:  Procedure Laterality Date  . ABDOMINAL HYSTERECTOMY    . ADENOIDECTOMY    . APPENDECTOMY    . CHOLECYSTECTOMY    . EYE SURGERY    . NEPHROLITHOTOMY Left   . TONSILLECTOMY      Current Medications: Current Meds  Medication Sig  . carvedilol (COREG) 6.25 MG tablet TAKE 1 TABLET(6.25 MG) BY MOUTH TWICE DAILY  . cyclobenzaprine (FLEXERIL) 10 MG tablet Take 10 mg by mouth as needed.  Marland Kitchen EPINEPHrine 0.3 mg/0.3 mL IJ SOAJ injection Inject 0.3 mg into the muscle once.  . furosemide (LASIX) 40 MG tablet Take 1.5 tablets (60 mg total) by mouth daily.  Marland Kitchen losartan (COZAAR) 50 MG tablet Take 1 tablet (50 mg total) by mouth in the morning and at bedtime.  . Multiple Vitamin (MULTIVITAMIN) capsule Take by mouth.  . Potassium Chloride ER 20 MEQ TBCR Take 3 tablets by mouth daily.  . pravastatin (PRAVACHOL) 40 MG tablet Take 1 tablet (40 mg total) by mouth every evening.  . predniSONE (DELTASONE) 50 MG tablet Take 1 pill at 13 hours, 7 hours and 1 hour before CT scan  . [DISCONTINUED] Acetaminophen (TYLENOL PO) Take by mouth as needed.     Allergies:   Ivp dye [iodinated diagnostic agents], Mixed vespid venom, Wasp  venom, Bee venom, Furadantin [nitrofurantoin], Ibuprofen, Iodine, Iodine-131, Magnesium citrate, Penicillamine, Penicillins, Sulfa antibiotics, and Sulfasalazine   Social History   Socioeconomic History  . Marital status: Widowed    Spouse name: Not on file  . Number of children: Not on file  . Years of education: Not on file  . Highest education level: Not on file  Occupational History  . Not on file  Tobacco Use  . Smoking status: Former Smoker    Quit date: 08/24/1993    Years since quitting: 26.6  . Smokeless tobacco: Never Used  Substance and Sexual  Activity  . Alcohol use: Yes    Comment: occasional  . Drug use: Never  . Sexual activity: Not on file  Other Topics Concern  . Not on file  Social History Narrative  . Not on file   Social Determinants of Health   Financial Resource Strain: Not on file  Food Insecurity: Not on file  Transportation Needs: Not on file  Physical Activity: Not on file  Stress: Not on file  Social Connections: Not on file     Family History: The patient's family history includes Cancer in her brother; Osteoarthritis in her mother; Other in her father; Rheum arthritis in her mother. ROS:   Please see the history of present illness.    All 14 point review of systems negative except as described per history of present illness  EKGs/Labs/Other Studies Reviewed:      Recent Labs: 07/12/2019: Hemoglobin 13.7; Platelets 235 08/23/2019: NT-Pro BNP 5,922 01/18/2020: BUN 20; Creatinine, Ser 1.28; Potassium 5.1; Sodium 141  Recent Lipid Panel    Component Value Date/Time   CHOL 215 (H) 01/18/2020 0903   TRIG 137 01/18/2020 0903   HDL 44 01/18/2020 0903   CHOLHDL 4.9 (H) 01/18/2020 0903   LDLCALC 146 (H) 01/18/2020 0903    Physical Exam:    VS:  BP 112/78 (BP Location: Left Arm, Patient Position: Sitting)   Pulse 94   Ht 5\' 1"  (1.549 m)   Wt 175 lb (79.4 kg)   SpO2 94%   BMI 33.07 kg/m     Wt Readings from Last 3 Encounters:  04/28/20 175 lb (79.4 kg)  04/23/20 175 lb 1.6 oz (79.4 kg)  04/09/20 172 lb 8 oz (78.2 kg)     GEN:  Well nourished, well developed in no acute distress HEENT: Normal NECK: No JVD; No carotid bruits LYMPHATICS: No lymphadenopathy CARDIAC: RRR, no murmurs, no rubs, no gallops RESPIRATORY: Few crackles both bases ABDOMEN: Soft, non-tender, non-distended MUSCULOSKELETAL:  No edema; No deformity  SKIN: Warm and dry LOWER EXTREMITIES: no swelling NEUROLOGIC:  Alert and oriented x 3 PSYCHIATRIC:  Normal affect   ASSESSMENT:    1. Nonischemic cardiomyopathy  (HCC)   2. HFrEF (heart failure with reduced ejection fraction) ([place])   3. Diffuse large B-cell lymphoma of lymph nodes of multiple regions ([place])   4. Dyslipidemia    PLAN:    In order of problems listed above:  1. History of cardiomyopathy which is nonischemic. Ejection fraction close to 35%. I will ask him to have repeated echocardiogram done, today we will do Chem-7 as well as proBNP based on that we decide about augmentation of her therapy. She also will be referred to EP team for consideration of ICD implantation. 2. Heart failure with reduced left ventricle ejection fraction plan as outlined above. 3. Diffuse large B-cell lymphoma I did review oncology notes luckily she had recently CT of her abdomen done  which showed no reactivation of the problem. 4. Dyslipidemia: She is not take any cholesterol medication because she does not want to take any. However her cholesterol is one six unacceptably high with LDL of 146 based on K PN. We will continue discussion about this issue. 5. She did have a DVT study done in the summertime which were negative.   Medication Adjustments/Labs and Tests Ordered: Current medicines are reviewed at length with the patient today.  Concerns regarding medicines are outlined above.  No orders of the defined types were placed in this encounter.  Medication changes: No orders of the defined types were placed in this encounter.   Signed, Park Liter, MD, [facility] 04/28/2020 11:40 AM    [place]

## 2020-04-29 DIAGNOSIS — L602 Onychogryphosis: Secondary | ICD-10-CM | POA: Diagnosis not present

## 2020-04-29 LAB — BASIC METABOLIC PANEL
BUN/Creatinine Ratio: 16 (ref 12–28)
BUN: 18 mg/dL (ref 8–27)
CO2: 20 mmol/L (ref 20–29)
Calcium: 9.5 mg/dL (ref 8.7–10.3)
Chloride: 100 mmol/L (ref 96–106)
Creatinine, Ser: 1.11 mg/dL — ABNORMAL HIGH (ref 0.57–1.00)
Glucose: 79 mg/dL (ref 65–99)
Potassium: 4.8 mmol/L (ref 3.5–5.2)
Sodium: 142 mmol/L (ref 134–144)
eGFR: 50 mL/min/{1.73_m2} — ABNORMAL LOW (ref >59)

## 2020-04-29 LAB — PRO B NATRIURETIC PEPTIDE: NT-Pro BNP: 9752 pg/mL — ABNORMAL HIGH (ref 0–738)

## 2020-04-30 ENCOUNTER — Telehealth: Payer: Self-pay | Admitting: Emergency Medicine

## 2020-04-30 MED ORDER — FUROSEMIDE 40 MG PO TABS: ORAL_TABLET | ORAL | 2 refills | Status: DC

## 2020-04-30 NOTE — Telephone Encounter (Signed)
Patient has been informed of results and recommendation. Asked Dr. Agustin Cree about her having repeat labs turns out he mistakenly put wrong dose. He wants her to really take lasix 80 mg in the morning and 40 mg in the evening for 3 days while taking potassium 80 meq daily then after 3 days she can go to lasix 80 mg daily and only 60 meq daily of potassium and recheck labs on Monday. Trying to call patient back and inform her of new recommendations. Phone is busy x2    Spoke to patient. Informed her to take lasix 80 mg in the morning and 40 mg in the evening for 3 days and increase potassium to 80 meq daily during those three days. After 3 days she will start taking lasix 80 mg daily and go back to potassium 60 meq daily. She will repeat labs on Monday. No further questions.   Per Dr. Agustin Cree no need call her on Friday as she will be in office on Monday. Patient will call if she needs anything further.

## 2020-04-30 NOTE — Telephone Encounter (Signed)
-----   Message from Park Liter, MD sent at 04/30/2020 12:08 PM EST ----- Clearly have evidence of congestive heart failure.  Please increase dose of diuretic she takes 60 mg right now please increase to 80 mg daily for him in the morning 40 in the afternoon same dose of potassium lets call her on Friday and see how she does

## 2020-05-02 ENCOUNTER — Encounter: Payer: Self-pay | Admitting: Oncology

## 2020-05-05 ENCOUNTER — Ambulatory Visit (INDEPENDENT_AMBULATORY_CARE_PROVIDER_SITE_OTHER): Payer: Medicare Other | Admitting: *Deleted

## 2020-05-05 ENCOUNTER — Other Ambulatory Visit: Payer: Self-pay

## 2020-05-05 DIAGNOSIS — T6394XD Toxic effect of contact with unspecified venomous animal, undetermined, subsequent encounter: Secondary | ICD-10-CM

## 2020-05-05 DIAGNOSIS — R609 Edema, unspecified: Secondary | ICD-10-CM | POA: Diagnosis not present

## 2020-05-05 DIAGNOSIS — R0602 Shortness of breath: Secondary | ICD-10-CM | POA: Diagnosis not present

## 2020-05-06 LAB — BASIC METABOLIC PANEL
BUN/Creatinine Ratio: 19 (ref 12–28)
BUN: 54 mg/dL — ABNORMAL HIGH (ref 8–27)
CO2: 19 mmol/L — ABNORMAL LOW (ref 20–29)
Calcium: 8.7 mg/dL (ref 8.7–10.3)
Chloride: 95 mmol/L — ABNORMAL LOW (ref 96–106)
Creatinine, Ser: 2.83 mg/dL — ABNORMAL HIGH (ref 0.57–1.00)
Glucose: 85 mg/dL (ref 65–99)
Potassium: 5.1 mmol/L (ref 3.5–5.2)
Sodium: 137 mmol/L (ref 134–144)
eGFR: 16 mL/min/{1.73_m2} — ABNORMAL LOW (ref >59)

## 2020-05-06 LAB — PRO B NATRIURETIC PEPTIDE: NT-Pro BNP: 3442 pg/mL — ABNORMAL HIGH (ref 0–738)

## 2020-05-07 ENCOUNTER — Telehealth: Payer: Self-pay | Admitting: Emergency Medicine

## 2020-05-07 DIAGNOSIS — Z79899 Other long term (current) drug therapy: Secondary | ICD-10-CM

## 2020-05-07 NOTE — Telephone Encounter (Signed)
Called patient informed her of results. After discussing further with Dr. Agustin Cree patient will decrease lasix to 60 mg daily and potassium to 60 meq daily she will repeat labs tomorrow. Patient verbally understood no further questions.

## 2020-05-07 NOTE — Telephone Encounter (Signed)
-----   Message from Park Liter, MD sent at 05/07/2020  1:03 PM EST ----- Markers for congestive heart failure dramatically improved from almost [phone], however the problem is kidney dysfunction, please cut down furosemide -Potassium and a half.

## 2020-05-08 DIAGNOSIS — Z79899 Other long term (current) drug therapy: Secondary | ICD-10-CM | POA: Diagnosis not present

## 2020-05-08 LAB — BASIC METABOLIC PANEL
BUN/Creatinine Ratio: 23 (ref 12–28)
BUN: 35 mg/dL — ABNORMAL HIGH (ref 8–27)
CO2: 18 mmol/L — ABNORMAL LOW (ref 20–29)
Calcium: 9.8 mg/dL (ref 8.7–10.3)
Chloride: 104 mmol/L (ref 96–106)
Creatinine, Ser: 1.53 mg/dL — ABNORMAL HIGH (ref 0.57–1.00)
Glucose: 84 mg/dL (ref 65–99)
Potassium: 5 mmol/L (ref 3.5–5.2)
Sodium: 140 mmol/L (ref 134–144)
eGFR: 34 mL/min/{1.73_m2} — ABNORMAL LOW (ref >59)

## 2020-05-28 ENCOUNTER — Ambulatory Visit (INDEPENDENT_AMBULATORY_CARE_PROVIDER_SITE_OTHER): Payer: Medicare Other

## 2020-05-28 ENCOUNTER — Other Ambulatory Visit: Payer: Self-pay

## 2020-05-28 DIAGNOSIS — R0600 Dyspnea, unspecified: Secondary | ICD-10-CM | POA: Diagnosis not present

## 2020-05-28 DIAGNOSIS — R0609 Other forms of dyspnea: Secondary | ICD-10-CM

## 2020-05-28 DIAGNOSIS — I5020 Unspecified systolic (congestive) heart failure: Secondary | ICD-10-CM | POA: Diagnosis not present

## 2020-05-28 DIAGNOSIS — I428 Other cardiomyopathies: Secondary | ICD-10-CM

## 2020-05-28 LAB — ECHOCARDIOGRAM COMPLETE
AR max vel: 0.99 cm2
AV Area VTI: 1.02 cm2
AV Area mean vel: 0.96 cm2
AV Mean grad: 8 mmHg
AV Peak grad: 12.7 mmHg
Ao pk vel: 1.78 m/s
Area-P 1/2: 5.27 cm2
Calc EF: 21.4 %
MV M vel: 5 m/s
MV Peak grad: 100 mmHg
P 1/2 time: 290 msec
Radius: 0.8 cm
S' Lateral: 5.3 cm
Single Plane A2C EF: 17.3 %
Single Plane A4C EF: 26.1 %

## 2020-05-28 NOTE — Progress Notes (Signed)
Complete echocardiogram performed.  Jimmy Arlinda Barcelona RDCS, RVT  

## 2020-06-16 ENCOUNTER — Encounter: Payer: Self-pay | Admitting: Cardiology

## 2020-06-16 ENCOUNTER — Other Ambulatory Visit: Payer: Self-pay

## 2020-06-16 ENCOUNTER — Ambulatory Visit (INDEPENDENT_AMBULATORY_CARE_PROVIDER_SITE_OTHER): Payer: Medicare Other | Admitting: Cardiology

## 2020-06-16 VITALS — BP 136/68 | HR 109 | Ht 61.0 in | Wt 176.0 lb

## 2020-06-16 DIAGNOSIS — I5020 Unspecified systolic (congestive) heart failure: Secondary | ICD-10-CM | POA: Diagnosis not present

## 2020-06-16 DIAGNOSIS — I428 Other cardiomyopathies: Secondary | ICD-10-CM

## 2020-06-16 DIAGNOSIS — E785 Hyperlipidemia, unspecified: Secondary | ICD-10-CM | POA: Diagnosis not present

## 2020-06-16 DIAGNOSIS — R002 Palpitations: Secondary | ICD-10-CM

## 2020-06-16 DIAGNOSIS — C8338 Diffuse large B-cell lymphoma, lymph nodes of multiple sites: Secondary | ICD-10-CM | POA: Diagnosis not present

## 2020-06-16 MED ORDER — FUROSEMIDE 40 MG PO TABS: ORAL_TABLET | ORAL | 2 refills | Status: DC

## 2020-06-16 NOTE — Addendum Note (Signed)
Addended by: Senaida Ores on: 06/16/2020 03:11 PM   Modules accepted: Orders

## 2020-06-16 NOTE — Progress Notes (Signed)
Cardiology Office Note:    Date:  06/16/2020   ID:  Alexis Stephens, DOB [birth year], MRN [id number]  PCP:  [name], MD  Cardiologist:  [name], MD    Referring MD: [name], MD   Chief Complaint  Patient presents with  . feet burning and discoloration  . Shortness of Breath  . Weight Gain    History of Present Illness:    Alexis Stephens is a 85 y.o. female with past medical history significant for what appears to be nonischemic cardiomyopathy with initial ejection fraction 2 years ago of 20 to 25%.  She was started on appropriate medication with beta-blocker as well as Entresto and her ejection fraction improved.  To tolerate 35%.  However, she could not afford Entresto and we were forced to switch her to losartan.  She came today to my office for follow-up she said she is doing worse she gained some weight she gets shortness of breath she does have paroxysmal nocturnal dyspnea.  Also describes what appears to be neuropathic pain in her lower extremities. She also noticed some shortness of breath with exertion even while talking to me.  Past Medical History:  Diagnosis Date  . Anaphylaxis due to hymenoptera venom 10/30/2014  . BMI 35.0-35.9,adult 02/10/2016  . Cancer [facility])    Stage 4 Non-Hodgkin Lymphoma, clear for the past year  . Cataract   . Diffuse large B cell lymphoma ([place]) 04/09/2020  . Dyslipidemia 10/19/2019  . Dyspnea on exertion 10/30/2015  . GERD (gastroesophageal reflux disease)   . HFrEF (heart failure with reduced ejection fraction) ([place]) 11/25/2017  . Hymenoptera allergy   . Left hip pain 02/10/2016  . Lymphadenopathy, inguinal 02/10/2016  . Nonischemic cardiomyopathy ([place]) 11/25/2017  . Overgrown toenails 09/21/2019  . Palpitations 10/30/2015  . Postoperative examination 02/17/2016  . Swelling   . Tachycardia 10/30/2015    Past Surgical History:  Procedure Laterality Date  . ABDOMINAL HYSTERECTOMY    .  ADENOIDECTOMY    . APPENDECTOMY    . CHOLECYSTECTOMY    . EYE SURGERY    . NEPHROLITHOTOMY Left   . TONSILLECTOMY      Current Medications: Current Meds  Medication Sig  . carvedilol (COREG) 6.25 MG tablet TAKE 1 TABLET(6.25 MG) BY MOUTH TWICE DAILY (Patient taking differently: Take 6.25 mg by mouth 2 (two) times daily with a meal. TAKE 1 TABLET(6.25 MG) BY MOUTH TWICE DAILY)  . cyclobenzaprine (FLEXERIL) 10 MG tablet Take 1 tablet (10 mg total) by mouth 3 (three) times daily as needed for muscle spasms.  Marland Kitchen EPINEPHrine 0.3 mg/0.3 mL IJ SOAJ injection Inject 0.3 mg into the muscle as needed for anaphylaxis.  . furosemide (LASIX) 40 MG tablet Take 80 mg (2 tablets) in the morning and 40 mg (1 tablet) in the evening daily for 3 days. After 3 days take only 80 mg (2tablets)  daily. (Patient taking differently: Take 80 mg by mouth daily. Take 80 mg (2 tablets) in the morning and 40 mg (1 tablet) in the evening daily for 3 days. After 3 days take only 80 mg (2tablets)  daily.)  . losartan (COZAAR) 50 MG tablet Take 1 tablet (50 mg total) by mouth in the morning and at bedtime.  . Multiple Vitamin (MULTIVITAMIN) capsule Take 1 capsule by mouth daily. Unknown strength  . potassium chloride (KLOR-CON) 20 MEQ packet Take 60 mEq by mouth daily.  . [DISCONTINUED] Potassium Chloride ER 20 MEQ TBCR Take  3 tablets by mouth daily.  . [DISCONTINUED] pravastatin (PRAVACHOL) 40 MG tablet Take 1 tablet (40 mg total) by mouth every evening.     Allergies:   Ivp dye [iodinated diagnostic agents], Mixed vespid venom, Wasp venom, Bee venom, Furadantin [nitrofurantoin], Ibuprofen, Iodine, Iodine-131, Magnesium citrate, Penicillamine, Penicillins, Sulfa antibiotics, and Sulfasalazine   Social History   Socioeconomic History  . Marital status: Widowed    Spouse name: Not on file  . Number of children: Not on file  . Years of education: Not on file  . Highest education level: Not on file  Occupational History   . Not on file  Tobacco Use  . Smoking status: Former Smoker    Quit date: 08/24/1993    Years since quitting: 26.8  . Smokeless tobacco: Never Used  Substance and Sexual Activity  . Alcohol use: Yes    Comment: occasional  . Drug use: Never  . Sexual activity: Not on file  Other Topics Concern  . Not on file  Social History Narrative  . Not on file   Social Determinants of Health   Financial Resource Strain: Not on file  Food Insecurity: Not on file  Transportation Needs: Not on file  Physical Activity: Not on file  Stress: Not on file  Social Connections: Not on file     Family History: The patient's family history includes Cancer in her brother; Osteoarthritis in her mother; Other in her father; Rheum arthritis in her mother. ROS:   Please see the history of present illness.    All 14 point review of systems negative except as described per history of present illness  EKGs/Labs/Other Studies Reviewed:      Recent Labs: 07/12/2019: Hemoglobin 13.7; Platelets 235 05/05/2020: NT-Pro BNP 3,442 05/08/2020: BUN 35; Creatinine, Ser 1.53; Potassium 5.0; Sodium 140  Recent Lipid Panel    Component Value Date/Time   CHOL 215 (H) 01/18/2020 0903   TRIG 137 01/18/2020 0903   HDL 44 01/18/2020 0903   CHOLHDL 4.9 (H) 01/18/2020 0903   LDLCALC 146 (H) 01/18/2020 0903    Physical Exam:    VS:  BP 136/68 (BP Location: Right Arm, Patient Position: Sitting)   Pulse (!) 109   Ht 5\' 1"  (1.549 m)   Wt 176 lb (79.8 kg)   SpO2 90%   BMI 33.25 kg/m     Wt Readings from Last 3 Encounters:  06/16/20 176 lb (79.8 kg)  04/28/20 175 lb (79.4 kg)  04/23/20 175 lb 1.6 oz (79.4 kg)     GEN:  Well nourished, well developed in no acute distress HEENT: Normal NECK: No JVD; No carotid bruits LYMPHATICS: No lymphadenopathy CARDIAC: RRR, no murmurs, no rubs, no gallops RESPIRATORY:  Clear to auscultation without rales, wheezing or rhonchi  ABDOMEN: Soft, non-tender,  non-distended MUSCULOSKELETAL:  No edema; No deformity  SKIN: Warm and dry LOWER EXTREMITIES: no swelling, pulses in lower extremities are normal NEUROLOGIC:  Alert and oriented x 3 PSYCHIATRIC:  Normal affect   ASSESSMENT:    1. Nonischemic cardiomyopathy (HCC)   2. HFrEF (heart failure with reduced ejection fraction) (HCC)   3. Palpitations   4. Dyslipidemia   5. Diffuse large B-cell lymphoma of lymph nodes of multiple regions [facility])    PLAN:    In order of problems listed above:  1. Nonischemic cardiomyopathy only on losartan as well as as well as carvedilol which I will continue.  We will try to investigate if there is any way we can help  her to switch to [facility].  She will be referred also to our EP team for consideration of ICD implantation since her ejection fraction is 30 to 35%.  I did talk to her in length about this procedure she understands was involved. 2. Heart failure with reduced left ventricular ejection fraction she is mildly decompensated today.  I will increase dose of diuretics.  She takes 80 mg of furosemide in the morning I will add 40 mg of furosemide at evening time, will also increase dose of potassium.  Chem-7 will be checked today as well as next week. 3. Palpitations denies having any. 4. Dyslipidemia, her LDL is 146 HDL 44 will recheck cholesterol today and start appropriate therapy. 5. Diffuse large B-cell lymphoma followed by entire medicine team.  Latest CT I did review showing the reactivation of the problem. 6. Echocardiogram reviewed there apparently is something in the right ventricle outflow tract that is not clear on the echocardiogram.  She benefit from MRI which I will schedule her to have.   Medication Adjustments/Labs and Tests Ordered: Current medicines are reviewed at length with the patient today.  Concerns regarding medicines are outlined above.  No orders of the defined types were placed in this encounter.  Medication changes: No orders  of the defined types were placed in this encounter.   Signed, Park Liter, MD, [facility] 06/16/2020 2:36 PM    [place]

## 2020-06-16 NOTE — Patient Instructions (Signed)
Medication Instructions:  Your physician has recommended you make the following change in your medication:   INCREASE: Lasix to 80 mg in the morning and 40 mg in the evening daily    INCREASE: Potassium to 60 meq daily  *If you need a refill on your cardiac medications before your next appointment, please call your pharmacy*   Lab Work: Your physician recommends that you return for lab work today: bmp, lipid   Your physician recommends that you return for lab work in 1 week: bmp   If you have labs (blood work) drawn today and your tests are completely normal, you will receive your results only by: Marland Kitchen MyChart Message (if you have MyChart) OR . A paper copy in the mail If you have any lab test that is abnormal or we need to change your treatment, we will call you to review the results.   Testing/Procedures: Your physician has requested that you have a cardiac MRI. Cardiac MRI uses a computer to create images of your heart as its beating, producing both still and moving pictures of your heart and major blood vessels. For further information please visit [email or web address] Please follow the instruction sheet given to you today for more information.     Follow-Up: At [facility], you and your health needs are our priority.  As part of our continuing mission to provide you with exceptional heart care, we have created designated Provider Care Teams.  These Care Teams include your primary Cardiologist (physician) and Advanced Practice Providers (APPs -  Physician Assistants and Nurse Practitioners) who all work together to provide you with the care you need, when you need it.  We recommend signing up for the patient portal called "MyChart".  Sign up information is provided on this After Visit Summary.  MyChart is used to connect with patients for Virtual Visits (Telemedicine).  Patients are able to view lab/test results, encounter notes, upcoming appointments, etc.  Non-urgent messages can  be sent to your provider as well.   To learn more about what you can do with MyChart, go to [email or web address].    Your next appointment:   3 week(s)  The format for your next appointment:   In Person  Provider:   [name], MD   Other Instructions

## 2020-06-17 ENCOUNTER — Telehealth: Payer: Self-pay | Admitting: Cardiology

## 2020-06-17 LAB — BASIC METABOLIC PANEL
BUN/Creatinine Ratio: 13 (ref 12–28)
BUN: 19 mg/dL (ref 8–27)
CO2: 18 mmol/L — ABNORMAL LOW (ref 20–29)
Calcium: 8.9 mg/dL (ref 8.7–10.3)
Chloride: 106 mmol/L (ref 96–106)
Creatinine, Ser: 1.42 mg/dL — ABNORMAL HIGH (ref 0.57–1.00)
Glucose: 82 mg/dL (ref 65–99)
Potassium: 4.8 mmol/L (ref 3.5–5.2)
Sodium: 143 mmol/L (ref 134–144)
eGFR: 37 mL/min/{1.73_m2} — ABNORMAL LOW (ref >59)

## 2020-06-17 LAB — LIPID PANEL
Chol/HDL Ratio: 4.4 ratio (ref 0.0–4.4)
Cholesterol, Total: 195 mg/dL (ref 100–199)
HDL: 44 mg/dL (ref >39)
LDL Chol Calc (NIH): 124 mg/dL — ABNORMAL HIGH (ref 0–99)
Triglycerides: 153 mg/dL — ABNORMAL HIGH (ref 0–149)
VLDL Cholesterol Cal: 27 mg/dL (ref 5–40)

## 2020-06-17 NOTE — Telephone Encounter (Signed)
Pt advised that the appointment schedule is ok. Pt states that she will keep appointments as is. Pt verbalized understanding and had no additional questions.

## 2020-06-17 NOTE — Telephone Encounter (Signed)
   Pt was scheduled with Dr. Curt Bears on 07/08/20 a day after his appt with Dr. Raliegh Ip she wanted to know   1. If that's ok meeting Dr. Raliegh Ip before seeing Dr. Curt Bears  2. Dr. Curt Bears doesn't have available until June in [place], is it ok for to wait in June to see Dr. Curt Bears in [place] and r/s her appt with Dr. Raliegh Ip in July  She wanted to ask what Dr. Raliegh Ip recommendations

## 2020-06-19 NOTE — Addendum Note (Signed)
Addended by: Senaida Ores on: 06/19/2020 01:49 PM   Modules accepted: Orders

## 2020-06-23 DIAGNOSIS — R002 Palpitations: Secondary | ICD-10-CM | POA: Diagnosis not present

## 2020-06-23 DIAGNOSIS — E785 Hyperlipidemia, unspecified: Secondary | ICD-10-CM | POA: Diagnosis not present

## 2020-06-23 DIAGNOSIS — I428 Other cardiomyopathies: Secondary | ICD-10-CM | POA: Diagnosis not present

## 2020-06-23 DIAGNOSIS — C8338 Diffuse large B-cell lymphoma, lymph nodes of multiple sites: Secondary | ICD-10-CM | POA: Diagnosis not present

## 2020-06-23 DIAGNOSIS — I5020 Unspecified systolic (congestive) heart failure: Secondary | ICD-10-CM | POA: Diagnosis not present

## 2020-06-24 ENCOUNTER — Telehealth: Payer: Self-pay | Admitting: Cardiology

## 2020-06-24 LAB — BASIC METABOLIC PANEL
BUN/Creatinine Ratio: 21 (ref 12–28)
BUN: 55 mg/dL — ABNORMAL HIGH (ref 8–27)
CO2: 21 mmol/L (ref 20–29)
Calcium: 9.2 mg/dL (ref 8.7–10.3)
Chloride: 96 mmol/L (ref 96–106)
Creatinine, Ser: 2.57 mg/dL — ABNORMAL HIGH (ref 0.57–1.00)
Glucose: 79 mg/dL (ref 65–99)
Potassium: 4.9 mmol/L (ref 3.5–5.2)
Sodium: 137 mmol/L (ref 134–144)
eGFR: 18 mL/min/{1.73_m2} — ABNORMAL LOW (ref >59)

## 2020-06-24 NOTE — Telephone Encounter (Signed)
Left message for Ms. Alexis Stephens to call and discuss preferred scheduling weekdays and times for the Cardiac MRI ordered by [facility]

## 2020-06-25 ENCOUNTER — Encounter: Payer: Self-pay | Admitting: Cardiology

## 2020-06-25 ENCOUNTER — Telehealth: Payer: Self-pay | Admitting: Cardiovascular Disease

## 2020-06-25 NOTE — Telephone Encounter (Signed)
Left message regarding Wednesday 07/30/20 11:00 am Cardiac MRI appointment at [facility]---arrival time is 10:30 am 1st floor admissions office for check in. Will mail information to patient and requested she call with questions or concerns.

## 2020-06-25 NOTE — Telephone Encounter (Signed)
Spoke with daughter Humberto Leep DPR)  regarding the Wednesday 07/30/20 11:00 am Cardiac MRI appointment at [facility]---arrival time is 10:30 1st floor admissions office for check in.  Will mail information to patient and Barbaraann Rondo voiced her understanding.

## 2020-06-26 ENCOUNTER — Telehealth: Payer: Self-pay | Admitting: Emergency Medicine

## 2020-06-26 DIAGNOSIS — Z79899 Other long term (current) drug therapy: Secondary | ICD-10-CM

## 2020-06-26 NOTE — Telephone Encounter (Signed)
Called patient informed her of results. She will decrease lasix to 80 mg only in the morning and potassium to 40 meq daily. She will repeat labs in 1 week. No further questions.

## 2020-06-26 NOTE — Telephone Encounter (Signed)
-----   Message from Park Liter, MD sent at 06/25/2020  5:15 PM EDT ----- Again you have an issue with kidney dysfunction, please reduce Lasix.  She is supposed to be taking 80 mg in the morning 40 mg in the evening time, please cut out evening dose of 40 so she will be taking only 80 mg of Lasix in the morning, please find out exactly how much potassium is she taking the dose should be reduced by one third, Chem-7 need to be rechecked next week

## 2020-06-30 ENCOUNTER — Ambulatory Visit (INDEPENDENT_AMBULATORY_CARE_PROVIDER_SITE_OTHER): Payer: Medicare Other

## 2020-06-30 DIAGNOSIS — T63441D Toxic effect of venom of bees, accidental (unintentional), subsequent encounter: Secondary | ICD-10-CM

## 2020-07-03 DIAGNOSIS — Z79899 Other long term (current) drug therapy: Secondary | ICD-10-CM | POA: Diagnosis not present

## 2020-07-03 LAB — BASIC METABOLIC PANEL
BUN/Creatinine Ratio: 16 (ref 12–28)
BUN: 17 mg/dL (ref 8–27)
CO2: 22 mmol/L (ref 20–29)
Calcium: 8.9 mg/dL (ref 8.7–10.3)
Chloride: 105 mmol/L (ref 96–106)
Creatinine, Ser: 1.05 mg/dL — ABNORMAL HIGH (ref 0.57–1.00)
Glucose: 78 mg/dL (ref 65–99)
Potassium: 5.2 mmol/L (ref 3.5–5.2)
Sodium: 140 mmol/L (ref 134–144)
eGFR: 53 mL/min/{1.73_m2} — ABNORMAL LOW (ref >59)

## 2020-07-04 ENCOUNTER — Other Ambulatory Visit: Payer: Self-pay | Admitting: Cardiology

## 2020-07-04 NOTE — Telephone Encounter (Signed)
Losartan 50 mg # 60 tablets x 1 refill sent to pharmacy

## 2020-07-07 ENCOUNTER — Ambulatory Visit (INDEPENDENT_AMBULATORY_CARE_PROVIDER_SITE_OTHER): Payer: Medicare Other | Admitting: Cardiology

## 2020-07-07 ENCOUNTER — Encounter: Payer: Self-pay | Admitting: Cardiology

## 2020-07-07 ENCOUNTER — Other Ambulatory Visit: Payer: Self-pay

## 2020-07-07 VITALS — BP 128/68 | HR 102 | Ht 61.0 in | Wt 173.0 lb

## 2020-07-07 DIAGNOSIS — I428 Other cardiomyopathies: Secondary | ICD-10-CM | POA: Diagnosis not present

## 2020-07-07 DIAGNOSIS — I5020 Unspecified systolic (congestive) heart failure: Secondary | ICD-10-CM

## 2020-07-07 DIAGNOSIS — C8338 Diffuse large B-cell lymphoma, lymph nodes of multiple sites: Secondary | ICD-10-CM | POA: Diagnosis not present

## 2020-07-07 DIAGNOSIS — E785 Hyperlipidemia, unspecified: Secondary | ICD-10-CM | POA: Diagnosis not present

## 2020-07-07 NOTE — Progress Notes (Signed)
Cardiology Office Note:    Date:  07/07/2020   ID:  Alexis Stephens, DOB March 09, 1935, MRN [id number]  PCP:  [name], MD  Cardiologist:  [name], MD    Referring MD: [name], MD   Chief Complaint  Patient presents with  . Shortness of Breath  . Leg Swelling    History of Present Illness:    Alexis Stephens is a 85 y.o. female with past medical history significant for nonischemic cardiomyopathy with initial ejection fraction diagnosed 10 years ago of 35 to 25%, she was started on appropriate medication which included beta-blocker as well as Entresto her ejection fraction improved.  However, she was not able to afford Entresto and she is taking losartan now.  Recently she had repeated echocardiogram done which showed ejection fraction 30 to 35%.  I am trying to put her on appropriate medication however what limits my ability to maximize medical therapy is mostly kidney dysfunction.  Recently she presented to my office with decompensated congestive heart failure her diuretic has been increased which resulted with raise to creatinine to 2.57.  Likely with we did reduce dose of diuretic and her kidney function normalized.  But now she is complaining again of having some shortness of breath as well as some swelling of lower extremities.  She overall complain of being weak tired and exhausted, denies have any chest pain tightness squeezing pressure burning chest.  Past Medical History:  Diagnosis Date  . Anaphylaxis due to hymenoptera venom 10/30/2014  . BMI 35.0-35.9,adult 02/10/2016  . Cancer [facility])    Stage 4 Non-Hodgkin Lymphoma, clear for the past year  . Cataract   . Diffuse large B cell lymphoma ([place]) 04/09/2020  . Dyslipidemia 10/19/2019  . Dyspnea on exertion 10/30/2015  . GERD (gastroesophageal reflux disease)   . HFrEF (heart failure with reduced ejection fraction) ([place]) 11/25/2017  . Hymenoptera allergy   . Left hip pain 02/10/2016   . Lymphadenopathy, inguinal 02/10/2016  . Nonischemic cardiomyopathy ([place]) 11/25/2017  . Overgrown toenails 09/21/2019  . Palpitations 10/30/2015  . Postoperative examination 02/17/2016  . Swelling   . Tachycardia 10/30/2015    Past Surgical History:  Procedure Laterality Date  . ABDOMINAL HYSTERECTOMY    . ADENOIDECTOMY    . APPENDECTOMY    . CHOLECYSTECTOMY    . EYE SURGERY    . NEPHROLITHOTOMY Left   . TONSILLECTOMY      Current Medications: Current Meds  Medication Sig  . carvedilol (COREG) 6.25 MG tablet TAKE 1 TABLET(6.25 MG) BY MOUTH TWICE DAILY (Patient taking differently: Take 6.25 mg by mouth 2 (two) times daily with a meal. TAKE 1 TABLET(6.25 MG) BY MOUTH TWICE DAILY)  . cyclobenzaprine (FLEXERIL) 10 MG tablet Take 1 tablet (10 mg total) by mouth 3 (three) times daily as needed for muscle spasms.  Marland Kitchen EPINEPHrine 0.3 mg/0.3 mL IJ SOAJ injection Inject 0.3 mg into the muscle as needed for anaphylaxis.  . furosemide (LASIX) 40 MG tablet Take 80 mg (2 tablets) in the morning and 40 mg (1 tablet) in the evening daily. (Patient taking differently: Take 120 mg by mouth 2 (two) times daily. Take 80 mg (2 tablets) in the morning and 40 mg (1 tablet) in the evening daily.)  . losartan (COZAAR) 50 MG tablet TAKE 1 TABLET(50 MG) BY MOUTH IN THE MORNING AND AT BEDTIME (Patient taking differently: Take 50 mg by mouth 2 (two) times daily.)  . Multiple Vitamin (MULTIVITAMIN) capsule  Take 1 capsule by mouth daily. Unknown strength  . potassium chloride (KLOR-CON) 20 MEQ packet Take 60 mEq by mouth daily.     Allergies:   Ivp dye [iodinated diagnostic agents], Mixed vespid venom, Wasp venom, Bee venom, Furadantin [nitrofurantoin], Ibuprofen, Iodine, Iodine-131, Magnesium citrate, Penicillamine, Penicillins, Sulfa antibiotics, and Sulfasalazine   Social History   Socioeconomic History  . Marital status: Widowed    Spouse name: Not on file  . Number of children: Not on file  . Years of  education: Not on file  . Highest education level: Not on file  Occupational History  . Not on file  Tobacco Use  . Smoking status: Former Smoker    Quit date: 08/24/1993    Years since quitting: 26.8  . Smokeless tobacco: Never Used  Substance and Sexual Activity  . Alcohol use: Yes    Comment: occasional  . Drug use: Never  . Sexual activity: Not on file  Other Topics Concern  . Not on file  Social History Narrative  . Not on file   Social Determinants of Health   Financial Resource Strain: Not on file  Food Insecurity: Not on file  Transportation Needs: Not on file  Physical Activity: Not on file  Stress: Not on file  Social Connections: Not on file     Family History: The patient's family history includes Cancer in her brother; Osteoarthritis in her mother; Other in her father; Rheum arthritis in her mother. ROS:   Please see the history of present illness.    All 14 point review of systems negative except as described per history of present illness  EKGs/Labs/Other Studies Reviewed:      Recent Labs: 07/12/2019: Hemoglobin 13.7; Platelets 235 05/05/2020: NT-Pro BNP 3,442 07/03/2020: BUN 17; Creatinine, Ser 1.05; Potassium 5.2; Sodium 140  Recent Lipid Panel    Component Value Date/Time   CHOL 195 06/16/2020 1501   TRIG 153 (H) 06/16/2020 1501   HDL 44 06/16/2020 1501   CHOLHDL 4.4 06/16/2020 1501   LDLCALC 124 (H) 06/16/2020 1501    Physical Exam:    VS:  BP 128/68 (BP Location: Left Arm, Patient Position: Sitting)   Pulse (!) 102   Ht 5\' 1"  (1.549 m)   Wt 173 lb (78.5 kg)   SpO2 91%   BMI 32.69 kg/m     Wt Readings from Last 3 Encounters:  07/07/20 173 lb (78.5 kg)  06/16/20 176 lb (79.8 kg)  04/28/20 175 lb (79.4 kg)     GEN:  Well nourished, well developed in no acute distress HEENT: Normal NECK: No JVD; No carotid bruits LYMPHATICS: No lymphadenopathy CARDIAC: RRR, no murmurs, no rubs, no gallops RESPIRATORY:  Clear to auscultation without  rales, wheezing or rhonchi  ABDOMEN: Soft, non-tender, non-distended MUSCULOSKELETAL:  No edema; No deformity  SKIN: Warm and dry LOWER EXTREMITIES: no swelling NEUROLOGIC:  Alert and oriented x 3 PSYCHIATRIC:  Normal affect   ASSESSMENT:    1. HFrEF (heart failure with reduced ejection fraction) ([place])   2. Nonischemic cardiomyopathy ([place])   3. Dyslipidemia   4. Diffuse large B-cell lymphoma of lymph nodes of multiple regions [facility])    PLAN:    In order of problems listed above:  1. Heart failure with reduced left ventricular ejection fraction, last estimation from March of this year ejection fraction 30 to 35%, she is on small dose of beta-blocker because of first-degree AV block, she is also on very small dose of losartan because of kidney  dysfunction as well as low blood pressure.  Overall she seems to be mildly decompensated today.  She takes only 80 mg of furosemide I will add extra 40 mg at evening time.  We will check her Chem-7 in about a week. 2. Nonischemic cardiomyopathy with ejection fraction 30 to 35%.  Tomorrow she does have overall EP team for consideration of ICD. 3. Dyslipidemia, last cholesterol profile get from 3 weeks ago showing LDL of 124.  We will try to initiate statin if she allows. 4. Diffuse large B-cell lymphoma last evaluation by oncology team showed no reactivation of the problem.   Medication Adjustments/Labs and Tests Ordered: Current medicines are reviewed at length with the patient today.  Concerns regarding medicines are outlined above.  No orders of the defined types were placed in this encounter.  Medication changes: No orders of the defined types were placed in this encounter.   Signed, Park Liter, MD, [facility] 07/07/2020 11:04 AM    [place]

## 2020-07-07 NOTE — Patient Instructions (Signed)
Medication Instructions:  Your physician has recommended you make the following change in your medication:  CHANGE: Lasix to 80 mg in the morning and 40 mg in th pm  *If you need a refill on your cardiac medications before your next appointment, please call your pharmacy*   Lab Work: Your physician recommends that you return for lab work in 1 week: bmp   If you have labs (blood work) drawn today and your tests are completely normal, you will receive your results only by: Marland Kitchen MyChart Message (if you have MyChart) OR . A paper copy in the mail If you have any lab test that is abnormal or we need to change your treatment, we will call you to review the results.   Testing/Procedures: None   Follow-Up: At [facility], you and your health needs are our priority.  As part of our continuing mission to provide you with exceptional heart care, we have created designated Provider Care Teams.  These Care Teams include your primary Cardiologist (physician) and Advanced Practice Providers (APPs -  Physician Assistants and Nurse Practitioners) who all work together to provide you with the care you need, when you need it.  We recommend signing up for the patient portal called "MyChart".  Sign up information is provided on this After Visit Summary.  MyChart is used to connect with patients for Virtual Visits (Telemedicine).  Patients are able to view lab/test results, encounter notes, upcoming appointments, etc.  Non-urgent messages can be sent to your provider as well.   To learn more about what you can do with MyChart, go to [email or web address].    Your next appointment:   1 month(s)  The format for your next appointment:   In Person  Provider:   [name], MD   Other Instructions

## 2020-07-08 ENCOUNTER — Institutional Professional Consult (permissible substitution): Payer: Medicare Other | Admitting: Cardiology

## 2020-07-08 ENCOUNTER — Encounter: Payer: Self-pay | Admitting: Cardiology

## 2020-07-08 ENCOUNTER — Telehealth: Payer: Self-pay | Admitting: Cardiology

## 2020-07-08 ENCOUNTER — Ambulatory Visit (INDEPENDENT_AMBULATORY_CARE_PROVIDER_SITE_OTHER): Payer: Medicare Other | Admitting: Cardiology

## 2020-07-08 VITALS — BP 108/64 | HR 111 | Ht 61.0 in | Wt 179.0 lb

## 2020-07-08 DIAGNOSIS — I428 Other cardiomyopathies: Secondary | ICD-10-CM | POA: Diagnosis not present

## 2020-07-08 NOTE — Telephone Encounter (Signed)
Pt called in.  She stated she was just seen today.  She has some question for the nurse regarding the [facility].  She stated it is to expensive and would like to talk with the nurse about a few things  [phone]- best call back number

## 2020-07-08 NOTE — Telephone Encounter (Signed)
**Note De-Identified  Obfuscation** See other medication management phone note from today for details.

## 2020-07-08 NOTE — Progress Notes (Signed)
Electrophysiology Office Note   Date:  07/08/2020   ID:  Alexis Stephens, DOB Sep 18, 1935, MRN [id number]  PCP:  [name], MD  Cardiologist:  Agustin Cree Primary Electrophysiologist:  Nevia Henkin Alexis Leeds, MD    Chief Complaint: CHF   History of Present Illness: Alexis Stephens is a 85 y.o. female who is being seen today for the evaluation of CHF at the request of Park Liter, MD. Presenting today for electrophysiology evaluation.  She has a history significant for nonischemic cardiomyopathy, stage IV non-Hodgkin's lymphoma status post chemo, hyperlipidemia.  She was initially on Entresto, but could not afford this medication and has been switched to losartan.  She did quite well for a while after her chemotherapy, but did develop heart failure.  She noted increased shortness of breath, weight gain, and swelling.  She had an episode where she took to manage Lasix and got very dehydrated.  Lasix was stopped and allowed to rehydrate and her kidney function did improve.  Today, she denies symptoms of palpitations, chest pain, orthopnea, PND, lower extremity edema, claudication, dizziness, presyncope, syncope, bleeding, or neurologic sequela. The patient is tolerating medications without difficulties.  Her main complaint is dyspnea on exertion.  She is able to do all of her daily activities despite her level of dyspnea.  She does move quite a bit slower than she had in the past.   Past Medical History:  Diagnosis Date  . Anaphylaxis due to hymenoptera venom 10/30/2014  . BMI 35.0-35.9,adult 02/10/2016  . Cancer [facility]    Stage 4 Non-Hodgkin Lymphoma, clear for the past year  . Cataract   . Diffuse large B cell lymphoma ([place]) 04/09/2020  . Dyslipidemia 10/19/2019  . Dyspnea on exertion 10/30/2015  . GERD (gastroesophageal reflux disease)   . HFrEF (heart failure with reduced ejection fraction) ([place]) 11/25/2017  . Hymenoptera allergy   . Left hip pain  02/10/2016  . Lymphadenopathy, inguinal 02/10/2016  . Nonischemic cardiomyopathy ([place]) 11/25/2017  . Overgrown toenails 09/21/2019  . Palpitations 10/30/2015  . Postoperative examination 02/17/2016  . Swelling   . Tachycardia 10/30/2015   Past Surgical History:  Procedure Laterality Date  . ABDOMINAL HYSTERECTOMY    . ADENOIDECTOMY    . APPENDECTOMY    . CHOLECYSTECTOMY    . EYE SURGERY    . NEPHROLITHOTOMY Left   . TONSILLECTOMY       Current Outpatient Medications  Medication Sig Dispense Refill  . carvedilol (COREG) 6.25 MG tablet TAKE 1 TABLET(6.25 MG) BY MOUTH TWICE DAILY (Patient taking differently: Take 6.25 mg by mouth 2 (two) times daily with a meal. TAKE 1 TABLET(6.25 MG) BY MOUTH TWICE DAILY) 180 tablet 3  . cyclobenzaprine (FLEXERIL) 10 MG tablet Take 1 tablet (10 mg total) by mouth 3 (three) times daily as needed for muscle spasms. 30 tablet 1  . EPINEPHrine 0.3 mg/0.3 mL IJ SOAJ injection Inject 0.3 mg into the muscle as needed for anaphylaxis.    . furosemide (LASIX) 40 MG tablet Take 80 mg (2 tablets) in the morning and 40 mg (1 tablet) in the evening daily. (Patient taking differently: Take 120 mg by mouth 2 (two) times daily. Take 80 mg (2 tablets) in the morning and 40 mg (1 tablet) in the evening daily.) 90 tablet 2  . losartan (COZAAR) 50 MG tablet TAKE 1 TABLET(50 MG) BY MOUTH IN THE MORNING AND AT BEDTIME (Patient taking differently: Take 50 mg by mouth 2 (two) times daily.) 60  tablet 1  . Multiple Vitamin (MULTIVITAMIN) capsule Take 1 capsule by mouth daily. Unknown strength    . potassium chloride (KLOR-CON) 20 MEQ packet Take 60 mEq by mouth daily.     No current facility-administered medications for this visit.    Allergies:   Ivp dye [iodinated diagnostic agents], Mixed vespid venom, Wasp venom, Bee venom, Furadantin [nitrofurantoin], Ibuprofen, Iodine, Iodine-131, Magnesium citrate, Penicillamine, Penicillins, Sulfa antibiotics, and Sulfasalazine   Social  History:  The patient  reports that she quit smoking about 26 years ago. She has never used smokeless tobacco. She reports current alcohol use. She reports that she does not use drugs.   Family History:  The patient's family history includes Cancer in her brother; Osteoarthritis in her mother; Other in her father; Rheum arthritis in her mother.    ROS:  Please see the history of present illness.   Otherwise, review of systems is positive for none.   All other systems are reviewed and negative.    PHYSICAL EXAM: VS:  BP 108/64   Pulse (!) 111   Ht 5\' 1"  (1.549 m)   Wt 179 lb (81.2 kg)   SpO2 96%   BMI 33.82 kg/m  , BMI Body mass index is 33.82 kg/m. GEN: Well nourished, well developed, in no acute distress  HEENT: normal  Neck: no JVD, carotid bruits, or masses Cardiac: RRR; no murmurs, rubs, or gallops,no edema  Respiratory:  clear to auscultation bilaterally, normal work of breathing GI: soft, nontender, nondistended, + BS MS: no deformity or atrophy  Skin: warm and dry Neuro:  Strength and sensation are intact Psych: euthymic mood, full affect  EKG:  EKG is ordered today. Personal review of the ekg ordered shows sinus rhythm, rate 111  Recent Labs: 07/12/2019: Hemoglobin 13.7; Platelets 235 05/05/2020: NT-Pro BNP 3,442 07/03/2020: BUN 17; Creatinine, Ser 1.05; Potassium 5.2; Sodium 140    Lipid Panel     Component Value Date/Time   CHOL 195 06/16/2020 1501   TRIG 153 (H) 06/16/2020 1501   HDL 44 06/16/2020 1501   CHOLHDL 4.4 06/16/2020 1501   LDLCALC 124 (H) 06/16/2020 1501     Wt Readings from Last 3 Encounters:  07/08/20 179 lb (81.2 kg)  07/07/20 173 lb (78.5 kg)  06/16/20 176 lb (79.8 kg)      Other studies Reviewed: Additional studies/ records that were reviewed today include: TTE 05/28/20  Review of the above records today demonstrates:  1. Left ventricular ejection fraction, by estimation, is 30 to 35%. The  left ventricle has moderately decreased  function.  2. On view 15 and 16, echodensity seen in right heart/RVOT of unclear  clinical significance. Clinical correlation suggested. May need CT/CRI if  appropriate for further assessment. This could be artifactual.  3. Left atrial size was moderately dilated.  4. The mitral valve is normal in structure. Moderate mitral valve  regurgitation. No evidence of mitral stenosis.  5. The aortic valve is normal in structure. Aortic valve regurgitation is  moderate to severe. No aortic stenosis is present.  6. The inferior vena cava is normal in size with greater than 50%  respiratory variability, suggesting right atrial pressure of 3 mmHg.    ASSESSMENT AND PLAN:  1.  Chronic systolic heart failure due to nonischemic cardiomyopathy: Currently on beta-blocker and losartan.  She also takes Lasix for volume overload.  Her ejection fraction remains low at 30 to 35%.  I did discuss the risks and benefits of ICD implant.  In  discussion with the patient and family, we both feel that age 58, that the patient would not get much benefit from ICD therapy.  There is data that supports this.  We Alexis Stephens allow further therapy per primary cardiology.  We Alexis Stephens look into getting Entresto to be more affordable for her.  2.  Hyperlipidemia: Statin per primary cardiology  3.  Diffuse large B-cell lymphoma: Evaluation by oncology showed no reactivation of the lymphoma.  Case discussed with primary cardiology.  Current medicines are reviewed at length with the patient today.   The patient does not have concerns regarding her medicines.  The following changes were made today:  none  Labs/ tests ordered today include:  Orders Placed This Encounter  Procedures  . EKG 12-Lead     Disposition:   FU with Alexis Stephens as needed  Signed, Alexis Stephens Alexis Leeds, MD  07/08/2020 10:21 AM     [facility] [address] [place] [address] [phone] (office) [phone] (fax)

## 2020-07-08 NOTE — Patient Instructions (Signed)
Medication Instructions:  Your physician recommends that you continue on your current medications as directed. Please refer to the Current Medication list given to you today.  *If you need a refill on your cardiac medications before your next appointment, please call your pharmacy*   Lab Work: None ordered   Testing/Procedures: None ordered   Follow-Up: At [facility], you and your health needs are our priority.  As part of our continuing mission to provide you with exceptional heart care, we have created designated Provider Care Teams.  These Care Teams include your primary Cardiologist (physician) and Advanced Practice Providers (APPs -  Physician Assistants and Nurse Practitioners) who all work together to provide you with the care you need, when you need it.  We recommend signing up for the patient portal called "MyChart".  Sign up information is provided on this After Visit Summary.  MyChart is used to connect with patients for Virtual Visits (Telemedicine).  Patients are able to view lab/test results, encounter notes, upcoming appointments, etc.  Non-urgent messages can be sent to your provider as well.   To learn more about what you can do with MyChart, go to [email or web address].    Your next appointment:    as needed  The format for your next appointment:   In Person  Provider:   Allegra Lai, MD    Thank you for choosing [facility]!!   Trinidad Curet, RN [phone]   Other Instructions   [employer] Patient English as a second language teacher for [employer]: [phone]

## 2020-07-08 NOTE — Telephone Encounter (Signed)
**Note De-Identified  Obfuscation** The pt sees both Dr Curt Bears and Dr Agustin Cree in our [place] office. She is calling to let me know that she has called [employer] pt asst and was advised that they are mailing her an application.  I did advise her that once she receives her application to complete her part, obtain requested documents per [employer], and to bring all to Dr Curt Bears office to drop off or mail to the [employer] [address] office.  She states she lives to far away to bring her [employer] pt asst application for Entresto to [address] location and that Dr Agustin Cree is the MD who put her on Entresto last year (she could not afford at that time) and that she discussed it with Dr Curt Bears today.  She states she will bring it to our [place] office when she gets it all together.  I will forward this phone note to our [facility] triage pool so they can handle when application is received or to forward to the appropriate person to handle.

## 2020-07-08 NOTE — Telephone Encounter (Signed)
  Patient is calling to speak to [facility] in regards to the expense of her Entresto. Sherri suggested she call [employer].

## 2020-07-10 DIAGNOSIS — I5020 Unspecified systolic (congestive) heart failure: Secondary | ICD-10-CM | POA: Diagnosis not present

## 2020-07-10 DIAGNOSIS — I428 Other cardiomyopathies: Secondary | ICD-10-CM | POA: Diagnosis not present

## 2020-07-10 DIAGNOSIS — E785 Hyperlipidemia, unspecified: Secondary | ICD-10-CM | POA: Diagnosis not present

## 2020-07-10 DIAGNOSIS — C8338 Diffuse large B-cell lymphoma, lymph nodes of multiple sites: Secondary | ICD-10-CM | POA: Diagnosis not present

## 2020-07-11 LAB — BASIC METABOLIC PANEL
BUN/Creatinine Ratio: 14 (ref 12–28)
BUN: 15 mg/dL (ref 8–27)
CO2: 19 mmol/L — ABNORMAL LOW (ref 20–29)
Calcium: 9.2 mg/dL (ref 8.7–10.3)
Chloride: 106 mmol/L (ref 96–106)
Creatinine, Ser: 1.09 mg/dL — ABNORMAL HIGH (ref 0.57–1.00)
Glucose: 75 mg/dL (ref 65–99)
Potassium: 4.4 mmol/L (ref 3.5–5.2)
Sodium: 143 mmol/L (ref 134–144)
eGFR: 50 mL/min/{1.73_m2} — ABNORMAL LOW (ref >59)

## 2020-07-14 ENCOUNTER — Telehealth: Payer: Self-pay | Admitting: Cardiology

## 2020-07-14 ENCOUNTER — Other Ambulatory Visit: Payer: Self-pay | Admitting: Cardiology

## 2020-07-14 NOTE — Telephone Encounter (Signed)
Patient is returning call to regarding her lab results. She states she has also been SOB, but she states it has been going on a while and she is not concerned with it. She declined discussing the SOB in detail. States she just wants to go over her results.

## 2020-07-14 NOTE — Telephone Encounter (Signed)
Called patient informed her of results. No further questions.

## 2020-07-29 ENCOUNTER — Telehealth (HOSPITAL_COMMUNITY): Payer: Self-pay | Admitting: Emergency Medicine

## 2020-07-29 NOTE — Telephone Encounter (Signed)
Reaching out to patient to offer assistance regarding upcoming cardiac imaging study; pt verbalizes understanding of appt date/time, parking situation and where to check in and verified current allergies; name and call back number provided for further questions should they arise Marchia Bond RN Navigator Cardiac Imaging [facility] [phone] office [phone] cell  Only implant is a [employer] Powerport. (can start peripheral IV or access port, patient doesn't have preference) Denies claustro Clarise Cruz

## 2020-07-30 ENCOUNTER — Other Ambulatory Visit: Payer: Self-pay

## 2020-07-30 ENCOUNTER — Ambulatory Visit (HOSPITAL_COMMUNITY)
Admission: RE | Admit: 2020-07-30 | Discharge: 2020-07-30 | Disposition: A | Discharge status: Home / Self Care | Payer: Medicare Other | Source: Ambulatory Visit | Attending: Cardiology | Admitting: Cardiology

## 2020-07-30 DIAGNOSIS — I5020 Unspecified systolic (congestive) heart failure: Secondary | ICD-10-CM | POA: Insufficient documentation

## 2020-07-30 DIAGNOSIS — E785 Hyperlipidemia, unspecified: Secondary | ICD-10-CM | POA: Diagnosis not present

## 2020-07-30 DIAGNOSIS — R002 Palpitations: Secondary | ICD-10-CM | POA: Diagnosis not present

## 2020-07-30 DIAGNOSIS — I428 Other cardiomyopathies: Secondary | ICD-10-CM | POA: Insufficient documentation

## 2020-07-30 MED ORDER — GADOBUTROL 1 MMOL/ML IV SOLN
10.0000 mL | Freq: Once | INTRAVENOUS | Status: AC | PRN
  Administered 2020-07-30: 10 mL via INTRAVENOUS

## 2020-08-14 ENCOUNTER — Ambulatory Visit: Payer: Medicare Other | Admitting: Cardiology

## 2020-08-25 ENCOUNTER — Ambulatory Visit (INDEPENDENT_AMBULATORY_CARE_PROVIDER_SITE_OTHER): Payer: Medicare Other | Admitting: *Deleted

## 2020-08-25 ENCOUNTER — Other Ambulatory Visit: Payer: Self-pay

## 2020-08-25 DIAGNOSIS — T63441D Toxic effect of venom of bees, accidental (unintentional), subsequent encounter: Secondary | ICD-10-CM

## 2020-08-27 ENCOUNTER — Encounter: Payer: Self-pay | Admitting: Cardiology

## 2020-08-27 ENCOUNTER — Ambulatory Visit (INDEPENDENT_AMBULATORY_CARE_PROVIDER_SITE_OTHER): Payer: Medicare Other | Admitting: Cardiology

## 2020-08-27 ENCOUNTER — Other Ambulatory Visit: Payer: Self-pay

## 2020-08-27 VITALS — BP 126/84 | HR 106 | Ht 61.0 in | Wt 173.0 lb

## 2020-08-27 DIAGNOSIS — R002 Palpitations: Secondary | ICD-10-CM | POA: Diagnosis not present

## 2020-08-27 DIAGNOSIS — I5020 Unspecified systolic (congestive) heart failure: Secondary | ICD-10-CM | POA: Diagnosis not present

## 2020-08-27 DIAGNOSIS — I428 Other cardiomyopathies: Secondary | ICD-10-CM | POA: Diagnosis not present

## 2020-08-27 DIAGNOSIS — C8338 Diffuse large B-cell lymphoma, lymph nodes of multiple sites: Secondary | ICD-10-CM

## 2020-08-27 MED ORDER — ENTRESTO 49-51 MG PO TABS: 1.0000 | ORAL_TABLET | Freq: Two times a day (BID) | ORAL | 3 refills | Status: DC

## 2020-08-27 NOTE — Progress Notes (Signed)
Cardiology Office Note:    Date:  08/27/2020   ID:  [name] Gari Trovato, DOB 25-Nov-1935, MRN [id number]  PCP:  [name], MD  Cardiologist:  [name], MD    Referring MD: [name], MD   Chief Complaint  Patient presents with   Shortness of Breath   Leg Swelling    History of Present Illness:    Alexis Stephens is a 85 y.o. female with past medical history significant for nonischemic cardiomyopathy with ejection fraction about 30 to 35%, history of non-Hodgkin's lymphoma, dyslipidemia, congestive heart failure.  Comes today 2 months of follow-up she does have decompensated CHF there is minimal swelling of lower extremities as well as proximal nocturnal dyspnea on top of that dyspnea on exertion.  Said the last time we will try to still be more aggressive diuresis led to significant kidney dysfunction.  She denies have any chest pain tightness squeezing pressure burning chest otherwise seems to be doing well.  She did see EP team for consideration for ICD but because of her age it was felt not to be beneficial to her.  Past Medical History:  Diagnosis Date   Anaphylaxis due to hymenoptera venom 10/30/2014   BMI 35.0-35.9,adult 02/10/2016   Cancer ([place])    Stage 4 Non-Hodgkin Lymphoma, clear for the past year   Cataract    Diffuse large B cell lymphoma ([place]) 04/09/2020   Dyslipidemia 10/19/2019   Dyspnea on exertion 10/30/2015   GERD (gastroesophageal reflux disease)    HFrEF (heart failure with reduced ejection fraction) ([place]) 11/25/2017   Hymenoptera allergy    Left hip pain 02/10/2016   Lymphadenopathy, inguinal 02/10/2016   Nonischemic cardiomyopathy ([place]) 11/25/2017   Overgrown toenails 09/21/2019   Palpitations 10/30/2015   Postoperative examination 02/17/2016   Swelling    Tachycardia 10/30/2015    Past Surgical History:  Procedure Laterality Date   ABDOMINAL HYSTERECTOMY     ADENOIDECTOMY     APPENDECTOMY     CHOLECYSTECTOMY      EYE SURGERY     NEPHROLITHOTOMY Left    TONSILLECTOMY      Current Medications: Current Meds  Medication Sig   carvedilol (COREG) 6.25 MG tablet TAKE 1 TABLET(6.25 MG) BY MOUTH TWICE DAILY (Patient taking differently: Take 6.25 mg by mouth 2 (two) times daily with a meal. TAKE 1 TABLET(6.25 MG) BY MOUTH TWICE DAILY)   cyclobenzaprine (FLEXERIL) 10 MG tablet Take 1 tablet (10 mg total) by mouth 3 (three) times daily as needed for muscle spasms.   EPINEPHrine 0.3 mg/0.3 mL IJ SOAJ injection Inject 0.3 mg into the muscle as needed for anaphylaxis.   furosemide (LASIX) 40 MG tablet Take 80 mg (2 tablets) in the morning and 40 mg (1 tablet) in the evening daily. (Patient taking differently: Take 120 mg by mouth 2 (two) times daily. Take 80 mg (2 tablets) in the morning and 40 mg (1 tablet) in the evening daily.)   losartan (COZAAR) 50 MG tablet TAKE 1 TABLET(50 MG) BY MOUTH IN THE MORNING AND AT BEDTIME (Patient taking differently: Take 50 mg by mouth 2 (two) times daily.)   Multiple Vitamin (MULTIVITAMIN) capsule Take 1 capsule by mouth daily. Unknown strength   potassium chloride (KLOR-CON) 20 MEQ packet Take 60 mEq by mouth daily.     Allergies:   Ivp dye [iodinated diagnostic agents], Mixed vespid venom, Wasp venom, Bee venom, Furadantin [nitrofurantoin], Ibuprofen, Iodine, Iodine-131, Magnesium citrate, Penicillamine, Penicillins, Sulfa antibiotics, and  Sulfasalazine   Social History   Socioeconomic History   Marital status: Widowed    Spouse name: Not on file   Number of children: Not on file   Years of education: Not on file   Highest education level: Not on file  Occupational History   Not on file  Tobacco Use   Smoking status: Former    Pack years: 0.00    Types: Cigarettes    Quit date: 08/24/1993    Years since quitting: 27.0   Smokeless tobacco: Never  Substance and Sexual Activity   Alcohol use: Yes    Comment: occasional   Drug use: Never   Sexual activity: Not on  file  Other Topics Concern   Not on file  Social History Narrative   Not on file   Social Determinants of Health   Financial Resource Strain: Not on file  Food Insecurity: Not on file  Transportation Needs: Not on file  Physical Activity: Not on file  Stress: Not on file  Social Connections: Not on file     Family History: The patient's family history includes Cancer in her brother; Osteoarthritis in her mother; Other in her father; Rheum arthritis in her mother. ROS:   Please see the history of present illness.    All 14 point review of systems negative except as described per history of present illness  EKGs/Labs/Other Studies Reviewed:      Recent Labs: 05/05/2020: NT-Pro BNP 3,442 07/10/2020: BUN 15; Creatinine, Ser 1.09; Potassium 4.4; Sodium 143  Recent Lipid Panel    Component Value Date/Time   CHOL 195 06/16/2020 1501   TRIG 153 (H) 06/16/2020 1501   HDL 44 06/16/2020 1501   CHOLHDL 4.4 06/16/2020 1501   LDLCALC 124 (H) 06/16/2020 1501    Physical Exam:    VS:  BP 126/84 (BP Location: Right Arm, Patient Position: Sitting)   Pulse (!) 106   Ht 5\' 1"  (1.549 m)   Wt 173 lb (78.5 kg)   SpO2 91%   BMI 32.69 kg/m     Wt Readings from Last 3 Encounters:  08/27/20 173 lb (78.5 kg)  07/08/20 179 lb (81.2 kg)  07/07/20 173 lb (78.5 kg)     GEN:  Well nourished, well developed in no acute distress HEENT: Normal NECK: No JVD; No carotid bruits LYMPHATICS: No lymphadenopathy CARDIAC: RRR, no murmurs, no rubs, no gallops RESPIRATORY:  Clear to auscultation without rales, wheezing or rhonchi  ABDOMEN: Soft, non-tender, non-distended MUSCULOSKELETAL:  No edema; No deformity  SKIN: Warm and dry LOWER EXTREMITIES: Minimal swelling NEUROLOGIC:  Alert and oriented x 3 PSYCHIATRIC:  Normal affect   ASSESSMENT:    1. HFrEF (heart failure with reduced ejection fraction) ([place])   2. Nonischemic cardiomyopathy ([place])   3. Diffuse large B-cell lymphoma of lymph nodes  of multiple regions (HCC)   4. Palpitations    PLAN:    In order of problems listed above:  Heart failure with reduction of the ventricle ejection fraction I strongly believe that she can benefit from [place] however she cannot afford it.  She agreed to take 24/26 twice daily which we will do.  We will stop losartan.  We will check Chem-7 with next few days.  We will continue with beta-blocker small dose as well as a diuretic.  I will check Chem-7 within the next week or so and if it is fine I will increase dose of diuretic to help with decongestion. Nonischemic cardiomyopathy plan as described above  Diffuse large B-cell lymphoma stable followed by antimedicine team and oncology. Palpitations stable   Medication Adjustments/Labs and Tests Ordered: Current medicines are reviewed at length with the patient today.  Concerns regarding medicines are outlined above.  No orders of the defined types were placed in this encounter.  Medication changes: No orders of the defined types were placed in this encounter.   Signed, Park Liter, MD, [facility] 08/27/2020 2:15 PM    [facility]

## 2020-08-27 NOTE — Patient Instructions (Signed)
Medication Instructions:  Your physician has recommended you make the following change in your medication:   STOP: losartan  START: entresto 49/51 mg - 1/2 tablet twice daily.   *If you need a refill on your cardiac medications before your next appointment, please call your pharmacy*   Lab Work: Your physician recommends that you return for lab work Tuesday: BMP  If you have labs (blood work) drawn today and your tests are completely normal, you will receive your results only by: [place] (if you have MyChart) OR A paper copy in the mail If you have any lab test that is abnormal or we need to change your treatment, we will call you to review the results.   Testing/Procedures: None   Follow-Up: At [facility], you and your health needs are our priority.  As part of our continuing mission to provide you with exceptional heart care, we have created designated Provider Care Teams.  These Care Teams include your primary Cardiologist (physician) and Advanced Practice Providers (APPs -  Physician Assistants and Nurse Practitioners) who all work together to provide you with the care you need, when you need it.  We recommend signing up for the patient portal called "MyChart".  Sign up information is provided on this After Visit Summary.  MyChart is used to connect with patients for Virtual Visits (Telemedicine).  Patients are able to view lab/test results, encounter notes, upcoming appointments, etc.  Non-urgent messages can be sent to your provider as well.   To learn more about what you can do with MyChart, go to [email or web address].    Your next appointment:   2 month(s)  The format for your next appointment:   In Person  Provider:   [name], MD   Other Instructions

## 2020-09-02 DIAGNOSIS — I5020 Unspecified systolic (congestive) heart failure: Secondary | ICD-10-CM | POA: Diagnosis not present

## 2020-09-02 LAB — BASIC METABOLIC PANEL
BUN/Creatinine Ratio: 14 (ref 12–28)
BUN: 20 mg/dL (ref 8–27)
CO2: 24 mmol/L (ref 20–29)
Calcium: 9.2 mg/dL (ref 8.7–10.3)
Chloride: 102 mmol/L (ref 96–106)
Creatinine, Ser: 1.45 mg/dL — ABNORMAL HIGH (ref 0.57–1.00)
Glucose: 92 mg/dL (ref 65–99)
Potassium: 4.2 mmol/L (ref 3.5–5.2)
Sodium: 141 mmol/L (ref 134–144)
eGFR: 36 mL/min/{1.73_m2} — ABNORMAL LOW (ref >59)

## 2020-09-03 ENCOUNTER — Telehealth: Payer: Self-pay

## 2020-09-03 NOTE — Telephone Encounter (Signed)
Spoke with patient regarding results and recommendation.  Patient verbalizes understanding and is agreeable to plan of care. Advised patient to call back with any issues or concerns.  

## 2020-09-03 NOTE — Telephone Encounter (Signed)
-----   Message from Park Liter, MD sent at 09/03/2020 12:35 PM EDT ----- Blood test looks good, kidney function slightly worse but definitely much better than before, acceptable, continue present management

## 2020-09-15 DIAGNOSIS — R002 Palpitations: Secondary | ICD-10-CM | POA: Diagnosis not present

## 2020-09-15 DIAGNOSIS — I428 Other cardiomyopathies: Secondary | ICD-10-CM | POA: Diagnosis not present

## 2020-09-15 DIAGNOSIS — R000 Tachycardia, unspecified: Secondary | ICD-10-CM | POA: Diagnosis not present

## 2020-09-15 DIAGNOSIS — Z79899 Other long term (current) drug therapy: Secondary | ICD-10-CM | POA: Diagnosis not present

## 2020-09-15 DIAGNOSIS — E785 Hyperlipidemia, unspecified: Secondary | ICD-10-CM | POA: Diagnosis not present

## 2020-09-15 DIAGNOSIS — R0600 Dyspnea, unspecified: Secondary | ICD-10-CM | POA: Diagnosis not present

## 2020-09-15 DIAGNOSIS — K219 Gastro-esophageal reflux disease without esophagitis: Secondary | ICD-10-CM | POA: Diagnosis not present

## 2020-09-15 DIAGNOSIS — I5022 Chronic systolic (congestive) heart failure: Secondary | ICD-10-CM | POA: Diagnosis not present

## 2020-09-17 DIAGNOSIS — I5022 Chronic systolic (congestive) heart failure: Secondary | ICD-10-CM | POA: Diagnosis not present

## 2020-09-17 DIAGNOSIS — I428 Other cardiomyopathies: Secondary | ICD-10-CM | POA: Diagnosis not present

## 2020-09-17 DIAGNOSIS — R0600 Dyspnea, unspecified: Secondary | ICD-10-CM | POA: Diagnosis not present

## 2020-09-17 DIAGNOSIS — E785 Hyperlipidemia, unspecified: Secondary | ICD-10-CM | POA: Diagnosis not present

## 2020-09-17 DIAGNOSIS — K219 Gastro-esophageal reflux disease without esophagitis: Secondary | ICD-10-CM | POA: Diagnosis not present

## 2020-09-17 DIAGNOSIS — R002 Palpitations: Secondary | ICD-10-CM | POA: Diagnosis not present

## 2020-09-19 DIAGNOSIS — I5022 Chronic systolic (congestive) heart failure: Secondary | ICD-10-CM | POA: Diagnosis not present

## 2020-09-19 DIAGNOSIS — R0600 Dyspnea, unspecified: Secondary | ICD-10-CM | POA: Diagnosis not present

## 2020-09-19 DIAGNOSIS — R002 Palpitations: Secondary | ICD-10-CM | POA: Diagnosis not present

## 2020-09-19 DIAGNOSIS — K219 Gastro-esophageal reflux disease without esophagitis: Secondary | ICD-10-CM | POA: Diagnosis not present

## 2020-09-19 DIAGNOSIS — E785 Hyperlipidemia, unspecified: Secondary | ICD-10-CM | POA: Diagnosis not present

## 2020-09-19 DIAGNOSIS — I428 Other cardiomyopathies: Secondary | ICD-10-CM | POA: Diagnosis not present

## 2020-09-22 DIAGNOSIS — R002 Palpitations: Secondary | ICD-10-CM | POA: Diagnosis not present

## 2020-09-22 DIAGNOSIS — I5022 Chronic systolic (congestive) heart failure: Secondary | ICD-10-CM | POA: Diagnosis not present

## 2020-09-22 DIAGNOSIS — I428 Other cardiomyopathies: Secondary | ICD-10-CM | POA: Diagnosis not present

## 2020-09-22 DIAGNOSIS — K219 Gastro-esophageal reflux disease without esophagitis: Secondary | ICD-10-CM | POA: Diagnosis not present

## 2020-09-22 DIAGNOSIS — R0600 Dyspnea, unspecified: Secondary | ICD-10-CM | POA: Diagnosis not present

## 2020-09-22 DIAGNOSIS — E785 Hyperlipidemia, unspecified: Secondary | ICD-10-CM | POA: Diagnosis not present

## 2020-09-24 ENCOUNTER — Telehealth: Payer: Self-pay | Admitting: Cardiology

## 2020-09-24 NOTE — Telephone Encounter (Signed)
*  STAT* If patient is at the pharmacy, call can be transferred to refill team.   1. Which medications need to be refilled? (please list name of each medication and dose if known) need a handwritten prescription for Entresto- [place] mail this prescription to her for   2. Which pharmacy/location (including street and city if local pharmacy) is medication to be sent to? [facility] [place] [facility] [place]  3. Do they need a 30 day or 90 day supply? 90 days and refills

## 2020-09-24 NOTE — Telephone Encounter (Signed)
Spoke to the patient just now and she let me know that she is filling out patient assistance paperwork and needs Dr. Agustin Cree to fill out his portion. She is going to bring this by the office next week.    Encouraged patient to call back with any questions or concerns.

## 2020-09-29 DIAGNOSIS — I5022 Chronic systolic (congestive) heart failure: Secondary | ICD-10-CM | POA: Diagnosis not present

## 2020-10-01 DIAGNOSIS — I5022 Chronic systolic (congestive) heart failure: Secondary | ICD-10-CM | POA: Diagnosis not present

## 2020-10-03 ENCOUNTER — Telehealth: Payer: Self-pay | Admitting: Cardiology

## 2020-10-03 DIAGNOSIS — I5022 Chronic systolic (congestive) heart failure: Secondary | ICD-10-CM | POA: Diagnosis not present

## 2020-10-03 NOTE — Telephone Encounter (Signed)
Spoke to [place] just now and she let me know that it appears that Mrs Attig is in atrial fibrillation based off of their monitor. She states that she will do an EKG on Monday and will fax it over to [place] for Dr. Agustin Cree to review.   I will route to Dr. Agustin Cree to make him aware.

## 2020-10-03 NOTE — Telephone Encounter (Signed)
   [facility] calling, she wanted to speak with a nurse to provide updates about pt.

## 2020-10-08 DIAGNOSIS — I5022 Chronic systolic (congestive) heart failure: Secondary | ICD-10-CM | POA: Diagnosis not present

## 2020-10-10 DIAGNOSIS — I5022 Chronic systolic (congestive) heart failure: Secondary | ICD-10-CM | POA: Diagnosis not present

## 2020-10-13 ENCOUNTER — Other Ambulatory Visit: Payer: Self-pay

## 2020-10-13 ENCOUNTER — Ambulatory Visit (INDEPENDENT_AMBULATORY_CARE_PROVIDER_SITE_OTHER): Payer: Medicare Other | Admitting: *Deleted

## 2020-10-13 DIAGNOSIS — T63441D Toxic effect of venom of bees, accidental (unintentional), subsequent encounter: Secondary | ICD-10-CM

## 2020-10-13 DIAGNOSIS — I5022 Chronic systolic (congestive) heart failure: Secondary | ICD-10-CM | POA: Diagnosis not present

## 2020-10-14 NOTE — Progress Notes (Signed)
[facility]  [address] [place],  [place]  [address] [phone]  Clinic Day:  10/21/2020  Referring physician: [name], MD  This document serves as a record of services personally performed by Dequincy Macarthur Critchley, MD. It was created on their behalf by [facility] E, a trained medical scribe. The creation of this record is based on the scribe's personal observations and the provider's statements to them.  HISTORY OF PRESENT ILLNESS:  The patient is a 84 y.o. female with  stage IVA diffuse large B-cell lymphoma, which included pulmonary, subcutaneous, osseous and muscular involvement.  Her PET scan showed a complete response after she completed her 6 cycles of R-CHOP chemotherapy in April 2018.  The patient comes in today for routine follow up.  Since her last visit, the patient has been doing well.  She has chronic left hip pain from where her previous lymphoma fractured her left hip.  However, she has not wanted surgery to correct this.  She denies having any B symptoms or bulky lymphadenopathy which concern her for late disease recurrence.     PHYSICAL EXAM:  Blood pressure 119/65, pulse 99, temperature 97.9 F (36.6 C), resp. rate 16, height '5\' 1"'$  (1.549 m), weight 171 lb 4.8 oz (77.7 kg), SpO2 94 %. Wt Readings from Last 3 Encounters:  10/21/20 170 lb 12.8 oz (77.5 kg)  10/21/20 171 lb 4.8 oz (77.7 kg)  08/27/20 173 lb (78.5 kg)   Body mass index is 32.37 kg/m. Performance status (ECOG): 1 - Symptomatic but completely ambulatory Physical Exam Constitutional:      Appearance: Normal appearance. She is not ill-appearing.  HENT:     Mouth/Throat:     Mouth: Mucous membranes are moist.     Pharynx: Oropharynx is clear. No oropharyngeal exudate or posterior oropharyngeal erythema.  Cardiovascular:     Rate and Rhythm: Normal rate and regular rhythm.     Heart sounds: No murmur heard.   No friction rub. No gallop.  Pulmonary:      Effort: Pulmonary effort is normal. No respiratory distress.     Breath sounds: Normal breath sounds. No wheezing, rhonchi or rales.  Abdominal:     General: Bowel sounds are normal. There is no distension.     Palpations: Abdomen is soft. There is no mass.     Tenderness: There is no abdominal tenderness.  Musculoskeletal:        General: No swelling.     Right lower leg: No edema.     Left lower leg: No edema.  Lymphadenopathy:     Cervical: No cervical adenopathy.     Upper Body:     Right upper body: No supraclavicular or axillary adenopathy.     Left upper body: No supraclavicular or axillary adenopathy.     Lower Body: No right inguinal adenopathy. No left inguinal adenopathy.  Skin:    General: Skin is warm.     Coloration: Skin is not jaundiced.     Findings: No lesion or rash.  Neurological:     General: No focal deficit present.     Mental Status: She is alert and oriented to person, place, and time. Mental status is at baseline.     Cranial Nerves: Cranial nerves are intact.  Psychiatric:        Mood and Affect: Mood normal.        Behavior: Behavior normal.        Thought Content: Thought content  normal.   LABS:   Results for Alexis Mane ANN BOLISKE "BETTY" (MRN [id number]) as of 10/21/2020 14:15  Ref. Range 10/21/2020 00:00  Sodium Latest Ref Range: 137 - 147  143  Potassium Latest Ref Range: 3.4 - 5.3  4.8  Chloride Latest Ref Range: 99 - 108  107  CO2 Latest Ref Range: 13 - 22  27 (A)  Glucose Unknown 90  BUN Latest Ref Range: 4 - 21  24 (A)  Creatinine Latest Ref Range: 0.5 - 1.1  1.4 (A)  Calcium Latest Ref Range: 8.7 - 10.7  9.3  Alkaline Phosphatase Latest Ref Range: 25 - 125  47  Albumin Latest Ref Range: 3.5 - 5.0  4.3  AST Latest Ref Range: 13 - 35  31  ALT Latest Ref Range: 7 - 35  10  Bilirubin, Total Unknown 0.6  WBC Unknown 4.1  RBC Latest Ref Range: 3.87 - 5.11  3.69 (A)  Hemoglobin Latest Ref Range: 12.0 - 16.0  13.0  HCT Latest Ref  Range: 36 - 46  39  Platelets Latest Ref Range: 150 - 399  209   Ldh pending  ASSESSMENT & PLAN:  Assessment/Plan:  An 85 y.o. female with stage IVA diffuse large B-cell lymphoma, who completed 6 cycles of R-CHOP chemotherapy in April 2018.  Based upon her labs and physical exam today, the patient remains disease free.  Clinically, she is doing well.  I will see her back in April 2018 for repeat clinical assessment.  If she remains disease free at that time, it will mark her being 5 years cancer free for which I would consider her cured of her disease. The patient understands all the plans discussed today and is in agreement with them.     I, Rita Ohara, am acting as scribe for Marice Potter, MD    I have reviewed this report as typed by the medical scribe, and it is complete and accurate.  Dequincy Macarthur Critchley, MD

## 2020-10-15 DIAGNOSIS — I5022 Chronic systolic (congestive) heart failure: Secondary | ICD-10-CM | POA: Diagnosis not present

## 2020-10-17 ENCOUNTER — Telehealth: Payer: Self-pay | Admitting: Oncology

## 2020-10-17 NOTE — Telephone Encounter (Signed)
Patient called to verify 8/23 Appt's

## 2020-10-20 ENCOUNTER — Ambulatory Visit: Payer: Medicare Other

## 2020-10-21 ENCOUNTER — Inpatient Hospital Stay: Payer: Medicare Other

## 2020-10-21 ENCOUNTER — Telehealth: Payer: Self-pay | Admitting: Oncology

## 2020-10-21 ENCOUNTER — Encounter: Payer: Self-pay | Admitting: Cardiology

## 2020-10-21 ENCOUNTER — Other Ambulatory Visit: Payer: Self-pay | Admitting: Oncology

## 2020-10-21 ENCOUNTER — Inpatient Hospital Stay: Payer: Medicare Other | Attending: Oncology | Admitting: Oncology

## 2020-10-21 ENCOUNTER — Other Ambulatory Visit: Payer: Self-pay

## 2020-10-21 ENCOUNTER — Ambulatory Visit (INDEPENDENT_AMBULATORY_CARE_PROVIDER_SITE_OTHER): Payer: Medicare Other | Admitting: Cardiology

## 2020-10-21 ENCOUNTER — Encounter: Payer: Self-pay | Admitting: Oncology

## 2020-10-21 ENCOUNTER — Telehealth: Payer: Self-pay

## 2020-10-21 VITALS — BP 119/65 | HR 99 | Temp 97.9°F | Resp 16 | Ht 61.0 in | Wt 171.3 lb

## 2020-10-21 VITALS — BP 116/68 | HR 89 | Ht 61.0 in | Wt 170.8 lb

## 2020-10-21 DIAGNOSIS — C801 Malignant (primary) neoplasm, unspecified: Secondary | ICD-10-CM | POA: Diagnosis not present

## 2020-10-21 DIAGNOSIS — C8339 Diffuse large B-cell lymphoma, extranodal and solid organ sites: Secondary | ICD-10-CM

## 2020-10-21 DIAGNOSIS — C8338 Diffuse large B-cell lymphoma, lymph nodes of multiple sites: Secondary | ICD-10-CM

## 2020-10-21 DIAGNOSIS — D649 Anemia, unspecified: Secondary | ICD-10-CM | POA: Diagnosis not present

## 2020-10-21 DIAGNOSIS — I428 Other cardiomyopathies: Secondary | ICD-10-CM

## 2020-10-21 DIAGNOSIS — R002 Palpitations: Secondary | ICD-10-CM

## 2020-10-21 DIAGNOSIS — I5020 Unspecified systolic (congestive) heart failure: Secondary | ICD-10-CM

## 2020-10-21 DIAGNOSIS — C8330 Diffuse large B-cell lymphoma, unspecified site: Secondary | ICD-10-CM | POA: Diagnosis not present

## 2020-10-21 LAB — BASIC METABOLIC PANEL
BUN: 24 — AB (ref 4–21)
CO2: 27 — AB (ref 13–22)
Chloride: 107 (ref 99–108)
Creatinine: 1.4 — AB (ref 0.5–1.1)
Glucose: 90
Potassium: 4.8 (ref 3.4–5.3)
Sodium: 143 (ref 137–147)

## 2020-10-21 LAB — LACTATE DEHYDROGENASE: LDH: 178 U/L (ref 98–192)

## 2020-10-21 LAB — CBC AND DIFFERENTIAL
HCT: 39 (ref 36–46)
Hemoglobin: 13 (ref 12.0–16.0)
Neutrophils Absolute: 2.75
Platelets: 209 (ref 150–399)
WBC: 4.1

## 2020-10-21 LAB — HEPATIC FUNCTION PANEL
ALT: 10 (ref 7–35)
AST: 31 (ref 13–35)
Alkaline Phosphatase: 47 (ref 25–125)
Bilirubin, Total: 0.6

## 2020-10-21 LAB — COMPREHENSIVE METABOLIC PANEL
Albumin: 4.3 (ref 3.5–5.0)
Calcium: 9.3 (ref 8.7–10.7)

## 2020-10-21 LAB — CBC: RBC: 3.69 — AB (ref 3.87–5.11)

## 2020-10-21 MED ORDER — POTASSIUM CHLORIDE CRYS ER 20 MEQ PO TBCR: 40.0000 meq | EXTENDED_RELEASE_TABLET | Freq: Every day | ORAL | 3 refills | Status: DC

## 2020-10-21 MED ORDER — SPIRONOLACTONE 25 MG PO TABS: 12.5000 mg | ORAL_TABLET | Freq: Every day | ORAL | 3 refills | Status: DC

## 2020-10-21 NOTE — Telephone Encounter (Addendum)
Labs printed, & will be mailed to pt home as requested.

## 2020-10-21 NOTE — Addendum Note (Signed)
Addended by: Jerl Santos R on: 10/21/2020 01:56 PM   Modules accepted: Orders

## 2020-10-21 NOTE — Telephone Encounter (Signed)
Scheduled appt per 8/23 los - called patient and she is aware of appt date and time

## 2020-10-21 NOTE — Telephone Encounter (Signed)
Per 8/23 LOS, patient scheduled for April 2023 Appt's.  Gave patient Appt Summary

## 2020-10-21 NOTE — Progress Notes (Signed)
Cardiology Office Note:    Date:  10/21/2020   ID:  Alexis Stephens, DOB 1935-08-05, MRN [id number]  PCP:  Alexis Sheriff, MD  Cardiologist:  [name], MD    Referring MD: Alexis Sheriff, MD   Chief Complaint  Patient presents with   L foot swelling   Shortness of Breath    History of Present Illness:    Alexis Stephens is a 85 y.o. female with past medical history significant for nonischemic cardiomyopathy with ejection fraction 30 to 35%, history of non-Hodgkin's lymphoma, dyslipidemia, congestive heart failure [place] [employer] 3.  She comes today 2 months of follow-up.  Overall she is doing fine but complain of having shortness of breath and fatigue as well as swelling of lower extremities.  She admits however that sometimes she does not take her diuretic the way she should because she is worried she will have to urinate a lot she also admits when she takes [facility] proper way she feels much better she is trying to save the medication because of prices of that medication does what she does not take Entresto every single day.  Past Medical History:  Diagnosis Date   Anaphylaxis due to hymenoptera venom 10/30/2014   BMI 35.0-35.9,adult 02/10/2016   Cancer ([place])    Stage 4 Non-Hodgkin Lymphoma, clear for the past year   Cataract    Diffuse large B cell lymphoma ([place]) 04/09/2020   Dyslipidemia 10/19/2019   Dyspnea on exertion 10/30/2015   GERD (gastroesophageal reflux disease)    HFrEF (heart failure with reduced ejection fraction) ([place]) 11/25/2017   Hymenoptera allergy    Left hip pain 02/10/2016   Lymphadenopathy, inguinal 02/10/2016   Nonischemic cardiomyopathy ([place]) 11/25/2017   Overgrown toenails 09/21/2019   Palpitations 10/30/2015   Postoperative examination 02/17/2016   Swelling    Tachycardia 10/30/2015    Past Surgical History:  Procedure Laterality Date   ABDOMINAL HYSTERECTOMY     ADENOIDECTOMY     APPENDECTOMY      CHOLECYSTECTOMY     EYE SURGERY     NEPHROLITHOTOMY Left    TONSILLECTOMY      Current Medications: Current Meds  Medication Sig   sacubitril-valsartan (ENTRESTO) 49-51 MG Take 1 tablet by mouth 2 (two) times daily. (Patient taking differently: Take 0.5 tablets by mouth 2 (two) times daily.)     Allergies:   Ivp dye [iodinated diagnostic agents], Mixed vespid venom, Wasp venom, Bee venom, Furadantin [nitrofurantoin], Ibuprofen, Iodine, Iodine-131, Magnesium citrate, Penicillamine, Penicillins, Sulfa antibiotics, and Sulfasalazine   Social History   Socioeconomic History   Marital status: Widowed    Spouse name: Not on file   Number of children: Not on file   Years of education: Not on file   Highest education level: Not on file  Occupational History   Not on file  Tobacco Use   Smoking status: Former    Types: Cigarettes    Quit date: 08/24/1993    Years since quitting: 27.1   Smokeless tobacco: Never  Substance and Sexual Activity   Alcohol use: Yes    Comment: occasional   Drug use: Never   Sexual activity: Not on file  Other Topics Concern   Not on file  Social History Narrative   Not on file   Social Determinants of Health   Financial Resource Strain: Not on file  Food Insecurity: Not on file  Transportation Needs: Not on file  Physical Activity: Not on  file  Stress: Not on file  Social Connections: Not on file     Family History: The patient's family history includes Cancer in her brother; Osteoarthritis in her mother; Other in her father; Rheum arthritis in her mother. ROS:   Please see the history of present illness.    All 14 point review of systems negative except as described per history of present illness  EKGs/Labs/Other Studies Reviewed:      Recent Labs: 05/05/2020: NT-Pro BNP 3,442 10/21/2020: ALT 10; BUN 24; Creatinine 1.4; Hemoglobin 13.0; Platelets 209; Potassium 4.8; Sodium 143  Recent Lipid Panel    Component Value Date/Time   CHOL 195  06/16/2020 1501   TRIG 153 (H) 06/16/2020 1501   HDL 44 06/16/2020 1501   CHOLHDL 4.4 06/16/2020 1501   LDLCALC 124 (H) 06/16/2020 1501    Physical Exam:    VS:  BP 116/68 (BP Location: Left Arm, Patient Position: Sitting)   Pulse 89   Ht '5\' 1"'$  (1.549 m)   Wt 170 lb 12.8 oz (77.5 kg)   SpO2 95%   BMI 32.27 kg/m     Wt Readings from Last 3 Encounters:  10/21/20 170 lb 12.8 oz (77.5 kg)  10/21/20 171 lb 4.8 oz (77.7 kg)  08/27/20 173 lb (78.5 kg)     GEN:  Well nourished, well developed in no acute distress HEENT: Normal NECK: No JVD; No carotid bruits LYMPHATICS: No lymphadenopathy CARDIAC: RRR, no murmurs, no rubs, no gallops RESPIRATORY:  Clear to auscultation without rales, wheezing or rhonchi  ABDOMEN: Soft, non-tender, non-distended MUSCULOSKELETAL:  No edema; No deformity  SKIN: Warm and dry LOWER EXTREMITIES: no swelling NEUROLOGIC:  Alert and oriented x 3 PSYCHIATRIC:  Normal affect   ASSESSMENT:    1. Nonischemic cardiomyopathy (HCC)   2. HFrEF (heart failure with reduced ejection fraction) (HCC)   3. Palpitations   4. Cancer ([place])   5. Diffuse large B-cell lymphoma of lymph nodes of multiple regions [facility])    PLAN:    In order of problems listed above:  Nonischemic cardiomyopathy on the physical exam she looks quite compensated.  Her weight is stable.  I did review her Chem-7 done by her oncologist today creatinine is 1.4 potassium 4.8.  I will add Aldactone 12.5 mg daily to her medical regiment, she takes 60 mg of potassium will reduce this to only 40 mg and next week we will check her Chem-7 to make sure kidney function as well as potassium is still within reasonable range.  We will continue Entresto as well as beta-blocker. Palpitation denies having any Large cell lymphoma I did review data about her oncologist think stable in the medevac she told me that she is ready to discharge her from clinic   Medication Adjustments/Labs and Tests Ordered: Current  medicines are reviewed at length with the patient today.  Concerns regarding medicines are outlined above.  No orders of the defined types were placed in this encounter.  Medication changes: No orders of the defined types were placed in this encounter.   Signed, Park Liter, MD, [facility] 10/21/2020 1:38 PM    [place] [facility] [place]

## 2020-10-21 NOTE — Patient Instructions (Signed)
Medication Instructions:  Your physician has recommended you make the following change in your medication:  START ALDACTONE 12.5 MG ONCE DAILY DECREASE POTASSIUM TO 40 MG ONCE DAILY  *If you need a refill on your cardiac medications before your next appointment, please call your pharmacy*   Lab Work: Your physician recommends that you return for lab work in: [place]  If you have labs (blood work) drawn today and your tests are completely normal, you will receive your results only by: [place] (if you have MyChart) OR A paper copy in the mail If you have any lab test that is abnormal or we need to change your treatment, we will call you to review the results.   Testing/Procedures: NONE   Follow-Up: At [facility], you and your health needs are our priority.  As part of our continuing mission to provide you with exceptional heart care, we have created designated Provider Care Teams.  These Care Teams include your primary Cardiologist (physician) and Advanced Practice Providers (APPs -  Physician Assistants and Nurse Practitioners) who all work together to provide you with the care you need, when you need it.  We recommend signing up for the patient portal called "MyChart".  Sign up information is provided on this After Visit Summary.  MyChart is used to connect with patients for Virtual Visits (Telemedicine).  Patients are able to view lab/test results, encounter notes, upcoming appointments, etc.  Non-urgent messages can be sent to your provider as well.   To learn more about what you can do with MyChart, go to [email or web address].    Your next appointment:   3 month(s)  The format for your next appointment:   In Person  Provider:   [name], MD   Other Instructions SAMPLE OF ENTRESTO '49MG'$ /'51MG'$  (1 BOTTLE) GIVEN TO PT

## 2020-10-22 DIAGNOSIS — I5022 Chronic systolic (congestive) heart failure: Secondary | ICD-10-CM | POA: Diagnosis not present

## 2020-10-24 DIAGNOSIS — I5022 Chronic systolic (congestive) heart failure: Secondary | ICD-10-CM | POA: Diagnosis not present

## 2020-10-27 DIAGNOSIS — I5022 Chronic systolic (congestive) heart failure: Secondary | ICD-10-CM | POA: Diagnosis not present

## 2020-10-28 ENCOUNTER — Telehealth: Payer: Self-pay | Admitting: Cardiology

## 2020-10-28 DIAGNOSIS — C801 Malignant (primary) neoplasm, unspecified: Secondary | ICD-10-CM | POA: Diagnosis not present

## 2020-10-28 DIAGNOSIS — C8338 Diffuse large B-cell lymphoma, lymph nodes of multiple sites: Secondary | ICD-10-CM | POA: Diagnosis not present

## 2020-10-28 DIAGNOSIS — I428 Other cardiomyopathies: Secondary | ICD-10-CM | POA: Diagnosis not present

## 2020-10-28 DIAGNOSIS — I5020 Unspecified systolic (congestive) heart failure: Secondary | ICD-10-CM | POA: Diagnosis not present

## 2020-10-28 DIAGNOSIS — R002 Palpitations: Secondary | ICD-10-CM | POA: Diagnosis not present

## 2020-10-28 NOTE — Telephone Encounter (Signed)
Called by [facility] with K+ 6.7 (no visible hemolysis) her Cr is also elevated at 1.85 from 1.4.  I asked her not to take mes at home the K+, entresto and spironolactone.  I explained the danger of K+ 6.7 and have asked her to go to ER for labs and possible treatment.   She was agreeable. 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    I called the charge Nurse at [facility] as well to expect the pt.

## 2020-10-29 ENCOUNTER — Encounter (HOSPITAL_BASED_OUTPATIENT_CLINIC_OR_DEPARTMENT_OTHER): Payer: Self-pay

## 2020-10-29 ENCOUNTER — Other Ambulatory Visit: Payer: Self-pay

## 2020-10-29 ENCOUNTER — Inpatient Hospital Stay (HOSPITAL_BASED_OUTPATIENT_CLINIC_OR_DEPARTMENT_OTHER)
Admission: EM | Admit: 2020-10-29 | Discharge: 2020-10-31 | DRG: 683 | Disposition: A | Discharge status: Home / Self Care | Payer: Medicare Other | Attending: Internal Medicine | Admitting: Internal Medicine

## 2020-10-29 ENCOUNTER — Telehealth: Payer: Self-pay | Admitting: Cardiology

## 2020-10-29 DIAGNOSIS — C8330 Diffuse large B-cell lymphoma, unspecified site: Secondary | ICD-10-CM | POA: Diagnosis present

## 2020-10-29 DIAGNOSIS — Z91030 Bee allergy status: Secondary | ICD-10-CM | POA: Diagnosis not present

## 2020-10-29 DIAGNOSIS — Z90710 Acquired absence of both cervix and uterus: Secondary | ICD-10-CM | POA: Diagnosis not present

## 2020-10-29 DIAGNOSIS — Z66 Do not resuscitate: Secondary | ICD-10-CM | POA: Diagnosis present

## 2020-10-29 DIAGNOSIS — I5022 Chronic systolic (congestive) heart failure: Secondary | ICD-10-CM | POA: Diagnosis present

## 2020-10-29 DIAGNOSIS — K219 Gastro-esophageal reflux disease without esophagitis: Secondary | ICD-10-CM | POA: Diagnosis present

## 2020-10-29 DIAGNOSIS — R9431 Abnormal electrocardiogram [ECG] [EKG]: Secondary | ICD-10-CM | POA: Diagnosis not present

## 2020-10-29 DIAGNOSIS — N1832 Chronic kidney disease, stage 3b: Secondary | ICD-10-CM | POA: Diagnosis present

## 2020-10-29 DIAGNOSIS — Z91041 Radiographic dye allergy status: Secondary | ICD-10-CM

## 2020-10-29 DIAGNOSIS — Z79899 Other long term (current) drug therapy: Secondary | ICD-10-CM | POA: Diagnosis not present

## 2020-10-29 DIAGNOSIS — N179 Acute kidney failure, unspecified: Principal | ICD-10-CM

## 2020-10-29 DIAGNOSIS — Z20822 Contact with and (suspected) exposure to covid-19: Secondary | ICD-10-CM | POA: Diagnosis present

## 2020-10-29 DIAGNOSIS — Z8572 Personal history of non-Hodgkin lymphomas: Secondary | ICD-10-CM

## 2020-10-29 DIAGNOSIS — Z87891 Personal history of nicotine dependence: Secondary | ICD-10-CM | POA: Diagnosis not present

## 2020-10-29 DIAGNOSIS — E785 Hyperlipidemia, unspecified: Secondary | ICD-10-CM | POA: Diagnosis not present

## 2020-10-29 DIAGNOSIS — E875 Hyperkalemia: Secondary | ICD-10-CM | POA: Diagnosis not present

## 2020-10-29 DIAGNOSIS — Z87892 Personal history of anaphylaxis: Secondary | ICD-10-CM | POA: Diagnosis not present

## 2020-10-29 DIAGNOSIS — Z888 Allergy status to other drugs, medicaments and biological substances status: Secondary | ICD-10-CM

## 2020-10-29 DIAGNOSIS — Y842 Radiological procedure and radiotherapy as the cause of abnormal reaction of the patient, or of later complication, without mention of misadventure at the time of the procedure: Secondary | ICD-10-CM | POA: Diagnosis not present

## 2020-10-29 DIAGNOSIS — I427 Cardiomyopathy due to drug and external agent: Secondary | ICD-10-CM | POA: Diagnosis not present

## 2020-10-29 DIAGNOSIS — R739 Hyperglycemia, unspecified: Secondary | ICD-10-CM | POA: Diagnosis not present

## 2020-10-29 DIAGNOSIS — I5020 Unspecified systolic (congestive) heart failure: Secondary | ICD-10-CM | POA: Diagnosis present

## 2020-10-29 LAB — COMPREHENSIVE METABOLIC PANEL
ALT: 12 U/L (ref 0–44)
AST: 21 U/L (ref 15–41)
Albumin: 4.6 g/dL (ref 3.5–5.0)
Alkaline Phosphatase: 36 U/L — ABNORMAL LOW (ref 38–126)
Anion gap: 12 (ref 5–15)
BUN: 38 mg/dL — ABNORMAL HIGH (ref 8–23)
CO2: 21 mmol/L — ABNORMAL LOW (ref 22–32)
Calcium: 9.5 mg/dL (ref 8.9–10.3)
Chloride: 102 mmol/L (ref 98–111)
Creatinine, Ser: 2.51 mg/dL — ABNORMAL HIGH (ref 0.44–1.00)
GFR, Estimated: 19 mL/min — ABNORMAL LOW (ref >60)
Glucose, Bld: 89 mg/dL (ref 70–99)
Potassium: 6.2 mmol/L — ABNORMAL HIGH (ref 3.5–5.1)
Sodium: 135 mmol/L (ref 135–145)
Total Bilirubin: 0.8 mg/dL (ref 0.3–1.2)
Total Protein: 7.9 g/dL (ref 6.5–8.1)

## 2020-10-29 LAB — BASIC METABOLIC PANEL
Anion gap: 11 (ref 5–15)
BUN/Creatinine Ratio: 15 (ref 12–28)
BUN: 28 mg/dL — ABNORMAL HIGH (ref 8–27)
BUN: 40 mg/dL — ABNORMAL HIGH (ref 8–23)
CO2: 19 mmol/L — ABNORMAL LOW (ref 20–29)
CO2: 25 mmol/L (ref 22–32)
Calcium: 10.2 mg/dL (ref 8.7–10.3)
Calcium: 10.5 mg/dL — ABNORMAL HIGH (ref 8.9–10.3)
Chloride: 102 mmol/L (ref 96–106)
Chloride: 107 mmol/L (ref 98–111)
Creatinine, Ser: 1.85 mg/dL — ABNORMAL HIGH (ref 0.57–1.00)
Creatinine, Ser: 2.13 mg/dL — ABNORMAL HIGH (ref 0.44–1.00)
GFR, Estimated: 23 mL/min — ABNORMAL LOW (ref >60)
Glucose, Bld: 96 mg/dL (ref 70–99)
Glucose: 93 mg/dL (ref 65–99)
Potassium: 6.7 mmol/L (ref 3.5–5.2)
Potassium: 5.2 mmol/L — ABNORMAL HIGH (ref 3.5–5.1)
Sodium: 138 mmol/L (ref 134–144)
Sodium: 143 mmol/L (ref 135–145)
eGFR: 27 mL/min/{1.73_m2} — ABNORMAL LOW (ref >59)

## 2020-10-29 LAB — CBC WITH DIFFERENTIAL/PLATELET
Abs Immature Granulocytes: 0.03 10*3/uL (ref 0.00–0.07)
Basophils Absolute: 0.1 10*3/uL (ref 0.0–0.1)
Basophils Relative: 1 %
Eosinophils Absolute: 0.1 10*3/uL (ref 0.0–0.5)
Eosinophils Relative: 1 %
HCT: 43 % (ref 36.0–46.0)
Hemoglobin: 14.2 g/dL (ref 12.0–15.0)
Immature Granulocytes: 1 %
Lymphocytes Relative: 12 %
Lymphs Abs: 0.8 10*3/uL (ref 0.7–4.0)
MCH: 35 pg — ABNORMAL HIGH (ref 26.0–34.0)
MCHC: 33 g/dL (ref 30.0–36.0)
MCV: 105.9 fL — ABNORMAL HIGH (ref 80.0–100.0)
Monocytes Absolute: 0.5 10*3/uL (ref 0.1–1.0)
Monocytes Relative: 8 %
Neutro Abs: 5 10*3/uL (ref 1.7–7.7)
Neutrophils Relative %: 77 %
Platelets: 235 10*3/uL (ref 150–400)
RBC: 4.06 MIL/uL (ref 3.87–5.11)
RDW: 11.6 % (ref 11.5–15.5)
WBC: 6.5 10*3/uL (ref 4.0–10.5)
nRBC: 0 % (ref 0.0–0.2)

## 2020-10-29 LAB — RESP PANEL BY RT-PCR (FLU A&B, COVID) ARPGX2
Influenza A by PCR: NEGATIVE
Influenza B by PCR: NEGATIVE
SARS Coronavirus 2 by RT PCR: NEGATIVE

## 2020-10-29 LAB — MAGNESIUM: Magnesium: 2 mg/dL (ref 1.7–2.4)

## 2020-10-29 MED ORDER — SODIUM ZIRCONIUM CYCLOSILICATE 10 G PO PACK
10.0000 g | PACK | Freq: Once | ORAL | Status: AC
  Administered 2020-10-29: 10 g via ORAL
  Filled 2020-10-29: qty 1

## 2020-10-29 MED ORDER — ACETAMINOPHEN 650 MG RE SUPP: 650.0000 mg | Freq: Four times a day (QID) | RECTAL | Status: DC | PRN

## 2020-10-29 MED ORDER — ACETAMINOPHEN 325 MG PO TABS: 650.0000 mg | ORAL_TABLET | Freq: Four times a day (QID) | ORAL | Status: DC | PRN

## 2020-10-29 MED ORDER — SODIUM CHLORIDE 0.9 % IV SOLN: INTRAVENOUS | Status: AC

## 2020-10-29 MED ORDER — FUROSEMIDE 40 MG PO TABS
80.0000 mg | ORAL_TABLET | Freq: Every day | ORAL | Status: DC
  Administered 2020-10-30 – 2020-10-31 (×2): 80 mg via ORAL
  Filled 2020-10-29 (×2): qty 2

## 2020-10-29 MED ORDER — HEPARIN SODIUM (PORCINE) 5000 UNIT/ML IJ SOLN
5000.0000 [IU] | Freq: Three times a day (TID) | INTRAMUSCULAR | Status: DC
  Administered 2020-10-30 – 2020-10-31 (×4): 5000 [IU] via SUBCUTANEOUS
  Filled 2020-10-29 (×4): qty 1

## 2020-10-29 MED ORDER — CARVEDILOL 6.25 MG PO TABS
6.2500 mg | ORAL_TABLET | Freq: Two times a day (BID) | ORAL | Status: DC
  Filled 2020-10-29: qty 1

## 2020-10-29 MED ORDER — ONDANSETRON HCL 4 MG/2ML IJ SOLN: 4.0000 mg | Freq: Four times a day (QID) | INTRAMUSCULAR | Status: DC | PRN

## 2020-10-29 MED ORDER — CALCIUM GLUCONATE-NACL 1-0.675 GM/50ML-% IV SOLN
1.0000 g | Freq: Once | INTRAVENOUS | Status: AC
Start: 2020-10-29 — End: 2020-10-29
  Administered 2020-10-29: 1000 mg via INTRAVENOUS
  Filled 2020-10-29: qty 50

## 2020-10-29 MED ORDER — OXYCODONE HCL 5 MG PO TABS: 5.0000 mg | ORAL_TABLET | ORAL | Status: DC | PRN

## 2020-10-29 MED ORDER — CYCLOBENZAPRINE HCL 10 MG PO TABS
10.0000 mg | ORAL_TABLET | Freq: Three times a day (TID) | ORAL | Status: DC | PRN
Start: 2020-10-29 — End: 2020-10-31

## 2020-10-29 MED ORDER — TRAZODONE HCL 50 MG PO TABS: 50.0000 mg | ORAL_TABLET | Freq: Every evening | ORAL | Status: DC | PRN

## 2020-10-29 MED ORDER — SODIUM BICARBONATE 8.4 % IV SOLN
50.0000 meq | Freq: Once | INTRAVENOUS | Status: AC
  Administered 2020-10-29: 50 meq via INTRAVENOUS
  Filled 2020-10-29: qty 50

## 2020-10-29 MED ORDER — ONDANSETRON HCL 4 MG PO TABS: 4.0000 mg | ORAL_TABLET | Freq: Four times a day (QID) | ORAL | Status: DC | PRN

## 2020-10-29 NOTE — ED Notes (Signed)
[facility] is here to transport the patient.

## 2020-10-29 NOTE — ED Notes (Signed)
PIV line flushed well between calcium/bicarb doses. Bicarb given over 5 minutes.

## 2020-10-29 NOTE — ED Triage Notes (Signed)
Pt states she was advised yesterday of elevated K+-NAD-steady gait with own cane

## 2020-10-29 NOTE — ED Notes (Signed)
Patient reports high potassium (6.7) yesterday, told by PCP to come to ED, denies any other complaints.

## 2020-10-29 NOTE — Telephone Encounter (Signed)
   Pt is requesting to speak with a nurse regarding her medication and recent hospital visit. She doesn't want to provide anymore info

## 2020-10-29 NOTE — ED Notes (Signed)
ED Provider at bedside. MD

## 2020-10-29 NOTE — ED Provider Notes (Signed)
[facility] DEPART[facility] Provider Note   CSN: [id number] Arrival date & time: 10/29/20  1110     History Chief Complaint  Patient presents with   Abnormal Lab    Alexis Stephens is a 85 y.o. female presenting for evaluation of hyperkalemia.  Patient states she saw her cardiologist for recheck of her labs yesterday, and was informed that her potassium was high.  She was instructed to come to the ER for further evaluation.  She was started on spironolactone about a week ago.  She is also noted to have slightly worse kidney function.  She takes potassium supplementation, 60 mg a day.  Patient denies any symptoms.  She denies fever, chest pain, shortness of breath, palpitations, dizziness/lightheadedness, nausea, vomiting, urinary symptoms, abnormal bowel movements.  Additional history obtained from chart review.  Patient with a history of non-Hodgkin's lymphoma in remission, CHF/cardiomyopathy secondary to radiation.   HPI     Past Medical History:  Diagnosis Date   Anaphylaxis due to hymenoptera venom 10/30/2014   BMI 35.0-35.9,adult 02/10/2016   Cancer ([place])    Stage 4 Non-Hodgkin Lymphoma, clear for the past year   Cataract    Diffuse large B cell lymphoma ([place]) 04/09/2020   Dyslipidemia 10/19/2019   Dyspnea on exertion 10/30/2015   GERD (gastroesophageal reflux disease)    HFrEF (heart failure with reduced ejection fraction) ([place]) 11/25/2017   Hymenoptera allergy    Left hip pain 02/10/2016   Lymphadenopathy, inguinal 02/10/2016   Nonischemic cardiomyopathy ([employer]) 11/25/2017   Overgrown toenails 09/21/2019   Palpitations 10/30/2015   Postoperative examination 02/17/2016   Swelling    Tachycardia 10/30/2015    Patient Active Problem List   Diagnosis Date Noted   Hyperkalemia 10/29/2020   Diffuse large B cell lymphoma ([place]) 04/09/2020   Swelling    Hymenoptera allergy    GERD (gastroesophageal reflux disease)    Cataract    Cancer ([place])     Dyslipidemia 10/19/2019   Overgrown toenails 09/21/2019   HFrEF (heart failure with reduced ejection fraction) ([place]) 11/25/2017   Nonischemic cardiomyopathy ([place]) 11/25/2017   Postoperative examination 02/17/2016   BMI 35.0-35.9,adult 02/10/2016   Left hip pain 02/10/2016   Lymphadenopathy, inguinal 02/10/2016   Palpitations 10/30/2015   Tachycardia 10/30/2015   Anaphylaxis due to hymenoptera venom 10/30/2014    Past Surgical History:  Procedure Laterality Date   ABDOMINAL HYSTERECTOMY     ADENOIDECTOMY     APPENDECTOMY     CHOLECYSTECTOMY     EYE SURGERY     NEPHROLITHOTOMY Left    TONSILLECTOMY       OB History   No obstetric history on file.     Family History  Problem Relation Age of Onset   Rheum arthritis Mother    Osteoarthritis Mother    Other Father        Asbestosis   Cancer Brother     Social History   Tobacco Use   Smoking status: Former    Types: Cigarettes    Quit date: 08/24/1993    Years since quitting: 27.2   Smokeless tobacco: Never  Substance Use Topics   Alcohol use: Yes    Comment: occasional   Drug use: Never    Home Medications Prior to Admission medications   Medication Sig Start Date End Date Taking? Authorizing Provider  carvedilol (COREG) 6.25 MG tablet TAKE 1 TABLET(6.25 MG) BY MOUTH TWICE DAILY Patient taking differently: Take 6.25 mg by mouth 2 (two) times daily  with a meal. TAKE 1 TABLET(6.25 MG) BY MOUTH TWICE DAILY 07/14/20   Park Liter, MD  cyclobenzaprine (FLEXERIL) 10 MG tablet Take 1 tablet (10 mg total) by mouth 3 (three) times daily as needed for muscle spasms. 04/28/20   [place] Scrape A, NP  EPINEPHrine 0.3 mg/0.3 mL IJ SOAJ injection Inject 0.3 mg into the muscle as needed for anaphylaxis.    [provider]  furosemide (LASIX) 40 MG tablet Take 80 mg (2 tablets) in the morning and 40 mg (1 tablet) in the evening daily. Patient taking differently: Take 120 mg by mouth 2 (two) times daily. Take 80 mg  (2 tablets) in the morning and 40 mg (1 tablet) in the evening daily. 06/16/20   Park Liter, MD  Multiple Vitamin (MULTIVITAMIN) capsule Take 1 capsule by mouth daily. Unknown strength    [provider]  potassium chloride SA (KLOR-CON) 20 MEQ tablet Take 2 tablets (40 mEq total) by mouth daily. 10/21/20   Park Liter, MD  sacubitril-valsartan (ENTRESTO) 49-51 MG Take 1 tablet by mouth 2 (two) times daily. Patient taking differently: Take 0.5 tablets by mouth 2 (two) times daily. 08/27/20   Park Liter, MD  spironolactone (ALDACTONE) 25 MG tablet Take 0.5 tablets (12.5 mg total) by mouth daily. 10/21/20   Park Liter, MD    Allergies    Ivp dye [iodinated diagnostic agents], Mixed vespid venom, Wasp venom, Bee venom, Furadantin [nitrofurantoin], Ibuprofen, Iodine, Iodine-131, Magnesium citrate, Penicillamine, Penicillins, Sulfa antibiotics, and Sulfasalazine  Review of Systems   Review of Systems  All other systems reviewed and are negative.  Physical Exam Updated Vital Signs BP 96/65 (BP Location: Right Arm)   Pulse (!) 103   Temp 97.8 F (36.6 C) (Oral)   Resp 16   Ht '5\' 1"'$  (1.549 m)   Wt 74.8 kg   SpO2 98%   BMI 31.18 kg/m   Physical Exam Vitals and nursing note reviewed.  Constitutional:      General: She is not in acute distress.    Appearance: Normal appearance.     Comments: Resting in the bed in NAD  HENT:     Head: Normocephalic and atraumatic.  Eyes:     Conjunctiva/sclera: Conjunctivae normal.     Pupils: Pupils are equal, round, and reactive to light.  Cardiovascular:     Rate and Rhythm: Normal rate and regular rhythm.     Pulses: Normal pulses.  Pulmonary:     Effort: Pulmonary effort is normal. No respiratory distress.     Breath sounds: Normal breath sounds. No wheezing.     Comments: Speaking in full sentences.  Clear lung sounds in all fields. Abdominal:     General: There is no distension.     Palpations:  Abdomen is soft. There is no mass.     Tenderness: There is no abdominal tenderness. There is no guarding or rebound.  Musculoskeletal:        General: Normal range of motion.     Cervical back: Normal range of motion and neck supple.     Right lower leg: No edema.     Left lower leg: No edema.     Comments: No leg swelling  Skin:    General: Skin is warm and dry.     Capillary Refill: Capillary refill takes less than 2 seconds.  Neurological:     Mental Status: She is alert and oriented to person, place, and time.  Psychiatric:  Mood and Affect: Mood and affect normal.        Speech: Speech normal.        Behavior: Behavior normal.    ED Results / Procedures / Treatments   Labs (all labs ordered are listed, but only abnormal results are displayed) Labs Reviewed  CBC WITH DIFFERENTIAL/PLATELET - Abnormal; Notable for the following components:      Result Value   MCV 105.9 (*)    MCH 35.0 (*)    All other components within normal limits  COMPREHENSIVE METABOLIC PANEL - Abnormal; Notable for the following components:   Potassium 6.2 (*)    CO2 21 (*)    BUN 38 (*)    Creatinine, Ser 2.51 (*)    Alkaline Phosphatase 36 (*)    GFR, Estimated 19 (*)    All other components within normal limits  RESP PANEL BY RT-PCR (FLU A&B, COVID) [id number]  MAGNESIUM    EKG EKG Interpretation  Date/Time:  Wednesday October 29 2020 13:09:34 EDT Ventricular Rate:  97 PR Interval:  236 QRS Duration: 110 QT Interval:  342 QTC Calculation: 435 R Axis:   -24 Text Interpretation: Sinus rhythm Prolonged PR interval Abnormal R-wave progression, late transition LVH with IVCD and secondary repol abnrm No old tracing to compare Confirmed by Varney Biles [phone]) on 10/29/2020 1:47:13 PM  Radiology No results found.  Procedures .Critical Care  Date/Time: 10/29/2020 3:52 PM Performed by: Franchot Heidelberg, PA-C Authorized by: Franchot Heidelberg, PA-C   Critical care provider statement:     Critical care time (minutes):  35   Critical care time was exclusive of:  Separately billable procedures and treating other patients and teaching time   Critical care was necessary to treat or prevent imminent or life-threatening deterioration of the following conditions:  Metabolic crisis and renal failure   Critical care was time spent personally by me on the following activities:  Blood draw for specimens, development of treatment plan with patient or surrogate, evaluation of patient's response to treatment, examination of patient, obtaining history from patient or surrogate, ordering and performing treatments and interventions, ordering and review of laboratory studies, ordering and review of radiographic studies, pulse oximetry, re-evaluation of patient's condition and review of old charts   I assumed direction of critical care for this patient from another provider in my specialty: no     Care discussed with: admitting provider     Medications Ordered in ED Medications  sodium zirconium cyclosilicate (LOKELMA) packet 10 g (10 g Oral Given 10/29/20 1347)  calcium gluconate 1 g/ 50 mL sodium chloride IVPB (0 g Intravenous Stopped 10/29/20 1422)  sodium bicarbonate injection 50 mEq (50 mEq Intravenous Given 10/29/20 1425)    ED Course  I have reviewed the triage vital signs and the nursing notes.  Pertinent labs & imaging results that were available during my care of the patient were reviewed by me and considered in my medical decision making (see chart for details).    MDM Rules/Calculators/A&P                           Patient presented for evaluation of hyperkalemia.  On exam, patient peers nontoxic.  She is asymptomatic now.  We will recheck labs to look at potassium level today.  We will also recheck kidney function.  Will obtain EKG to ensure no peaked T waves.  Labs show elevated K at 6.2 and elevated SCr from baseline. This has changed  significantly in the past week. EKG  reassuring, no peaked t waves or widened qrs. Will tx hyperkalemia and plan for admission for management of elevated hyperkalemia and AKI. Discussed with pt, who is agreeable to plan.   Discussed with Dr Eliseo Squires from [facility] hospitalist service, pt to be admitted to [facility] long hospital.   Final Clinical Impression(s) / ED Diagnoses Final diagnoses:  Hyperkalemia  AKI (acute kidney injury) [facility])    Rx / [place] Orders ED Discharge Orders     None        Franchot Heidelberg, PA-C 10/29/20 [place], [place], MD 10/30/20 [phone]

## 2020-10-29 NOTE — Telephone Encounter (Signed)
Recommendations reviewed with pt as per Dr. Munley's note.  Pt verbalized understanding and had no additional questions.  

## 2020-10-29 NOTE — Telephone Encounter (Signed)
PT states that she went to [facility] last pm but was told the wait was 4 hours she decided to leave as she is having no symptoms. Pt states she had taken her meds within the hour of having her labs done yesterday which included her potassium and she feels this is why her potassium was elevated. Will route to Dr. Bettina Gavia as he is the DOD. How do you advise?

## 2020-10-30 DIAGNOSIS — Z79899 Other long term (current) drug therapy: Secondary | ICD-10-CM | POA: Diagnosis not present

## 2020-10-30 DIAGNOSIS — Z87892 Personal history of anaphylaxis: Secondary | ICD-10-CM | POA: Diagnosis not present

## 2020-10-30 DIAGNOSIS — I427 Cardiomyopathy due to drug and external agent: Secondary | ICD-10-CM | POA: Diagnosis present

## 2020-10-30 DIAGNOSIS — I5020 Unspecified systolic (congestive) heart failure: Secondary | ICD-10-CM | POA: Diagnosis not present

## 2020-10-30 DIAGNOSIS — Y842 Radiological procedure and radiotherapy as the cause of abnormal reaction of the patient, or of later complication, without mention of misadventure at the time of the procedure: Secondary | ICD-10-CM | POA: Diagnosis present

## 2020-10-30 DIAGNOSIS — E875 Hyperkalemia: Secondary | ICD-10-CM | POA: Diagnosis present

## 2020-10-30 DIAGNOSIS — N1832 Chronic kidney disease, stage 3b: Secondary | ICD-10-CM | POA: Diagnosis present

## 2020-10-30 DIAGNOSIS — E785 Hyperlipidemia, unspecified: Secondary | ICD-10-CM | POA: Diagnosis present

## 2020-10-30 DIAGNOSIS — Z20822 Contact with and (suspected) exposure to covid-19: Secondary | ICD-10-CM | POA: Diagnosis present

## 2020-10-30 DIAGNOSIS — Z91041 Radiographic dye allergy status: Secondary | ICD-10-CM | POA: Diagnosis not present

## 2020-10-30 DIAGNOSIS — Z888 Allergy status to other drugs, medicaments and biological substances status: Secondary | ICD-10-CM | POA: Diagnosis not present

## 2020-10-30 DIAGNOSIS — Z90710 Acquired absence of both cervix and uterus: Secondary | ICD-10-CM | POA: Diagnosis not present

## 2020-10-30 DIAGNOSIS — K219 Gastro-esophageal reflux disease without esophagitis: Secondary | ICD-10-CM | POA: Diagnosis present

## 2020-10-30 DIAGNOSIS — Z8572 Personal history of non-Hodgkin lymphomas: Secondary | ICD-10-CM | POA: Diagnosis not present

## 2020-10-30 DIAGNOSIS — N179 Acute kidney failure, unspecified: Secondary | ICD-10-CM | POA: Diagnosis not present

## 2020-10-30 DIAGNOSIS — Z87891 Personal history of nicotine dependence: Secondary | ICD-10-CM | POA: Diagnosis not present

## 2020-10-30 DIAGNOSIS — I5022 Chronic systolic (congestive) heart failure: Secondary | ICD-10-CM | POA: Diagnosis present

## 2020-10-30 DIAGNOSIS — Z66 Do not resuscitate: Secondary | ICD-10-CM | POA: Diagnosis present

## 2020-10-30 DIAGNOSIS — R739 Hyperglycemia, unspecified: Secondary | ICD-10-CM | POA: Diagnosis present

## 2020-10-30 DIAGNOSIS — C8330 Diffuse large B-cell lymphoma, unspecified site: Secondary | ICD-10-CM | POA: Diagnosis present

## 2020-10-30 DIAGNOSIS — Z91030 Bee allergy status: Secondary | ICD-10-CM | POA: Diagnosis not present

## 2020-10-30 HISTORY — DX: Acute kidney failure, unspecified: N17.9

## 2020-10-30 LAB — COMPREHENSIVE METABOLIC PANEL
ALT: 14 U/L (ref 0–44)
AST: 22 U/L (ref 15–41)
Albumin: 3.8 g/dL (ref 3.5–5.0)
Alkaline Phosphatase: 30 U/L — ABNORMAL LOW (ref 38–126)
Anion gap: 9 (ref 5–15)
BUN: 34 mg/dL — ABNORMAL HIGH (ref 8–23)
CO2: 22 mmol/L (ref 22–32)
Calcium: 9.4 mg/dL (ref 8.9–10.3)
Chloride: 107 mmol/L (ref 98–111)
Creatinine, Ser: 1.73 mg/dL — ABNORMAL HIGH (ref 0.44–1.00)
GFR, Estimated: 29 mL/min — ABNORMAL LOW (ref >60)
Glucose, Bld: 84 mg/dL (ref 70–99)
Potassium: 4.3 mmol/L (ref 3.5–5.1)
Sodium: 138 mmol/L (ref 135–145)
Total Bilirubin: 1.1 mg/dL (ref 0.3–1.2)
Total Protein: 6.7 g/dL (ref 6.5–8.1)

## 2020-10-30 LAB — CBC
HCT: 37.7 % (ref 36.0–46.0)
Hemoglobin: 12.5 g/dL (ref 12.0–15.0)
MCH: 34.7 pg — ABNORMAL HIGH (ref 26.0–34.0)
MCHC: 33.2 g/dL (ref 30.0–36.0)
MCV: 104.7 fL — ABNORMAL HIGH (ref 80.0–100.0)
Platelets: 203 10*3/uL (ref 150–400)
RBC: 3.6 MIL/uL — ABNORMAL LOW (ref 3.87–5.11)
RDW: 11.7 % (ref 11.5–15.5)
WBC: 5.2 10*3/uL (ref 4.0–10.5)
nRBC: 0 % (ref 0.0–0.2)

## 2020-10-30 LAB — MAGNESIUM: Magnesium: 2 mg/dL (ref 1.7–2.4)

## 2020-10-30 MED ORDER — METOPROLOL TARTRATE 5 MG/5ML IV SOLN: 5.0000 mg | INTRAVENOUS | Status: DC | PRN

## 2020-10-30 MED ORDER — SODIUM CHLORIDE 0.9 % IV SOLN: INTRAVENOUS | Status: DC

## 2020-10-30 MED ORDER — FUROSEMIDE 40 MG PO TABS
40.0000 mg | ORAL_TABLET | Freq: Every day | ORAL | Status: DC
  Administered 2020-10-30: 40 mg via ORAL
  Filled 2020-10-30: qty 1

## 2020-10-30 MED ORDER — SENNOSIDES-DOCUSATE SODIUM 8.6-50 MG PO TABS: 1.0000 | ORAL_TABLET | Freq: Every evening | ORAL | Status: DC | PRN

## 2020-10-30 MED ORDER — HYDRALAZINE HCL 20 MG/ML IJ SOLN: 10.0000 mg | INTRAMUSCULAR | Status: DC | PRN

## 2020-10-30 NOTE — H&P (Signed)
TRH H&P    Patient Demographics:    Verdell Basich, is a 85 y.o. female  MRN: [id number]  DOB - 20-Jul-1935  Admit Date - 10/29/2020  Referring MD/NP/PA: [facility]  Outpatient Primary MD for the patient is [name], MD  Patient coming from: Home  Chief complaint- hyper K   HPI:    Yakia Laclaire  is a 85 y.o. female, with history of non-Hodgkin lymphoma, hyperlipidemia, GERD, heart failure with reduced ejection fraction, nonischemic cardiomyopathy, and more presents the ED with a chief complaint of abnormal lab.  Patient reports that she went for a routine visit at her doctor's office and they drew labs.  Last night they called her and told her she must come to the ER for hyperkalemia.  She went to the hospital in [place], and it was too busy so she went home.  She talked to her PCP again the next day and he advised her to come to the ER so she came to the [facility].  Labs are done that again demonstrate hyperkalemia.  Patient reports that she has had no chest pains, palpitations, muscle cramps, orthopnea.  Patient denies shortness of breath but reports that her daughter has noticed that she has had some shortness of breath when walking to her daughter's house.  She reports that she lives directly behind her daughter is approximately 200 steps to the daughter's house.  Patient has not thought anything of her shortness of breath other than it is her age.  Patient reports no known history of renal insufficiency.  She reports she has not had any change in urine output.  She did have a left nephrolithotomy in the past, but has never had complications from that.  At home she is on 30 mEq of potassium daily, Entresto, Aldactone.  She also takes Lasix 80 mg in the morning and 40 mg in the evening.  Patient reports that she has been completely in her normal state of health other than the past couple of days  she has felt like her legs are a bit stiffer when she tries to get up.  She does has to stretch them a bit and she is back to her normal baseline per her report.  Patient is quite healthy and active for her age.  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  Patient has no other complaints at this time.  Patient does not smoke, she does drink a rum and coke every 3 days.  She is vaccinated for COVID.  Patient is DNR.  In the ED Temp 97.8, heart rate 65-1 20, respiratory rate 16-25, blood pressure 119/76, satting 100% on room air No leukocytosis, hemoglobin 14.2 Chemistry panel reveals an elevated creatinine at 2.5, baseline 1.85, potassium is initially 6.7, repeat 6.2 Calcium gluconate given, Lokelma given -at the time of my exam no bowel movement thus far EKG did not have acute changes Admission requested for further management of hyperglycemia and AKI    Review of systems:    In addition to the  HPI above,  No Fever-chills, No Headache, No changes with Vision or hearing, No problems swallowing food or Liquids, No Chest pain, Cough or Shortness of Breath, No Abdominal pain, No Nausea or Vomiting, bowel movements are regular, No Blood in stool or Urine, No dysuria, No new skin rashes or bruises,  No new weakness, tingling, numbness in any extremity, No recent weight gain or loss, No polyuria, polydypsia or polyphagia, No significant Mental Stressors.  All other systems reviewed and are negative.    Past History of the following :    Past Medical History:  Diagnosis Date   Anaphylaxis due to hymenoptera venom 10/30/2014   BMI 35.0-35.9,adult 02/10/2016   Cancer ([place])    Stage 4 Non-Hodgkin Lymphoma, clear for the past year   Cataract    Diffuse large B cell lymphoma ([place]) 04/09/2020   Dyslipidemia 10/19/2019   Dyspnea on exertion 10/30/2015   GERD (gastroesophageal reflux disease)    HFrEF (heart failure with reduced ejection fraction) ([place]) 11/25/2017   Hymenoptera allergy    Left  hip pain 02/10/2016   Lymphadenopathy, inguinal 02/10/2016   Nonischemic cardiomyopathy ([place]) 11/25/2017   Overgrown toenails 09/21/2019   Palpitations 10/30/2015   Postoperative examination 02/17/2016   Swelling    Tachycardia 10/30/2015      Past Surgical History:  Procedure Laterality Date   ABDOMINAL HYSTERECTOMY     ADENOIDECTOMY     APPENDECTOMY     CHOLECYSTECTOMY     EYE SURGERY     NEPHROLITHOTOMY Left    TONSILLECTOMY        Social History:      Social History   Tobacco Use   Smoking status: Former    Types: Cigarettes    Quit date: 08/24/1993    Years since quitting: 27.2   Smokeless tobacco: Never  Substance Use Topics   Alcohol use: Yes    Comment: occasional       Family History :     Family History  Problem Relation Age of Onset   Rheum arthritis Mother    Osteoarthritis Mother    Other Father        Asbestosis   Cancer Brother       Home Medications:   Prior to Admission medications   Medication Sig Start Date End Date Taking? Authorizing Provider  carvedilol (COREG) 6.25 MG tablet TAKE 1 TABLET(6.25 MG) BY MOUTH TWICE DAILY Patient taking differently: Take 6.25 mg by mouth 2 (two) times daily with a meal. TAKE 1 TABLET(6.25 MG) BY MOUTH TWICE DAILY 07/14/20   Park Liter, MD  cyclobenzaprine (FLEXERIL) 10 MG tablet Take 1 tablet (10 mg total) by mouth 3 (three) times daily as needed for muscle spasms. 04/28/20   Dayton Scrape A, NP  EPINEPHrine 0.3 mg/0.3 mL IJ SOAJ injection Inject 0.3 mg into the muscle as needed for anaphylaxis.    [provider]  furosemide (LASIX) 40 MG tablet Take 80 mg (2 tablets) in the morning and 40 mg (1 tablet) in the evening daily. Patient taking differently: Take 120 mg by mouth 2 (two) times daily. Take 80 mg (2 tablets) in the morning and 40 mg (1 tablet) in the evening daily. 06/16/20   Park Liter, MD  Multiple Vitamin (MULTIVITAMIN) capsule Take 1 capsule by mouth daily. Unknown  strength    [provider]  potassium chloride SA (KLOR-CON) 20 MEQ tablet Take 2 tablets (40 mEq total) by mouth daily. 10/21/20   Park Liter, MD  sacubitril-valsartan (ENTRESTO) 49-51 MG Take 1 tablet by mouth 2 (two) times daily. Patient taking differently: Take 0.5 tablets by mouth 2 (two) times daily. 08/27/20   Park Liter, MD  spironolactone (ALDACTONE) 25 MG tablet Take 0.5 tablets (12.5 mg total) by mouth daily. 10/21/20   Park Liter, MD     Allergies:     Allergies  Allergen Reactions   Ivp Dye [Iodinated Diagnostic Agents] Other (See Comments)    CARDIAC ARREST   Mixed Vespid Venom Anaphylaxis   Wasp Venom Anaphylaxis   Bee Venom Other (See Comments)    Mixed vespids Mixed vespids  Mixed vespids Mixed vespids   Furadantin [Nitrofurantoin] Hives and Nausea Only   Ibuprofen Hives   Iodine    Iodine-131 Other (See Comments)    Unknown   Magnesium Citrate Hives   Penicillamine Other (See Comments)    Unknown   Penicillins    Sulfa Antibiotics    Sulfasalazine Other (See Comments)     Physical Exam:   Vitals  Blood pressure (!) 102/54, pulse (!) 107, temperature 97.9 F (36.6 C), resp. rate 20, height '5\' 1"'$  (1.549 m), weight 74.8 kg, SpO2 99 %.  1.  General: Patient lying supine in bed,  no acute distress   2. Psychiatric: Alert and oriented x 3, mood and behavior normal for situation, pleasant and cooperative with exam   3. Neurologic: Speech and language are normal, face is symmetric, moves all 4 extremities voluntarily, at baseline without acute deficits on limited exam   4. HEENMT:  Head is atraumatic, normocephalic, pupils reactive to light, neck is supple, trachea is midline, mucous membranes are moist   5. Respiratory : Lungs are clear to auscultation bilaterally without wheezing, rhonchi, rales, no cyanosis, no increase in work of breathing or accessory muscle use   6. Cardiovascular : Heart rate normal, rhythm  is regular, no murmurs, rubs or gallops, no peripheral edema, peripheral pulses palpated   7. Gastrointestinal:  Abdomen is soft, nondistended, nontender to palpation bowel sounds active, no masses or organomegaly palpated   8. Skin:  Skin is warm, dry and intact without rashes, acute lesions, or ulcers on limited exam   9.Musculoskeletal:  No acute deformities or trauma, no asymmetry in tone, no peripheral edema, peripheral pulses palpated, no tenderness to palpation in the extremities     Data Review:    CBC Recent Labs  Lab 10/29/20 1234  WBC 6.5  HGB 14.2  HCT 43.0  PLT 235  MCV 105.9*  MCH 35.0*  MCHC 33.0  RDW 11.6  LYMPHSABS 0.8  MONOABS 0.5  EOSABS 0.1  BASOSABS 0.1   ------------------------------------------------------------------------------------------------------------------  Results for orders placed or performed during the hospital encounter of 10/29/20 (from the past 48 hour(s))  CBC with Differential     Status: Abnormal   Collection Time: 10/29/20 12:34 PM  Result Value Ref Range   WBC 6.5 4.0 - 10.5 K/uL   RBC 4.06 3.87 - 5.11 MIL/uL   Hemoglobin 14.2 12.0 - 15.0 g/dL   HCT 43.0 36.0 - 46.0 %   MCV 105.9 (H) 80.0 - 100.0 fL   MCH 35.0 (H) 26.0 - 34.0 pg   MCHC 33.0 30.0 - 36.0 g/dL   RDW 11.6 11.5 - 15.5 %   Platelets 235 150 - 400 K/uL   nRBC 0.0 0.0 - 0.2 %   Neutrophils Relative % 77 %   Neutro Abs 5.0 1.7 - 7.7 K/uL   Lymphocytes Relative 12 %  Lymphs Abs 0.8 0.7 - 4.0 K/uL   Monocytes Relative 8 %   Monocytes Absolute 0.5 0.1 - 1.0 K/uL   Eosinophils Relative 1 %   Eosinophils Absolute 0.1 0.0 - 0.5 K/uL   Basophils Relative 1 %   Basophils Absolute 0.1 0.0 - 0.1 K/uL   Immature Granulocytes 1 %   Abs Immature Granulocytes 0.03 0.00 - 0.07 K/uL    Comment: Performed at [facility], [address] [place], [place] [address]  Comprehensive metabolic panel     Status: Abnormal   Collection Time: 10/29/20 12:34 PM   Result Value Ref Range   Sodium 135 135 - 145 mmol/L   Potassium 6.2 (H) 3.5 - 5.1 mmol/L   Chloride 102 98 - 111 mmol/L   CO2 21 (L) 22 - 32 mmol/L   Glucose, Bld 89 70 - 99 mg/dL    Comment: Glucose reference range applies only to samples taken after fasting for at least 8 hours.   BUN 38 (H) 8 - 23 mg/dL   Creatinine, Ser 2.51 (H) 0.44 - 1.00 mg/dL   Calcium 9.5 8.9 - 10.3 mg/dL   Total Protein 7.9 6.5 - 8.1 g/dL   Albumin 4.6 3.5 - 5.0 g/dL   AST 21 15 - 41 U/L   ALT 12 0 - 44 U/L   Alkaline Phosphatase 36 (L) 38 - 126 U/L   Total Bilirubin 0.8 0.3 - 1.2 mg/dL   GFR, Estimated 19 (L) >60 mL/min    Comment: (NOTE) Calculated using the CKD-EPI Creatinine Equation (2021)    Anion gap 12 5 - 15    Comment: Performed at [facility], [address] [place], [place] [address]  Magnesium     Status: None   Collection Time: 10/29/20 12:34 PM  Result Value Ref Range   Magnesium 2.0 1.7 - 2.4 mg/dL    Comment: Performed at [facility], [address] [place], [place] [address]  Resp Panel by RT-PCR (Flu A&B, Covid) Nasopharyngeal Swab     Status: None   Collection Time: 10/29/20  2:25 PM   Specimen: Nasopharyngeal Swab; Nasopharyngeal(NP) swabs in vial transport medium  Result Value Ref Range   SARS Coronavirus 2 by RT PCR NEGATIVE NEGATIVE    Comment: (NOTE) SARS-CoV-2 target nucleic acids are NOT DETECTED.  The SARS-CoV-2 RNA is generally detectable in upper respiratory specimens during the acute phase of infection. The lowest concentration of SARS-CoV-2 viral copies this assay can detect is 138 copies/mL. A negative result does not preclude SARS-Cov-2 infection and should not be used as the sole basis for treatment or other patient management decisions. A negative result may occur with  improper specimen collection/handling, submission of specimen other than nasopharyngeal swab, presence of viral mutation(s) within the areas targeted by this assay,  and inadequate number of viral copies(<138 copies/mL). A negative result must be combined with clinical observations, patient history, and epidemiological information. The expected result is Negative.  Fact Sheet for Patients:  EntrepreneurPulse.com.au  Fact Sheet for Healthcare Providers:  IncredibleEmployment.be  This test is no t yet approved or cleared by the [place] FDA and  has been authorized for detection and/or diagnosis of SARS-CoV-2 by FDA under an Emergency Use Authorization (EUA). This EUA will remain  in effect (meaning this test can be used) for the duration of the COVID-19 declaration under Section 564(b)(1) of the Act, 21 U.S.C.section 360bbb-3(b)(1), unless the authorization is terminated  or  revoked sooner.       Influenza A by PCR NEGATIVE NEGATIVE   Influenza B by PCR NEGATIVE NEGATIVE    Comment: (NOTE) The Xpert Xpress SARS-CoV-2/FLU/RSV plus assay is intended as an aid in the diagnosis of influenza from Nasopharyngeal swab specimens and should not be used as a sole basis for treatment. Nasal washings and aspirates are unacceptable for Xpert Xpress SARS-CoV-2/FLU/RSV testing.  Fact Sheet for Patients: EntrepreneurPulse.com.au  Fact Sheet for Healthcare Providers: IncredibleEmployment.be  This test is not yet approved or cleared by the [place] FDA and has been authorized for detection and/or diagnosis of SARS-CoV-2 by FDA under an Emergency Use Authorization (EUA). This EUA will remain in effect (meaning this test can be used) for the duration of the COVID-19 declaration under Section 564(b)(1) of the Act, 21 U.S.C. section 360bbb-3(b)(1), unless the authorization is terminated or revoked.  Performed at [facility], [address] [place], [place] [id number]   Basic metabolic panel     Status: Abnormal   Collection Time: 10/29/20  9:22 PM  Result Value  Ref Range   Sodium 143 135 - 145 mmol/L   Potassium 5.2 (H) 3.5 - 5.1 mmol/L   Chloride 107 98 - 111 mmol/L   CO2 25 22 - 32 mmol/L   Glucose, Bld 96 70 - 99 mg/dL    Comment: Glucose reference range applies only to samples taken after fasting for at least 8 hours.   BUN 40 (H) 8 - 23 mg/dL   Creatinine, Ser 2.13 (H) 0.44 - 1.00 mg/dL   Calcium 10.5 (H) 8.9 - 10.3 mg/dL   GFR, Estimated 23 (L) >60 mL/min    Comment: (NOTE) Calculated using the CKD-EPI Creatinine Equation (2021)    Anion gap 11 5 - 15    Comment: Performed at [facility], [address] [place], [place] [address]    Chemistries  Recent Labs  Lab 10/28/20 1041 10/29/20 1234 10/29/20 2122  NA 138 135 143  K 6.7* 6.2* 5.2*  CL 102 102 107  CO2 19* 21* 25  GLUCOSE 93 89 96  BUN 28* 38* 40*  CREATININE 1.85* 2.51* 2.13*  CALCIUM 10.2 9.5 10.5*  MG  --  2.0  --   AST  --  21  --   ALT  --  12  --   ALKPHOS  --  36*  --   BILITOT  --  0.8  --    ------------------------------------------------------------------------------------------------------------------  ------------------------------------------------------------------------------------------------------------------ GFR: Estimated Creatinine Clearance: 18.5 mL/min (A) (by C-G formula based on SCr of 2.13 mg/dL (H)). Liver Function Tests: Recent Labs  Lab 10/29/20 1234  AST 21  ALT 12  ALKPHOS 36*  BILITOT 0.8  PROT 7.9  ALBUMIN 4.6   No results for input(s): LIPASE, AMYLASE in the last 168 hours. No results for input(s): AMMONIA in the last 168 hours. Coagulation Profile: No results for input(s): INR, PROTIME in the last 168 hours. Cardiac Enzymes: No results for input(s): CKTOTAL, CKMB, CKMBINDEX, TROPONINI in the last 168 hours. BNP (last 3 results) Recent Labs    04/28/20 1200 05/05/20 1022  PROBNP 9,752* 3,442*   HbA1C: No results for input(s): HGBA1C in the last 72 hours. CBG: No results for input(s):  GLUCAP in the last 168 hours. Lipid Profile: No results for input(s): CHOL, HDL, LDLCALC, TRIG, CHOLHDL, LDLDIRECT in the last 72 hours. Thyroid Function Tests: No results for input(s): TSH, T4TOTAL, FREET4, T3FREE, THYROIDAB in the last 72  hours. Anemia Panel: No results for input(s): VITAMINB12, FOLATE, FERRITIN, TIBC, IRON, RETICCTPCT in the last 72 hours.  --------------------------------------------------------------------------------------------------------------- Urine analysis: No results found for: COLORURINE, APPEARANCEUR, LABSPEC, PHURINE, GLUCOSEU, HGBUR, BILIRUBINUR, KETONESUR, PROTEINUR, UROBILINOGEN, NITRITE, LEUKOCYTESUR    Imaging Results:    No results found.  My personal review of EKG: Rhythm NSR, Rate 97 /min, QTc 435 ,no Acute ST changes   Assessment & Plan:    Active Problems:   HFrEF (heart failure with reduced ejection fraction) (H[facility])   Hyperkalemia   AKI (acute kidney injury) ([place])   Hyperkalemia Potassium trending down from 6.7-5.2 Most likely secondary to the addition of Aldactone, and AKI 1 g calcium given in the ER [facility] Given in the ER as well as bicarb No fluid boluses given secondary to patient's heart failure Continued gentle IV fluids Hold Entresto, Aldactone which was recently started, and potassium supplementation, continue Lasix Monitor on telemetry AKI Creatinine 1 day ago was 1.85, today 2.51 Patient recently started on a new medication-Aldactone Hold Aldactone continue gentle IV fluid hydration Trend in the a.m. CHF Holding Entresto and Aldactone, continue Coreg Continue to monitor   DVT Prophylaxis-   Heparin- SCDs   AM Labs Ordered, also please review Full Orders  Family Communication: No family at bedside Code Status: DNR  Admission status: Observation    Time spent in minutes : Girard DO

## 2020-10-30 NOTE — Progress Notes (Signed)
Shake61 year old female with history of non-Hodgkin's lymphoma, HLD, GERD, systolic CHF [id number], nonischemic cardiomyopathy admitted for abnormal labs /Hyperkalemia.  Patient has been on Entresto for the past 2 months along with Lasix and potassium supplements.  Aldactone was added last week.  Her admission potassium was noted to be 6.7 and creatinine at 2.5 which is elevated from baseline of 1.1. In the ER patient received calcium, Lokelma, sodium bicarb.  Patient seen and examined at bedside, does not have any complaints.  She tells me that she was taking Entresto, Lasix, potassium and recently started Aldactone altogether.  Vital signs are stable. On physical exam she is not in acute distress, clear to auscultation bilaterally, normal sinus rhythm.  No lower extremity edema.  At this time we will plan on holding off on potassium supplements, Aldactone.  Monitor electrolytes and creatinine.  Hopefully tomorrow we can determine final regimen for her to go home. I discussed briefly with cardiology team who will help arrange for repeat lab work next week and follow-up with Dr. Agustin Cree.   Gerlean Ren MD [facility]

## 2020-10-30 NOTE — Progress Notes (Addendum)
Asked by primary team to arrange close f/u. No availability in [place] so follow-up arranged at [facility] location. Pt and daughter aware and agreeable for location. Appt info placed on AVS.

## 2020-10-31 ENCOUNTER — Other Ambulatory Visit (HOSPITAL_COMMUNITY): Payer: Self-pay

## 2020-10-31 DIAGNOSIS — E875 Hyperkalemia: Secondary | ICD-10-CM | POA: Diagnosis not present

## 2020-10-31 DIAGNOSIS — N179 Acute kidney failure, unspecified: Secondary | ICD-10-CM | POA: Diagnosis not present

## 2020-10-31 LAB — BASIC METABOLIC PANEL
Anion gap: 12 (ref 5–15)
BUN: 36 mg/dL — ABNORMAL HIGH (ref 8–23)
CO2: 22 mmol/L (ref 22–32)
Calcium: 9.3 mg/dL (ref 8.9–10.3)
Chloride: 101 mmol/L (ref 98–111)
Creatinine, Ser: 1.61 mg/dL — ABNORMAL HIGH (ref 0.44–1.00)
GFR, Estimated: 32 mL/min — ABNORMAL LOW (ref >60)
Glucose, Bld: 88 mg/dL (ref 70–99)
Potassium: 4 mmol/L (ref 3.5–5.1)
Sodium: 135 mmol/L (ref 135–145)

## 2020-10-31 LAB — MAGNESIUM: Magnesium: 1.7 mg/dL (ref 1.7–2.4)

## 2020-10-31 MED ORDER — POTASSIUM CHLORIDE CRYS ER 20 MEQ PO TBCR: 20.0000 meq | EXTENDED_RELEASE_TABLET | Freq: Every day | ORAL | 3 refills | Status: DC

## 2020-10-31 NOTE — Progress Notes (Signed)
Pt discharged to home. AVS gone over and patient understood. Pt's daughter taking her home.

## 2020-10-31 NOTE — Discharge Summary (Signed)
Physician Discharge Summary   Alexis Stephens [id number] DOB: 15-Sep-1935 DOA: 10/29/2020  PCP: [name], MD  Admit date: 10/29/2020 Discharge date: 10/31/2020   Admitted From: home Disposition:  home Discharging physician: Dwyane Dee, MD  Recommendations for Outpatient Follow-up:  Follow up with cardiology Repeat BMP, adjust KCL further as needed Pharmacy helped set patient up with grant for more affordable Entresto (see note)  Home Health:  Equipment/Devices:   Patient discharged to home in Discharge Condition: stable Risk of unplanned readmission score: Unplanned Admission- Pilot do not use: 16.68  CODE STATUS: DNR Diet recommendation:  Diet Orders (From admission, onward)     Start     Ordered   10/29/20 2313  Diet renal with fluid restriction Fluid restriction: 1800 mL Fluid; Room service appropriate? Yes; Fluid consistency: Thin  Diet effective now       Question Answer Comment  Fluid restriction: 1800 mL Fluid   Room service appropriate? Yes   Fluid consistency: Thin      10/29/20 2313            Hospital Course:  Alexis Stephens  is an 85 y.o. female with PMH sCHF, CKD3b, NHL, HLD, GERD who presented after having outpatient labs and found to have hyperkalemia.  She was referred to the ER for further treatment and work-up. She was asymptomatic specifically denying chest pain, palpitations, cramps, orthopnea.  She had initial lab check at [facility] ER but they were busy and she elected to go home and return the following morning. Initial K was 6.7. Home meds were reviewed and she is on Entresto, Aldactone (12.5 mg daily), Lasix (total of 120 mg daily), supplemental Kcl (60 mgEq daily---was recently reduced by Dr. Agustin Cree from 60 to 40 on 10/21/20, but does not appear patient decreased dose). She was treated with Lokelma, Lasix, IV fluids, and sodium bicarb on admission.  Potassium level responded and was 4 at time of  discharge.  She was resumed on her Entresto, Aldactone, and Lasix. Her KCL was decreased to 20 mEq daily and she has plans to follow up with cardiology after discharge for repeat labwork and further adjustment as needed. .  She mentioned that her Delene Loll was rather expensive and after discussion with pharmacy she does not have prescription drug coverage.  She was able to be enrolled in the patient access network foundation grant providing $1000 grant for ongoing prescription use. Would continue confirming if medications are affordable and any other options with CHF meds.  Renal function had also acutely worsened above baseline.  Normal creatinine appears to be around 1.3-1.5.  Creatinine on admission was 2.51 and she was started on fluids with good response.  Creatinine had improved to 1.61 at discharge.    The patient's chronic medical conditions were treated accordingly per the patient's home medication regimen except as noted.  On day of discharge, patient was felt deemed stable for discharge. Patient/family member advised to call PCP or come back to ER if needed.   Principal Diagnosis: Hyperkalemia  Discharge Diagnoses: Active Hospital Problems   Diagnosis Date Noted   AKI (acute kidney injury) ([place]) 10/30/2020    Priority: High   HFrEF (heart failure with reduced ejection fraction) ([place]) 11/25/2017    Resolved Hospital Problems   Diagnosis Date Noted Date Resolved   Hyperkalemia 10/29/2020 10/31/2020    Priority: High    Discharge Instructions     Increase activity slowly   Complete by: As directed  Allergies as of 10/31/2020       Reactions   Ivp Dye [iodinated Diagnostic Agents] Other (See Comments)   CARDIAC ARREST   Mixed Vespid Venom Anaphylaxis   Wasp Venom Anaphylaxis   Bee Venom Other (See Comments)   Mixed vespids Mixed vespids Mixed vespids Mixed vespids   Furadantin [nitrofurantoin] Hives, Nausea Only   Ibuprofen Hives   Iodine    Iodine-131 Other  (See Comments)   Unknown   Magnesium Citrate Hives   Penicillamine Other (See Comments)   Unknown   Penicillins    Sulfa Antibiotics    Sulfasalazine Other (See Comments)        Medication List     STOP taking these medications    carvedilol 6.25 MG tablet Commonly known as: COREG       TAKE these medications    acetaminophen 325 MG tablet Commonly known as: TYLENOL Take 650 mg by mouth every 6 (six) hours as needed for mild pain, fever or headache.   cyclobenzaprine 10 MG tablet Commonly known as: FLEXERIL Take 1 tablet (10 mg total) by mouth 3 (three) times daily as needed for muscle spasms.   Entresto 49-51 MG Generic drug: sacubitril-valsartan Take 1 tablet by mouth 2 (two) times daily. What changed: how much to take   EPINEPHrine 0.3 mg/0.3 mL Soaj injection Commonly known as: EPI-PEN Inject 0.3 mg into the muscle as needed for anaphylaxis.   furosemide 40 MG tablet Commonly known as: LASIX Take 80 mg (2 tablets) in the morning and 40 mg (1 tablet) in the evening daily. What changed:  how much to take how to take this when to take this   multivitamin capsule Take 1 capsule by mouth daily. Unknown strength   potassium chloride SA 20 MEQ tablet Commonly known as: KLOR-CON Take 1 tablet (20 mEq total) by mouth daily. What changed: how much to take   spironolactone 25 MG tablet Commonly known as: Aldactone Take 0.5 tablets (12.5 mg total) by mouth daily.        Follow-up [facility] Follow up.   Specialty: Cardiology Why: PLEASE NOTE LOCATION!!! We have arranged a close follow-up appointment with the cardiology team at the [place] location (off [address]) on Thursday Nov 06, 2020 at 10:00 AM (Arrive by 9:45 AM). This is with Urban Gibson, one of our nurse practitioners that works closely with Dr. Agustin Cree. There were no appointments available at the usual [place] location so we made  arrangements to see you at this location. Contact information: [place] [phone]               Allergies  Allergen Reactions   Ivp Dye [Iodinated Diagnostic Agents] Other (See Comments)    CARDIAC ARREST   Mixed Vespid Venom Anaphylaxis   Wasp Venom Anaphylaxis   Bee Venom Other (See Comments)    Mixed vespids Mixed vespids  Mixed vespids Mixed vespids   Furadantin [Nitrofurantoin] Hives and Nausea Only   Ibuprofen Hives   Iodine    Iodine-131 Other (See Comments)    Unknown   Magnesium Citrate Hives   Penicillamine Other (See Comments)    Unknown   Penicillins    Sulfa Antibiotics    Sulfasalazine Other (See Comments)    Consultations:   Discharge Exam: BP 110/67 (BP Location: Right Arm)   Pulse (!) 101   Temp 97.9 F (36.6 C) (Oral)   Resp  20   Ht '5\' 1"'$  (1.549 m)   Wt 80.6 kg   SpO2 95%   BMI 33.57 kg/m  General appearance: alert, cooperative, and no distress Head: Normocephalic, without obvious abnormality, atraumatic Eyes:  EOMI Lungs:  mild crackles bibasilar, no wheezing Heart: regular rate and rhythm and S1, S2 normal Abdomen: normal findings: bowel sounds normal and soft, non-tender Extremities:  no edema Skin: mobility and turgor normal Neurologic: Grossly normal  The results of significant diagnostics from this hospitalization (including imaging, microbiology, ancillary and laboratory) are listed below for reference.   Microbiology: Recent Results (from the past 240 hour(s))  Resp Panel by RT-PCR (Flu A&B, Covid) Nasopharyngeal Swab     Status: None   Collection Time: 10/29/20  2:25 PM   Specimen: Nasopharyngeal Swab; Nasopharyngeal(NP) swabs in vial transport medium  Result Value Ref Range Status   SARS Coronavirus 2 by RT PCR NEGATIVE NEGATIVE Final    Comment: (NOTE) SARS-CoV-2 target nucleic acids are NOT DETECTED.  The SARS-CoV-2 RNA is generally detectable in  upper respiratory specimens during the acute phase of infection. The lowest concentration of SARS-CoV-2 viral copies this assay can detect is 138 copies/mL. A negative result does not preclude SARS-Cov-2 infection and should not be used as the sole basis for treatment or other patient management decisions. A negative result may occur with  improper specimen collection/handling, submission of specimen other than nasopharyngeal swab, presence of viral mutation(s) within the areas targeted by this assay, and inadequate number of viral copies(<138 copies/mL). A negative result must be combined with clinical observations, patient history, and epidemiological information. The expected result is Negative.  Fact Sheet for Patients:  EntrepreneurPulse.com.au  Fact Sheet for Healthcare Providers:  IncredibleEmployment.be  This test is no t yet approved or cleared by the [place] FDA and  has been authorized for detection and/or diagnosis of SARS-CoV-2 by FDA under an Emergency Use Authorization (EUA). This EUA will remain  in effect (meaning this test can be used) for the duration of the COVID-19 declaration under Section 564(b)(1) of the Act, 21 U.S.C.section 360bbb-3(b)(1), unless the authorization is terminated  or revoked sooner.       Influenza A by PCR NEGATIVE NEGATIVE Final   Influenza B by PCR NEGATIVE NEGATIVE Final    Comment: (NOTE) The Xpert Xpress SARS-CoV-2/FLU/RSV plus assay is intended as an aid in the diagnosis of influenza from Nasopharyngeal swab specimens and should not be used as a sole basis for treatment. Nasal washings and aspirates are unacceptable for Xpert Xpress SARS-CoV-2/FLU/RSV testing.  Fact Sheet for Patients: EntrepreneurPulse.com.au  Fact Sheet for Healthcare Providers: IncredibleEmployment.be  This test is not yet approved or cleared by the [place] FDA and has been  authorized for detection and/or diagnosis of SARS-CoV-2 by FDA under an Emergency Use Authorization (EUA). This EUA will remain in effect (meaning this test can be used) for the duration of the COVID-19 declaration under Section 564(b)(1) of the Act, 21 U.S.C. section 360bbb-3(b)(1), unless the authorization is terminated or revoked.  Performed at [facility], [address] [place], [place] [address]      Labs: BNP (last 3 results) No results for input(s): BNP in the last 8760 hours. Basic Metabolic Panel: Recent Labs  Lab 10/28/20 1041 10/29/20 1234 10/29/20 2122 10/30/20 0524 10/31/20 0530  NA 138 135 143 138 135  K 6.7* 6.2* 5.2* 4.3 4.0  CL 102 102 107 107 101  CO2 19* 21* '25 22 22  '$ GLUCOSE  93 89 96 84 88  BUN 28* 38* 40* 34* 36*  CREATININE 1.85* 2.51* 2.13* 1.73* 1.61*  CALCIUM 10.2 9.5 10.5* 9.4 9.3  MG  --  2.0  --  2.0 1.7   Liver Function Tests: Recent Labs  Lab 10/29/20 1234 10/30/20 0524  AST 21 22  ALT 12 14  ALKPHOS 36* 30*  BILITOT 0.8 1.1  PROT 7.9 6.7  ALBUMIN 4.6 3.8   No results for input(s): LIPASE, AMYLASE in the last 168 hours. No results for input(s): AMMONIA in the last 168 hours. CBC: Recent Labs  Lab 10/29/20 1234 10/30/20 0524  WBC 6.5 5.2  NEUTROABS 5.0  --   HGB 14.2 12.5  HCT 43.0 37.7  MCV 105.9* 104.7*  PLT 235 203   Cardiac Enzymes: No results for input(s): CKTOTAL, CKMB, CKMBINDEX, TROPONINI in the last 168 hours. BNP: Invalid input(s): POCBNP CBG: No results for input(s): GLUCAP in the last 168 hours. D-Dimer No results for input(s): DDIMER in the last 72 hours. Hgb A1c No results for input(s): HGBA1C in the last 72 hours. Lipid Profile No results for input(s): CHOL, HDL, LDLCALC, TRIG, CHOLHDL, LDLDIRECT in the last 72 hours. Thyroid function studies No results for input(s): TSH, T4TOTAL, T3FREE, THYROIDAB in the last 72 hours.  Invalid input(s): FREET3 Anemia work up No results for input(s):  VITAMINB12, FOLATE, FERRITIN, TIBC, IRON, RETICCTPCT in the last 72 hours. Urinalysis No results found for: COLORURINE, APPEARANCEUR, [place], [place], GLUCOSEU, [place], [place], [place], PROTEINUR, UROBILINOGEN, NITRITE, LEUKOCYTESUR Sepsis Labs Invalid input(s): PROCALCITONIN,  WBC,  LACTICIDVEN Microbiology Recent Results (from the past 240 hour(s))  Resp Panel by RT-PCR (Flu A&B, Covid) Nasopharyngeal Swab     Status: None   Collection Time: 10/29/20  2:25 PM   Specimen: Nasopharyngeal Swab; Nasopharyngeal(NP) swabs in vial transport medium  Result Value Ref Range Status   SARS Coronavirus 2 by RT PCR NEGATIVE NEGATIVE Final    Comment: (NOTE) SARS-CoV-2 target nucleic acids are NOT DETECTED.  The SARS-CoV-2 RNA is generally detectable in upper respiratory specimens during the acute phase of infection. The lowest concentration of SARS-CoV-2 viral copies this assay can detect is 138 copies/mL. A negative result does not preclude SARS-Cov-2 infection and should not be used as the sole basis for treatment or other patient management decisions. A negative result may occur with  improper specimen collection/handling, submission of specimen other than nasopharyngeal swab, presence of viral mutation(s) within the areas targeted by this assay, and inadequate number of viral copies(<138 copies/mL). A negative result must be combined with clinical observations, patient history, and epidemiological information. The expected result is Negative.  Fact Sheet for Patients:  EntrepreneurPulse.com.au  Fact Sheet for Healthcare Providers:  IncredibleEmployment.be  This test is no t yet approved or cleared by the [place] FDA and  has been authorized for detection and/or diagnosis of SARS-CoV-2 by FDA under an Emergency Use Authorization (EUA). This EUA will remain  in effect (meaning this test can be used) for the duration of the COVID-19  declaration under Section 564(b)(1) of the Act, 21 U.S.C.section 360bbb-3(b)(1), unless the authorization is terminated  or revoked sooner.       Influenza A by PCR NEGATIVE NEGATIVE Final   Influenza B by PCR NEGATIVE NEGATIVE Final    Comment: (NOTE) The Xpert Xpress SARS-CoV-2/FLU/RSV plus assay is intended as an aid in the diagnosis of influenza from Nasopharyngeal swab specimens and should not be used as a sole basis for treatment. Nasal washings and aspirates  are unacceptable for Xpert Xpress SARS-CoV-2/FLU/RSV testing.  Fact Sheet for Patients: EntrepreneurPulse.com.au  Fact Sheet for Healthcare Providers: IncredibleEmployment.be  This test is not yet approved or cleared by the [place] FDA and has been authorized for detection and/or diagnosis of SARS-CoV-2 by FDA under an Emergency Use Authorization (EUA). This EUA will remain in effect (meaning this test can be used) for the duration of the COVID-19 declaration under Section 564(b)(1) of the Act, 21 U.S.C. section 360bbb-3(b)(1), unless the authorization is terminated or revoked.  Performed at [facility], [address] [place], [place] [address]     Procedures/Studies: No results found.   Time coordinating discharge: Over 30 minutes    Dwyane Dee, MD  [facility] 10/31/2020, 1:43 PM

## 2020-10-31 NOTE — TOC Benefit Eligibility Note (Signed)
Patient Advocate Encounter   I was successful in securing patient an $1,000.00 grant from Patient [employer] [facility]) to provide copayment coverage for Heart Failure medications.  This will make the out of pocket cost $0.00.      The billing information is as follows and has been shared with Patient and placed in [place].   Member ID: [id number] Group ID: [id number] RxBin: [id number] Dates of Eligibility: 10/31/2020 through 10/30/2021    Lyndel Safe, [place] Patient Advocate Specialist [place] Antimicrobial Stewardship Team Direct Number: [phone]  Fax: [phone]

## 2020-10-31 NOTE — TOC Benefit Eligibility Note (Signed)
Patient Teacher, English as a foreign language completed.    The patient has no pharmacy prescription coverage.  Lyndel Safe, [place] Patient Advocate Specialist [place] Antimicrobial Stewardship Team Direct Number: [phone]  Fax: [phone]

## 2020-11-06 ENCOUNTER — Encounter (HOSPITAL_BASED_OUTPATIENT_CLINIC_OR_DEPARTMENT_OTHER): Payer: Self-pay | Admitting: Family

## 2020-11-06 ENCOUNTER — Other Ambulatory Visit: Payer: Self-pay

## 2020-11-06 ENCOUNTER — Ambulatory Visit (INDEPENDENT_AMBULATORY_CARE_PROVIDER_SITE_OTHER): Payer: Medicare Other | Admitting: Family

## 2020-11-06 ENCOUNTER — Encounter (HOSPITAL_BASED_OUTPATIENT_CLINIC_OR_DEPARTMENT_OTHER): Payer: Self-pay

## 2020-11-06 VITALS — BP 110/70 | HR 70 | Ht 61.0 in | Wt 165.0 lb

## 2020-11-06 DIAGNOSIS — I5022 Chronic systolic (congestive) heart failure: Secondary | ICD-10-CM

## 2020-11-06 DIAGNOSIS — E875 Hyperkalemia: Secondary | ICD-10-CM

## 2020-11-06 DIAGNOSIS — I428 Other cardiomyopathies: Secondary | ICD-10-CM

## 2020-11-06 MED ORDER — ENTRESTO 49-51 MG PO TABS: 0.5000 | ORAL_TABLET | Freq: Two times a day (BID) | ORAL | 3 refills | Status: DC

## 2020-11-06 NOTE — Patient Instructions (Addendum)
Medication Instructions:  Your physician has recommended you make the following change in your medication:   CHANGE your Entresto to half tablet twice per day  Wait to resume Spironolactone 12.mg daily tomorrow pending lab results   *If you need a refill on your cardiac medications before your next appointment, please call your pharmacy*   Lab Work: Your physician recommends that you return for lab work today for BMP, magnesium, BNP  If you have labs (blood work) drawn today and your tests are completely normal, you will receive your results only by: [place] (if you have MyChart) OR A paper copy in the mail If you have any lab test that is abnormal or we need to change your treatment, we will call you to review the results.   Testing/Procedures: None ordered today.   Follow-Up: At [facility], you and your health needs are our priority.  As part of our continuing mission to provide you with exceptional heart care, we have created designated Provider Care Teams.  These Care Teams include your primary Cardiologist (physician) and Advanced Practice Providers (APPs -  Physician Assistants and Nurse Practitioners) who all work together to provide you with the care you need, when you need it.  We recommend signing up for the patient portal called "MyChart".  Sign up information is provided on this After Visit Summary.  MyChart is used to connect with patients for Virtual Visits (Telemedicine).  Patients are able to view lab/test results, encounter notes, upcoming appointments, etc.  Non-urgent messages can be sent to your provider as well.   To learn more about what you can do with MyChart, go to [email or web address].    Your next appointment:   As scheduled with Dr. Agustin Cree   Other Instructions  Recommend weighing daily and keeping a log. Please call our office if you have weight gain of 2 pounds overnight or 5 pounds in 1 week.   Date  Time Weight                                             Heart Healthy Diet Recommendations: A low-salt diet is recommended. Meats should be grilled, baked, or boiled. Avoid fried foods. Focus on lean protein sources like fish or chicken with vegetables and fruits. The American Heart Association is a Microbiologist!  Exercise recommendations: The American Heart Association recommends 150 minutes of moderate intensity exercise weekly. Try 30 minutes of moderate intensity exercise 4-5 times per week. This could include walking, jogging, or swimming.

## 2020-11-06 NOTE — Progress Notes (Signed)
Office Visit    Patient Name: Alexis Stephens Date of Encounter: 11/06/2020  PCP:  [name], MD   [employer]  Cardiologist:  [name], MD  Advanced Practice Provider:  No care team member to display Electrophysiologist:  None     Chief Complaint    Alexis Stephens is a 85 y.o. female with a hx of nonischemic cardiomyopathy, heart failure with reduced ejection fraction, non-Hodgkin's lymphoma, dyslipidemia presents today for hospital follow up.    Past Medical History    Past Medical History:  Diagnosis Date   Anaphylaxis due to hymenoptera venom 10/30/2014   BMI 35.0-35.9,adult 02/10/2016   Cancer ([place])    Stage 4 Non-Hodgkin Lymphoma, clear for the past year   Cataract    Diffuse large B cell lymphoma ([place]) 04/09/2020   Dyslipidemia 10/19/2019   Dyspnea on exertion 10/30/2015   GERD (gastroesophageal reflux disease)    HFrEF (heart failure with reduced ejection fraction) ([place]) 11/25/2017   Hymenoptera allergy    Left hip pain 02/10/2016   Lymphadenopathy, inguinal 02/10/2016   Nonischemic cardiomyopathy ([place]) 11/25/2017   Overgrown toenails 09/21/2019   Palpitations 10/30/2015   Postoperative examination 02/17/2016   Swelling    Tachycardia 10/30/2015   Past Surgical History:  Procedure Laterality Date   ABDOMINAL HYSTERECTOMY     ADENOIDECTOMY     APPENDECTOMY     CHOLECYSTECTOMY     EYE SURGERY     NEPHROLITHOTOMY Left    TONSILLECTOMY      Allergies  Allergies  Allergen Reactions   Ivp Dye [Iodinated Diagnostic Agents] Other (See Comments)    CARDIAC ARREST   Mixed Vespid Venom Anaphylaxis   Wasp Venom Anaphylaxis   Bee Venom Other (See Comments)    Mixed vespids Mixed vespids  Mixed vespids Mixed vespids   Furadantin [Nitrofurantoin] Hives and Nausea Only   Ibuprofen Hives   Iodine    Iodine-131 Other (See Comments)    Unknown   Magnesium Citrate Hives   Penicillamine Other (See  Comments)    Unknown   Penicillins    Sulfa Antibiotics    Sulfasalazine Other (See Comments)    History of Present Illness    Alexis Stephens is a 85 y.o. female with a hx of nonischemic cardiomyopathy, heart failure with reduced ejection fraction, non-Hodgkin's lymphoma, dyslipidemia last seen 10/21/2020 by Dr. Agustin Cree  At most recent clinic visit 10/18/2020 she noted shortness of breath, fatigue, lower extremity edema.  Did note that she was not taking diuretic regularly due to of urinary frequency.  She was not taking Entresto daily as she was worried about cost.  Hospitalized 10/29/2020 - 10/31/2020 for hyperkalemia.  She was treated with Lokelma, Lasix, IV fluids, sodium bicarb.  Her Entresto, Aldactone and Lasix were able to be resumed.  Potassium chloride was decreased to 20 mill equivalent daily.  Pharmacy was able to get her a grant to help make Entresto more affordable.  She presents today for follow-up with her daughter.  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  Has not yet resumed Spironolactone since hospital discharge. She is taking a half tablet of her Entresto once per day. We discussed the importance of BID dosing. Prior to admission she was participating in cardiac rehab at [facility] and needs a note to resume.  She really noticed an improvement with her exercise tolerance with cardiac rehab as well as spironolactone.  Her lower extremity edema  is much improved.  EKGs/Labs/Other Studies Reviewed:   The following studies were reviewed today:  Echo 05/28/2020  1. Left ventricular ejection fraction, by estimation, is 30 to 35%. The  left ventricle has moderately decreased function.   2. On view 15 and 16, echodensity seen in right heart/RVOT of unclear  clinical significance. Clinical correlation suggested. May need CT/CRI if  appropriate for further assessment. This could be artifactual.   3. Left atrial size was moderately dilated.   4. The mitral  valve is normal in structure. Moderate mitral valve  regurgitation. No evidence of mitral stenosis.   5. The aortic valve is normal in structure. Aortic valve regurgitation is  moderate to severe. No aortic stenosis is present.   6. The inferior vena cava is normal in size with greater than 50%  respiratory variability, suggesting right atrial pressure of 3 mmHg.   EKG: No EKG today  Recent Labs: 05/05/2020: NT-Pro BNP 3,442 10/30/2020: ALT 14; Hemoglobin 12.5; Platelets 203 10/31/2020: BUN 36; Creatinine, Ser 1.61; Magnesium 1.7; Potassium 4.0; Sodium 135  Recent Lipid Panel    Component Value Date/Time   CHOL 195 06/16/2020 1501   TRIG 153 (H) 06/16/2020 1501   HDL 44 06/16/2020 1501   CHOLHDL 4.4 06/16/2020 1501   LDLCALC 124 (H) 06/16/2020 1501   Home Medications   Current Meds  Medication Sig   acetaminophen (TYLENOL) 325 MG tablet Take 650 mg by mouth every 6 (six) hours as needed for mild pain, fever or headache.   cyclobenzaprine (FLEXERIL) 10 MG tablet Take 1 tablet (10 mg total) by mouth 3 (three) times daily as needed for muscle spasms.   EPINEPHrine 0.3 mg/0.3 mL IJ SOAJ injection Inject 0.3 mg into the muscle as needed for anaphylaxis.   furosemide (LASIX) 40 MG tablet Take 80 mg (2 tablets) in the morning and 40 mg (1 tablet) in the evening daily.   Multiple Vitamin (MULTIVITAMIN) capsule Take 1 capsule by mouth daily. Unknown strength   potassium chloride SA (KLOR-CON) 20 MEQ tablet Take 1 tablet (20 mEq total) by mouth daily.   spironolactone (ALDACTONE) 25 MG tablet Take 0.5 tablets (12.5 mg total) by mouth daily.   [DISCONTINUED] sacubitril-valsartan (ENTRESTO) 49-51 MG Take 1 tablet by mouth 2 (two) times daily. (Patient taking differently: Take 0.5 tablets by mouth daily.)     Review of Systems      All other systems reviewed and are otherwise negative except as noted above.  Physical Exam    VS:  BP 110/70 (BP Location: Left Arm, Patient Position: Sitting,  Cuff Size: Normal)   Pulse 70   Ht '5\' 1"'$  (1.549 m)   Wt 165 lb (74.8 kg)   BMI 31.18 kg/m  , BMI Body mass index is 31.18 kg/m.  Wt Readings from Last 3 Encounters:  11/06/20 165 lb (74.8 kg)  10/31/20 177 lb 11.1 oz (80.6 kg)  10/21/20 170 lb 12.8 oz (77.5 kg)     GEN: Well nourished, well developed, in no acute distress. HEENT: normal. Neck: Supple, no JVD, carotid bruits, or masses. Cardiac: RRR, no murmurs, rubs, or gallops. No clubbing, cyanosis, edema.  Radials/PT 2+ and equal bilaterally.  Respiratory:  Respirations regular and unlabored, clear to auscultation bilaterally. GI: Soft, nontender, nondistended. MS: No deformity or atrophy. Skin: Warm and dry, no rash. Neuro:  Strength and sensation are intact. Psych: Normal affect.  Assessment & Plan    Nonischemic cardiomyopathy/HFrEF-echocardiogram 05/28/2020 LVEF 30-35%, LA moderately dilated, moderate MR, moderate to  severe AI.  Recent admission with hyperkalemia in the setting of addition of spironolactone.  Her potassium is also has been reduced.  Monitoring, as below.  GDMT includes Entresto, Lasix, spironolactone.  She is presently taking half of an Entresto 49/51 mg tablet daily.  She will not tolerate increase strength due to low normal BP.  She was educated to take twice daily as prescribed.  She is presently taking Lasix 80 mg in the morning and 40 mg in the evening though does miss doses throughout the week due to participating in cardiac rehab order to prevent urinary frequency.  Hopeful that addition of spironolactone and Entresto being taken twice daily will reduce need for loop diuretic and this could be transitioned to once per day.  BNP ordered to assess.  Has not resumed Spironolactone since hospital discharge. Careful monitoring of hyperkalemia, as below. Pending lab results, consider resuming at 12.'5mg'$  QD.  Palpitations -no recurrence.  Not presently requiring AV nodal blocking therapy.  Recommend staying  well-hydrated, avoidance of caffeine.  Hyperkalemia -recent admission with hyperkalemia in the setting of addition of spironolactone.  Her potassium supplement has been reduced.  She has not resumed spironolactone 12.5 mg daily since hospital discharge.  We will repeat BMP, magnesium today. If potassium normal, plan to resume Arlyce Harman 12.'5mg'$  QD and further reduce or discontinue potassium supplement. Will require careful monitoring.   Disposition: Follow up  in December as scheduled  with Dr. Agustin Cree or APP.  Signed, Loel Dubonnet, NP 11/06/2020, 12:45 PM  [facility]

## 2020-11-07 ENCOUNTER — Telehealth (HOSPITAL_BASED_OUTPATIENT_CLINIC_OR_DEPARTMENT_OTHER): Payer: Self-pay | Admitting: Family

## 2020-11-07 ENCOUNTER — Other Ambulatory Visit (HOSPITAL_BASED_OUTPATIENT_CLINIC_OR_DEPARTMENT_OTHER): Payer: Self-pay | Admitting: Family

## 2020-11-07 DIAGNOSIS — E875 Hyperkalemia: Secondary | ICD-10-CM

## 2020-11-07 LAB — BRAIN NATRIURETIC PEPTIDE: BNP: 241.4 pg/mL — ABNORMAL HIGH (ref 0.0–100.0)

## 2020-11-07 LAB — BASIC METABOLIC PANEL
BUN/Creatinine Ratio: 25 (ref 12–28)
BUN: 42 mg/dL — ABNORMAL HIGH (ref 8–27)
CO2: 19 mmol/L — ABNORMAL LOW (ref 20–29)
Calcium: 10 mg/dL (ref 8.7–10.3)
Chloride: 101 mmol/L (ref 96–106)
Creatinine, Ser: 1.69 mg/dL — ABNORMAL HIGH (ref 0.57–1.00)
Glucose: 78 mg/dL (ref 65–99)
Potassium: 5.6 mmol/L — ABNORMAL HIGH (ref 3.5–5.2)
Sodium: 138 mmol/L (ref 134–144)
eGFR: 30 mL/min/{1.73_m2} — ABNORMAL LOW (ref >59)

## 2020-11-07 LAB — MAGNESIUM: Magnesium: 2.1 mg/dL (ref 1.6–2.3)

## 2020-11-07 MED ORDER — FUROSEMIDE 40 MG PO TABS: ORAL_TABLET | ORAL | 2 refills | Status: DC

## 2020-11-07 NOTE — Telephone Encounter (Signed)
Recent admission with hyperkalemia and seen in follow-up yesterday with labs performed.  Called to review lab results. Patient home phone busy - cell phone with no answer and VM not set up. Called her daughter, Barbaraann Rondo, per [place].  Kidney function improving compared to hospital discharge.  Potassium mildly elevated at 5.6.  We will discontinue spironolactone at this time (she had not taken since hospital discharge).  Additionally discontinue potassium supplement.  Continue Entresto 49 to 51 mg half tablet twice per day.  Continue Lasix 80 mg in the morning with 40 mg in the afternoon over the weekend to assist in normalizing potassium level.  Starting Monday she will take Lasix 80 mg in the morning with 40 mg only as needed for weight gain or fluid retention. Plan for repeat labs Monday for monitoring. If potassium remains elevated will need addition of Lokelma or adjustment of Entresto.  Her daughter verbalized understanding of all these instructions and was able to repeat them back to me.  Called pharmacy to inform them patient no longer taking Spironolactone nor Potassium supplement.  Re[facility] ordered for Monday at [facility].   Will route to Dr. Agustin Cree as an Juluis Rainier.   Loel Dubonnet, NP

## 2020-11-10 ENCOUNTER — Other Ambulatory Visit: Payer: Self-pay

## 2020-11-10 DIAGNOSIS — I428 Other cardiomyopathies: Secondary | ICD-10-CM

## 2020-11-10 DIAGNOSIS — I5022 Chronic systolic (congestive) heart failure: Secondary | ICD-10-CM | POA: Diagnosis not present

## 2020-11-10 DIAGNOSIS — E785 Hyperlipidemia, unspecified: Secondary | ICD-10-CM | POA: Diagnosis not present

## 2020-11-10 LAB — LIPID PANEL
Chol/HDL Ratio: 4.9 ratio — ABNORMAL HIGH (ref 0.0–4.4)
Cholesterol, Total: 199 mg/dL (ref 100–199)
HDL: 41 mg/dL (ref >39)
LDL Chol Calc (NIH): 119 mg/dL — ABNORMAL HIGH (ref 0–99)
Triglycerides: 223 mg/dL — ABNORMAL HIGH (ref 0–149)
VLDL Cholesterol Cal: 39 mg/dL (ref 5–40)

## 2020-11-10 LAB — BASIC METABOLIC PANEL
BUN/Creatinine Ratio: 19 (ref 12–28)
BUN: 35 mg/dL — ABNORMAL HIGH (ref 8–27)
CO2: 20 mmol/L (ref 20–29)
Calcium: 8.6 mg/dL — ABNORMAL LOW (ref 8.7–10.3)
Chloride: 101 mmol/L (ref 96–106)
Creatinine, Ser: 1.85 mg/dL — ABNORMAL HIGH (ref 0.57–1.00)
Glucose: 87 mg/dL (ref 65–99)
Potassium: 5.2 mmol/L (ref 3.5–5.2)
Sodium: 135 mmol/L (ref 134–144)
eGFR: 27 mL/min/{1.73_m2} — ABNORMAL LOW (ref >59)

## 2020-11-11 ENCOUNTER — Telehealth: Payer: Self-pay

## 2020-11-11 DIAGNOSIS — E875 Hyperkalemia: Secondary | ICD-10-CM

## 2020-11-11 DIAGNOSIS — N289 Disorder of kidney and ureter, unspecified: Secondary | ICD-10-CM

## 2020-11-11 NOTE — Telephone Encounter (Signed)
-----   Message from Park Liter, MD sent at 11/11/2020 10:57 AM EDT ----- Kidney function minimally worse than before.  I would recommend continue present management and lets recheck Chem-7 in about 10 days.  Cholesterol elevated ideally need to be on statin but she have difficulty tolerating it.  We will continue watching

## 2020-11-11 NOTE — Telephone Encounter (Signed)
Patient notified of results and recommendations. Agrees with plan. Lab order on file for future date.

## 2020-11-14 DIAGNOSIS — I5022 Chronic systolic (congestive) heart failure: Secondary | ICD-10-CM | POA: Diagnosis not present

## 2020-11-19 DIAGNOSIS — I5022 Chronic systolic (congestive) heart failure: Secondary | ICD-10-CM | POA: Diagnosis not present

## 2020-11-20 ENCOUNTER — Other Ambulatory Visit: Payer: Self-pay

## 2020-11-20 DIAGNOSIS — E875 Hyperkalemia: Secondary | ICD-10-CM | POA: Diagnosis not present

## 2020-11-20 DIAGNOSIS — N289 Disorder of kidney and ureter, unspecified: Secondary | ICD-10-CM

## 2020-11-20 LAB — BASIC METABOLIC PANEL
BUN/Creatinine Ratio: 13 (ref 12–28)
BUN: 20 mg/dL (ref 8–27)
CO2: 24 mmol/L (ref 20–29)
Calcium: 9.9 mg/dL (ref 8.7–10.3)
Chloride: 101 mmol/L (ref 96–106)
Creatinine, Ser: 1.54 mg/dL — ABNORMAL HIGH (ref 0.57–1.00)
Glucose: 85 mg/dL (ref 65–99)
Potassium: 4.6 mmol/L (ref 3.5–5.2)
Sodium: 141 mmol/L (ref 134–144)
eGFR: 33 mL/min/{1.73_m2} — ABNORMAL LOW (ref >59)

## 2020-11-21 DIAGNOSIS — I5022 Chronic systolic (congestive) heart failure: Secondary | ICD-10-CM | POA: Diagnosis not present

## 2020-11-26 DIAGNOSIS — I5022 Chronic systolic (congestive) heart failure: Secondary | ICD-10-CM | POA: Diagnosis not present

## 2020-12-08 ENCOUNTER — Other Ambulatory Visit: Payer: Self-pay

## 2020-12-08 ENCOUNTER — Ambulatory Visit (INDEPENDENT_AMBULATORY_CARE_PROVIDER_SITE_OTHER): Payer: Medicare Other

## 2020-12-08 DIAGNOSIS — T63441D Toxic effect of venom of bees, accidental (unintentional), subsequent encounter: Secondary | ICD-10-CM

## 2020-12-08 DIAGNOSIS — I5022 Chronic systolic (congestive) heart failure: Secondary | ICD-10-CM | POA: Diagnosis not present

## 2021-02-02 ENCOUNTER — Encounter: Payer: Self-pay | Admitting: Cardiology

## 2021-02-02 ENCOUNTER — Ambulatory Visit: Payer: Medicare Other

## 2021-02-02 ENCOUNTER — Other Ambulatory Visit: Payer: Self-pay

## 2021-02-02 ENCOUNTER — Other Ambulatory Visit: Payer: Self-pay | Admitting: Hematology and Oncology

## 2021-02-02 ENCOUNTER — Ambulatory Visit (INDEPENDENT_AMBULATORY_CARE_PROVIDER_SITE_OTHER): Payer: Medicare Other | Admitting: Cardiology

## 2021-02-02 VITALS — BP 120/74 | HR 89 | Ht 61.0 in | Wt 171.6 lb

## 2021-02-02 DIAGNOSIS — I428 Other cardiomyopathies: Secondary | ICD-10-CM | POA: Diagnosis not present

## 2021-02-02 DIAGNOSIS — E785 Hyperlipidemia, unspecified: Secondary | ICD-10-CM

## 2021-02-02 DIAGNOSIS — G8929 Other chronic pain: Secondary | ICD-10-CM

## 2021-02-02 DIAGNOSIS — C8339 Diffuse large B-cell lymphoma, extranodal and solid organ sites: Secondary | ICD-10-CM | POA: Diagnosis not present

## 2021-02-02 DIAGNOSIS — M25552 Pain in left hip: Secondary | ICD-10-CM

## 2021-02-02 DIAGNOSIS — I5020 Unspecified systolic (congestive) heart failure: Secondary | ICD-10-CM | POA: Diagnosis not present

## 2021-02-02 DIAGNOSIS — C83398 Diffuse large b-cell lymphoma of other extranodal and solid organ sites: Secondary | ICD-10-CM

## 2021-02-02 MED ORDER — FUROSEMIDE 80 MG PO TABS: 80.0000 mg | ORAL_TABLET | Freq: Two times a day (BID) | ORAL | 1 refills | Status: DC

## 2021-02-02 NOTE — Patient Instructions (Signed)
Medication Instructions:  Your physician has recommended you make the following change in your medication:  INCREASE: Lasix 80 mg twice daily  *If you need a refill on your cardiac medications before your next appointment, please call your pharmacy*   Lab Work: Your physician recommends that you return for lab work today:pro bnp, bmp  If you have labs (blood work) drawn today and your tests are completely normal, you will receive your results only by: Marie (if you have MyChart) OR A paper copy in the mail If you have any lab test that is abnormal or we need to change your treatment, we will call you to review the results.   Testing/Procedures: None   Follow-Up: At [facility], you and your health needs are our priority.  As part of our continuing mission to provide you with exceptional heart care, we have created designated Provider Care Teams.  These Care Teams include your primary Cardiologist (physician) and Advanced Practice Providers (APPs -  Physician Assistants and Nurse Practitioners) who all work together to provide you with the care you need, when you need it.  We recommend signing up for the patient portal called "MyChart".  Sign up information is provided on this After Visit Summary.  MyChart is used to connect with patients for Virtual Visits (Telemedicine).  Patients are able to view lab/test results, encounter notes, upcoming appointments, etc.  Non-urgent messages can be sent to your provider as well.   To learn more about what you can do with MyChart, go to [email or web address].    Your next appointment:   3 week(s)  The format for your next appointment:   In Person  Provider:   [name], MD    Other Instructions

## 2021-02-02 NOTE — Progress Notes (Signed)
Cardiology Office Note:    Date:  02/02/2021   ID:  Alexis Stephens Alexis Stephens, DOB [birth year], MRN [id number]  PCP:  [name], MD  Cardiologist:  [name], MD    Referring MD: [name], MD   Chief Complaint  Patient presents with   Shortness of Breath        L shoulder and chest soreness   Bilateral edema LE    History of Present Illness:    Alexis Stephens is a 85 y.o. female with past medical history significant for nonischemic cardiomyopathy with ejection fraction 30 to 35%, refusing ICD, history of non-Hodgkin's lymphoma, dyslipidemia, congestive heart failure with New York Heart Association class III. Since I seen her last time she end up going to the hospital I try to put her on Aldactone which resulted with very high potassium she required discontinuation of potassium increasing dose of diuretic and discontinuation of Aldactone.  She is in my office today complaining of having fatigue tiredness shortness of breath, she described proximal nocturnal dyspnea as well as swelling of lower extremities.  Clearly she does have mildly decompensated congestive heart failure.  Past Medical History:  Diagnosis Date   Anaphylaxis due to hymenoptera venom 10/30/2014   BMI 35.0-35.9,adult 02/10/2016   Cancer ([place])    Stage 4 Non-Hodgkin Lymphoma, clear for the past year   Cataract    Diffuse large B cell lymphoma ([place]) 04/09/2020   Dyslipidemia 10/19/2019   Dyspnea on exertion 10/30/2015   GERD (gastroesophageal reflux disease)    HFrEF (heart failure with reduced ejection fraction) ([place]) 11/25/2017   Hymenoptera allergy    Left hip pain 02/10/2016   Lymphadenopathy, inguinal 02/10/2016   Nonischemic cardiomyopathy ([place]) 11/25/2017   Overgrown toenails 09/21/2019   Palpitations 10/30/2015   Postoperative examination 02/17/2016   Swelling    Tachycardia 10/30/2015    Past Surgical History:  Procedure Laterality Date   ABDOMINAL HYSTERECTOMY      ADENOIDECTOMY     APPENDECTOMY     CHOLECYSTECTOMY     EYE SURGERY     NEPHROLITHOTOMY Left    TONSILLECTOMY      Current Medications: Current Meds  Medication Sig   acetaminophen (TYLENOL) 325 MG tablet Take 650 mg by mouth every 6 (six) hours as needed for mild pain, fever or headache.   cyclobenzaprine (FLEXERIL) 10 MG tablet Take 1 tablet (10 mg total) by mouth 3 (three) times daily as needed for muscle spasms.   EPINEPHrine 0.3 mg/0.3 mL IJ SOAJ injection Inject 0.3 mg into the muscle as needed for anaphylaxis.   furosemide (LASIX) 40 MG tablet Take 80 mg (2 tablets) in the morning with additional 40 mg (1 tablet) in the afternoon as needed for weight gain or edema. (Patient taking differently: Take 40-80 mg by mouth See admin instructions. Take 80 mg (2 tablets) in the morning with additional 40 mg (1 tablet) in the afternoon as needed for weight gain or edema.)   Multiple Vitamin (MULTIVITAMIN) capsule Take 1 capsule by mouth daily. Unknown strength   sacubitril-valsartan (ENTRESTO) 49-51 MG Take 0.5 tablets by mouth 2 (two) times daily.     Allergies:   Ivp dye [iodinated diagnostic agents], Mixed vespid venom, Wasp venom, Bee venom, Furadantin [nitrofurantoin], Ibuprofen, Iodine, Iodine-131, Magnesium citrate, Penicillamine, Penicillins, Sulfa antibiotics, and Sulfasalazine   Social History   Socioeconomic History   Marital status: Widowed    Spouse name: Not on file   Number of children:  Not on file   Years of education: Not on file   Highest education level: Not on file  Occupational History   Not on file  Tobacco Use   Smoking status: Former    Types: Cigarettes    Quit date: 08/24/1993    Years since quitting: 27.4   Smokeless tobacco: Never  Substance and Sexual Activity   Alcohol use: Yes    Comment: occasional   Drug use: Never   Sexual activity: Not on file  Other Topics Concern   Not on file  Social History Narrative   Not on file   Social  Determinants of Health   Financial Resource Strain: Not on file  Food Insecurity: Not on file  Transportation Needs: Not on file  Physical Activity: Not on file  Stress: Not on file  Social Connections: Not on file     Family History: The patient's family history includes Cancer in her brother; Osteoarthritis in her mother; Other in her father; Rheum arthritis in her mother. ROS:   Please see the history of present illness.    All 14 point review of systems negative except as described per history of present illness  EKGs/Labs/Other Studies Reviewed:      Recent Labs: 05/05/2020: NT-Pro BNP 3,442 10/30/2020: ALT 14; Hemoglobin 12.5; Platelets 203 11/06/2020: BNP 241.4; Magnesium 2.1 11/20/2020: BUN 20; Creatinine, Ser 1.54; Potassium 4.6; Sodium 141  Recent Lipid Panel    Component Value Date/Time   CHOL 199 11/10/2020 1127   TRIG 223 (H) 11/10/2020 1127   HDL 41 11/10/2020 1127   CHOLHDL 4.9 (H) 11/10/2020 1127   LDLCALC 119 (H) 11/10/2020 1127    Physical Exam:    VS:  BP 120/74 (BP Location: Left Arm, Patient Position: Sitting)   Pulse 89   Ht 5\' 1"  (1.549 m)   Wt 171 lb 9.6 oz (77.8 kg)   SpO2 94%   BMI 32.42 kg/m     Wt Readings from Last 3 Encounters:  02/02/21 171 lb 9.6 oz (77.8 kg)  11/06/20 165 lb (74.8 kg)  10/31/20 177 lb 11.1 oz (80.6 kg)     GEN:  Well nourished, well developed in no acute distress HEENT: Normal NECK: No JVD; No carotid bruits LYMPHATICS: No lymphadenopathy CARDIAC: RRR, no murmurs, no rubs, no gallops RESPIRATORY:  Clear to auscultation without rales, wheezing or rhonchi  ABDOMEN: Soft, non-tender, non-distended MUSCULOSKELETAL:  No edema; No deformity  SKIN: Warm and dry LOWER EXTREMITIES: no swelling NEUROLOGIC:  Alert and oriented x 3 PSYCHIATRIC:  Normal affect   ASSESSMENT:    1. Nonischemic cardiomyopathy (HCC)   2. HFrEF (heart failure with reduced ejection fraction) ([place])   3. Dyslipidemia   4. Diffuse large B-cell  lymphoma of extranodal site excluding spleen and other solid organs (HCC)    PLAN:    In order of problems listed above:  Nonischemic cardiomyopathy ejection fraction 30 to 35%, she is on Entresto, not a beta-blocker yet, she was on Aldactone of that was withdrawn because of hyperkalemia.  She is decompensated today only mildly.  I asked her to increase dose of furosemide from 80 in the morning and 40 in the afternoon to 80 twice daily, will do Chem-7 and proBNP today.  I will see her back in the office in about 3 weeks. Dyslipidemia, she does not want to take any cholesterol medication for now.  I did review K PN which show me her LDL of 119 HDL 41. Diffuse B-cell lymphoma seems  to be stable Congestive heart failure.  I asked her to restrict her fluids she does have kidney dysfunction which complicate the situation quite a lot.  If her kidney function is diminished I would go back to previous dose of diuretic and then will to vasodilatation with hydralazine and isosorbide.   Medication Adjustments/Labs and Tests Ordered: Current medicines are reviewed at length with the patient today.  Concerns regarding medicines are outlined above.  No orders of the defined types were placed in this encounter.  Medication changes: No orders of the defined types were placed in this encounter.   Signed, Park Liter, MD, [facility] 02/02/2021 10:55 AM    [place]

## 2021-02-03 LAB — BASIC METABOLIC PANEL
BUN/Creatinine Ratio: 14 (ref 12–28)
BUN: 26 mg/dL (ref 8–27)
CO2: 22 mmol/L (ref 20–29)
Calcium: 9.3 mg/dL (ref 8.7–10.3)
Chloride: 100 mmol/L (ref 96–106)
Creatinine, Ser: 1.8 mg/dL — ABNORMAL HIGH (ref 0.57–1.00)
Glucose: 82 mg/dL (ref 70–99)
Potassium: 4.1 mmol/L (ref 3.5–5.2)
Sodium: 141 mmol/L (ref 134–144)
eGFR: 28 mL/min/{1.73_m2} — ABNORMAL LOW (ref >59)

## 2021-02-03 LAB — PRO B NATRIURETIC PEPTIDE: NT-Pro BNP: 9376 pg/mL — ABNORMAL HIGH (ref 0–738)

## 2021-02-05 ENCOUNTER — Ambulatory Visit: Payer: Medicare Other

## 2021-02-06 ENCOUNTER — Telehealth: Payer: Self-pay | Admitting: Emergency Medicine

## 2021-02-06 DIAGNOSIS — I5020 Unspecified systolic (congestive) heart failure: Secondary | ICD-10-CM

## 2021-02-06 NOTE — Telephone Encounter (Signed)
-----   Message from Park Liter, MD sent at 02/04/2021  5:11 PM EST ----- She does have evidence of decompensated CHF on the lower test, please keep taking 80 mg furosemide twice daily, lets recheck her Chem-7 in 1 week

## 2021-02-06 NOTE — Telephone Encounter (Signed)
Patient informed of results she will be back next week for blood work.

## 2021-02-09 ENCOUNTER — Ambulatory Visit (INDEPENDENT_AMBULATORY_CARE_PROVIDER_SITE_OTHER): Payer: Medicare Other | Admitting: *Deleted

## 2021-02-09 DIAGNOSIS — T63441D Toxic effect of venom of bees, accidental (unintentional), subsequent encounter: Secondary | ICD-10-CM | POA: Diagnosis not present

## 2021-02-16 DIAGNOSIS — I5020 Unspecified systolic (congestive) heart failure: Secondary | ICD-10-CM | POA: Diagnosis not present

## 2021-02-17 LAB — BASIC METABOLIC PANEL
BUN/Creatinine Ratio: 16 (ref 12–28)
BUN: 17 mg/dL (ref 8–27)
CO2: 25 mmol/L (ref 20–29)
Calcium: 9.2 mg/dL (ref 8.7–10.3)
Chloride: 100 mmol/L (ref 96–106)
Creatinine, Ser: 1.08 mg/dL — ABNORMAL HIGH (ref 0.57–1.00)
Glucose: 87 mg/dL (ref 70–99)
Potassium: 4.7 mmol/L (ref 3.5–5.2)
Sodium: 137 mmol/L (ref 134–144)
eGFR: 51 mL/min/{1.73_m2} — ABNORMAL LOW (ref >59)

## 2021-03-11 ENCOUNTER — Encounter: Payer: Self-pay | Admitting: Cardiology

## 2021-03-11 ENCOUNTER — Ambulatory Visit (INDEPENDENT_AMBULATORY_CARE_PROVIDER_SITE_OTHER): Payer: Medicare Other | Admitting: Cardiology

## 2021-03-11 ENCOUNTER — Other Ambulatory Visit: Payer: Self-pay

## 2021-03-11 VITALS — BP 116/52 | HR 98 | Ht 62.0 in | Wt 174.0 lb

## 2021-03-11 DIAGNOSIS — I428 Other cardiomyopathies: Secondary | ICD-10-CM

## 2021-03-11 DIAGNOSIS — E785 Hyperlipidemia, unspecified: Secondary | ICD-10-CM

## 2021-03-11 DIAGNOSIS — I5020 Unspecified systolic (congestive) heart failure: Secondary | ICD-10-CM | POA: Diagnosis not present

## 2021-03-11 DIAGNOSIS — C8339 Diffuse large B-cell lymphoma, extranodal and solid organ sites: Secondary | ICD-10-CM | POA: Diagnosis not present

## 2021-03-11 LAB — BASIC METABOLIC PANEL
BUN/Creatinine Ratio: 13 (ref 12–28)
BUN: 20 mg/dL (ref 8–27)
CO2: 22 mmol/L (ref 20–29)
Calcium: 9.2 mg/dL (ref 8.7–10.3)
Chloride: 99 mmol/L (ref 96–106)
Creatinine, Ser: 1.51 mg/dL — ABNORMAL HIGH (ref 0.57–1.00)
Glucose: 82 mg/dL (ref 70–99)
Potassium: 4.4 mmol/L (ref 3.5–5.2)
Sodium: 137 mmol/L (ref 134–144)
eGFR: 34 mL/min/{1.73_m2} — ABNORMAL LOW (ref >59)

## 2021-03-11 MED ORDER — METOPROLOL TARTRATE 25 MG PO TABS: 12.5000 mg | ORAL_TABLET | Freq: Two times a day (BID) | ORAL | 1 refills | Status: DC

## 2021-03-11 NOTE — Progress Notes (Signed)
Cardiology Office Note:    Date:  03/11/2021   ID:  [name] Hailly Fess, DOB 07/19/1935, MRN [id number]  PCP:  [name], MD  Cardiologist:  [name], MD    Referring MD: [name], MD   Chief Complaint  Patient presents with   Shortness of Breath  I am doing fine I am doing better  History of Present Illness:    Alexis Stephens is a 86 y.o. female   with past medical history significant for nonischemic cardiomyopathy with ejection fraction 30 to 35%, refusing ICD, history of non-Hodgkin's lymphoma, dyslipidemia, congestive heart failure with New York Heart Association class III. She comes today to my office for follow-up.  Overall she is doing well.  She denies have any chest pain tightness squeezing pressure burning chest seems to be very happy and optimistic she gained some weight but she blame it on the fact that she does not work anymore she does not do much exercises as well as the fact that we had holiday and she ate a lot.  Past Medical History:  Diagnosis Date   Anaphylaxis due to hymenoptera venom 10/30/2014   BMI 35.0-35.9,adult 02/10/2016   Cancer ([place])    Stage 4 Non-Hodgkin Lymphoma, clear for the past year   Cataract    Diffuse large B cell lymphoma ([place]) 04/09/2020   Dyslipidemia 10/19/2019   Dyspnea on exertion 10/30/2015   GERD (gastroesophageal reflux disease)    HFrEF (heart failure with reduced ejection fraction) ([place]) 11/25/2017   Hymenoptera allergy    Left hip pain 02/10/2016   Lymphadenopathy, inguinal 02/10/2016   Nonischemic cardiomyopathy ([place]) 11/25/2017   Overgrown toenails 09/21/2019   Palpitations 10/30/2015   Postoperative examination 02/17/2016   Swelling    Tachycardia 10/30/2015    Past Surgical History:  Procedure Laterality Date   ABDOMINAL HYSTERECTOMY     ADENOIDECTOMY     APPENDECTOMY     CHOLECYSTECTOMY     EYE SURGERY     NEPHROLITHOTOMY Left    TONSILLECTOMY      Current  Medications: Current Meds  Medication Sig   acetaminophen (TYLENOL) 325 MG tablet Take 650 mg by mouth every 6 (six) hours as needed for mild pain, fever or headache.   cyclobenzaprine (FLEXERIL) 10 MG tablet Take 10 mg by mouth 3 (three) times daily as needed for muscle spasms.   EPINEPHrine 0.3 mg/0.3 mL IJ SOAJ injection Inject 0.3 mg into the muscle as needed for anaphylaxis.   furosemide (LASIX) 80 MG tablet Take 1 tablet (80 mg total) by mouth 2 (two) times daily.   Multiple Vitamin (MULTIVITAMIN) capsule Take 1 capsule by mouth daily. Unknown strength   sacubitril-valsartan (ENTRESTO) 49-51 MG Take 0.5 tablets by mouth 2 (two) times daily.     Allergies:   Ivp dye [iodinated contrast media], Mixed vespid venom, Wasp venom, Bee venom, Furadantin [nitrofurantoin], Ibuprofen, Iodine, Iodine-131, Magnesium citrate, Penicillamine, Penicillins, Sulfa antibiotics, and Sulfasalazine   Social History   Socioeconomic History   Marital status: Widowed    Spouse name: Not on file   Number of children: Not on file   Years of education: Not on file   Highest education level: Not on file  Occupational History   Not on file  Tobacco Use   Smoking status: Former    Types: Cigarettes    Quit date: 08/24/1993    Years since quitting: 27.5   Smokeless tobacco: Never  Substance and Sexual Activity  Alcohol use: Yes    Comment: occasional   Drug use: Never   Sexual activity: Not on file  Other Topics Concern   Not on file  Social History Narrative   Not on file   Social Determinants of Health   Financial Resource Strain: Not on file  Food Insecurity: Not on file  Transportation Needs: Not on file  Physical Activity: Not on file  Stress: Not on file  Social Connections: Not on file     Family History: The patient's family history includes Cancer in her brother; Osteoarthritis in her mother; Other in her father; Rheum arthritis in her mother. ROS:   Please see the history of  present illness.    All 14 point review of systems negative except as described per history of present illness  EKGs/Labs/Other Studies Reviewed:      Recent Labs: 10/30/2020: ALT 14; Hemoglobin 12.5; Platelets 203 11/06/2020: BNP 241.4; Magnesium 2.1 02/02/2021: NT-Pro BNP 9,376 02/16/2021: BUN 17; Creatinine, Ser 1.08; Potassium 4.7; Sodium 137  Recent Lipid Panel    Component Value Date/Time   CHOL 199 11/10/2020 1127   TRIG 223 (H) 11/10/2020 1127   HDL 41 11/10/2020 1127   CHOLHDL 4.9 (H) 11/10/2020 1127   LDLCALC 119 (H) 11/10/2020 1127    Physical Exam:    VS:  BP (!) 116/52 (BP Location: Left Arm, Patient Position: Sitting)    Pulse 98    Ht 5\' 2"  (1.575 m)    Wt 174 lb (78.9 kg)    SpO2 93%    BMI 31.83 kg/m     Wt Readings from Last 3 Encounters:  03/11/21 174 lb (78.9 kg)  02/02/21 171 lb 9.6 oz (77.8 kg)  11/06/20 165 lb (74.8 kg)     GEN:  Well nourished, well developed in no acute distress HEENT: Normal NECK: No JVD; No carotid bruits LYMPHATICS: No lymphadenopathy CARDIAC: RRR, no murmurs, no rubs, no gallops RESPIRATORY:  Clear to auscultation without rales, wheezing or rhonchi  ABDOMEN: Soft, non-tender, non-distended MUSCULOSKELETAL:  No edema; No deformity  SKIN: Warm and dry LOWER EXTREMITIES: no swelling NEUROLOGIC:  Alert and oriented x 3 PSYCHIATRIC:  Normal affect   ASSESSMENT:    1. HFrEF (heart failure with reduced ejection fraction) ([place])   2. Nonischemic cardiomyopathy ([place])   3. Dyslipidemia   4. Diffuse large B-cell lymphoma of extranodal site excluding spleen and other solid organs (H[facility])    PLAN:    In order of problems listed above:  Heart failure with reduced left ventricular ejection fraction.  She refused ICD, she is on Entresto, also on diuretic.  I will check Chem-7 today.  Previously she had a probably hyperkalemia while taking Aldactone therefore she is not on the medication.  I cannot put her on beta-blocker because of  first-degree AV block.  Therefore, we will do Chem-7 today if Chem-7 is acceptable we will consider [place], sadly, I anticipate is the fact that again she will not be able to afford it medication but will try Dyslipidemia she does not want to take any cholesterol medication. B-cell lymphoma stable   Medication Adjustments/Labs and Tests Ordered: Current medicines are reviewed at length with the patient today.  Concerns regarding medicines are outlined above.  No orders of the defined types were placed in this encounter.  Medication changes: No orders of the defined types were placed in this encounter.   Signed, Park Liter, MD, [facility] 03/11/2021 10:29 AM    [facility] °

## 2021-03-11 NOTE — Addendum Note (Signed)
Addended by: Edwyna Shell I on: 03/11/2021 10:38 AM   Modules accepted: Orders

## 2021-03-11 NOTE — Addendum Note (Signed)
Addended by: Edwyna Shell I on: 03/11/2021 11:05 AM   Modules accepted: Orders

## 2021-03-11 NOTE — Patient Instructions (Signed)
Medication Instructions:  Your physician has recommended you make the following change in your medication:   START: Metoprolol 12.5 mg twice daily  *If you need a refill on your cardiac medications before your next appointment, please call your pharmacy*   Lab Work: Your physician recommends that you return for lab work XA:JOIN today: BMP  If you have labs (blood work) drawn today and your tests are completely normal, you will receive your results only by: [place] (if you have MyChart) OR A paper copy in the mail If you have any lab test that is abnormal or we need to change your treatment, we will call you to review the results.   Testing/Procedures: None   Follow-Up: At [facility], you and your health needs are our priority.  As part of our continuing mission to provide you with exceptional heart care, we have created designated Provider Care Teams.  These Care Teams include your primary Cardiologist (physician) and Advanced Practice Providers (APPs -  Physician Assistants and Nurse Practitioners) who all work together to provide you with the care you need, when you need it.  We recommend signing up for the patient portal called "MyChart".  Sign up information is provided on this After Visit Summary.  MyChart is used to connect with patients for Virtual Visits (Telemedicine).  Patients are able to view lab/test results, encounter notes, upcoming appointments, etc.  Non-urgent messages can be sent to your provider as well.   To learn more about what you can do with MyChart, go to [email or web address].    Your next appointment:   3 month(s)  The format for your next appointment:   In Person  Provider:   [name], MD    Other Instructions  Metoprolol Extended-Release Capsules What is this medication? METOPROLOL (me TOE proe lole) treats high blood pressure and heart failure. It may also be used to prevent chest pain (angina). It works by lowering your  blood pressure and heart rate, making it easier for your heart to pump blood to the rest of your body. It belongs to a group of medications called beta blockers. This medicine may be used for other purposes; ask your health care provider or pharmacist if you have questions. COMMON BRAND NAME(S): [facility] What should I tell my care team before I take this medication? They need to know if you have any of these conditions: Diabetes Heart disease Liver disease Lung or breathing disease, like asthma Pheochromocytoma Thyroid disease An unusual or allergic reaction to metoprolol, other beta blockers, drugs, foods, dyes, or preservatives Pregnant or trying to get pregnant Breast-feeding How should I use this medication? Take this medication by mouth with water. Take it as directed on the prescription label at the same time every day. Do not cut, crush, or chew this medication. Swallow the capsules whole. You may open the capsule and put the contents in 1 teaspoon of applesauce. Swallow the medication and applesauce right away. Do not chew the medication or applesauce. Keep taking it unless your care team tells you to stop. Talk to your care team about the use of this medication in children. While it may be prescribed for children as young as 6 years for selected conditions, precautions do apply. Overdosage: If you think you have taken too much of this medicine contact a poison control center or emergency room at once. NOTE: This medicine is only for you. Do not share this medicine with others. What if I miss a dose? If you miss  a dose, take it as soon as you can. If it is almost time for your next dose, take only that dose. Do not take double or extra doses. What may interact with this medication? This medication may interact with the following: Certain medications for blood pressure, heart disease, irregular heartbeat Epinephrine Fluoxetine MAOIs like Carbex, Eldepryl, Marplan, Nardil, and  Parnate Paroxetine Reserpine This list may not describe all possible interactions. Give your health care provider a list of all the medicines, herbs, non-prescription drugs, or dietary supplements you use. Also tell them if you smoke, drink alcohol, or use illegal drugs. Some items may interact with your medicine. What should I watch for while using this medication? Visit your care team for regular checks on your progress. Check your blood pressure as directed. Ask your care team what your blood pressure should be. Also, find out when you should contact them. Do not treat yourself for coughs, colds, or pain while you are using this medication without asking your care team for advice. Some medications may increase your blood pressure. You may get drowsy or dizzy. Do not drive, use machinery, or do anything that needs mental alertness until you know how this medication affects you. Do not stand up or sit up quickly, especially if you are an older patient. This reduces the risk of dizzy or fainting spells. Alcohol may interfere with the effect of this medication. Avoid alcoholic drinks. This medication may increase blood sugar. Ask your care team if changes in diet or medications are needed if you have diabetes. What side effects may I notice from receiving this medication? Side effects that you should report to your care team as soon as possible: Allergic reactions--skin rash, itching, hives, swelling of the face, lips, tongue, or throat Heart failure--shortness of breath, swelling of the ankles, feet, or hands, sudden weight gain, unusual weakness or fatigue Low blood pressure--dizziness, feeling faint or lightheaded, blurry vision Raynaud's--cool, numb, or painful fingers or toes that may change color from pale, to blue, to red Slow heartbeat--dizziness, feeling faint or lightheaded, confusion, trouble breathing, unusual weakness or fatigue Worsening mood, feelings of depression Side effects that  usually do not require medical attention (report to your care team if they continue or are bothersome): Change in sex drive or performance Diarrhea Dizziness Fatigue Headache This list may not describe all possible side effects. Call your doctor for medical advice about side effects. You may report side effects to FDA at 1-800-FDA-1088. Where should I keep my medication? Keep out of the reach of children and pets. Store at room temperature between 20 and 25 degrees C (68 and 77 degrees F). Throw away any unused medication after the expiration date. NOTE: This sheet is a summary. It may not cover all possible information. If you have questions about this medicine, talk to your doctor, pharmacist, or health care provider.  2022 Elsevier/Gold Standard (2020-11-04 00:00:00)

## 2021-03-19 ENCOUNTER — Ambulatory Visit (INDEPENDENT_AMBULATORY_CARE_PROVIDER_SITE_OTHER): Payer: Medicare Other

## 2021-03-19 ENCOUNTER — Other Ambulatory Visit: Payer: Self-pay

## 2021-03-19 VITALS — BP 120/74 | HR 82 | Ht 62.0 in | Wt 180.0 lb

## 2021-03-19 DIAGNOSIS — I440 Atrioventricular block, first degree: Secondary | ICD-10-CM | POA: Diagnosis not present

## 2021-03-19 NOTE — Progress Notes (Signed)
Patient came in today for a nurse visit per Dr. Agustin Cree. The EKG was performed and reviewed by Dr. Geraldo Pitter DOD today. Dr. Geraldo Pitter states that it is stable and the patient is good to go.   I will let Dr. Agustin Cree know so that he can review this as well.

## 2021-03-31 DIAGNOSIS — E782 Mixed hyperlipidemia: Secondary | ICD-10-CM | POA: Diagnosis not present

## 2021-03-31 DIAGNOSIS — I10 Essential (primary) hypertension: Secondary | ICD-10-CM | POA: Diagnosis not present

## 2021-04-06 ENCOUNTER — Ambulatory Visit: Payer: Medicare Other

## 2021-04-13 ENCOUNTER — Ambulatory Visit (INDEPENDENT_AMBULATORY_CARE_PROVIDER_SITE_OTHER): Payer: Medicare Other | Admitting: *Deleted

## 2021-04-13 ENCOUNTER — Other Ambulatory Visit: Payer: Self-pay

## 2021-04-13 DIAGNOSIS — T63441D Toxic effect of venom of bees, accidental (unintentional), subsequent encounter: Secondary | ICD-10-CM

## 2021-04-27 ENCOUNTER — Telehealth: Payer: Self-pay

## 2021-04-27 NOTE — Telephone Encounter (Signed)
Message addressed.

## 2021-04-27 NOTE — Telephone Encounter (Signed)
Pt called leaving a message asking for a return call concerning her medication metoprolol. Please address

## 2021-04-30 ENCOUNTER — Telehealth: Payer: Self-pay | Admitting: Cardiology

## 2021-04-30 NOTE — Telephone Encounter (Signed)
Pt received patient assistance for Entresto but pharmacy says it's expired. She would like to switch medications ? ? ? ?[phone] ?

## 2021-05-04 NOTE — Telephone Encounter (Signed)
Left message for patient to call back  

## 2021-05-05 NOTE — Telephone Encounter (Signed)
Left message for patient to call us back.  

## 2021-05-06 NOTE — Telephone Encounter (Signed)
Left message for patient to call back  

## 2021-05-07 NOTE — Telephone Encounter (Signed)
Called patient and informed her of the option to get patient assistance to help her stay on Entresto. She stated she had already turned in some paperwork for assistance. Will follow up to see if the paperwork has already been sent. Patient stated she has enough enough Entresto to last her through March. ?

## 2021-05-08 NOTE — Telephone Encounter (Signed)
Spoke with [employer] rep, she informed me no application on file. I will resend the original forms. I called the patient and was unable to reach her will try later.  ?

## 2021-05-11 ENCOUNTER — Telehealth: Payer: Self-pay | Admitting: Cardiology

## 2021-05-11 NOTE — Telephone Encounter (Signed)
Pt c/o medication issue: ? ?1. Name of Medication:  ? sacubitril-valsartan (ENTRESTO) 49-51 MG  ? ? ?2. How are you currently taking this medication (dosage and times per day)? Take 0.5 tablets by mouth 2 (two) times daily. ? ?3. Are you having a reaction (difficulty breathing--STAT)? No ? ?4. What is your medication issue?  Pt stated that the Delene Loll will cost her $700 for a 30 day supply. She said that she's unable to afford that. She wants to know of she can get an cheaper alternative ?

## 2021-05-13 ENCOUNTER — Other Ambulatory Visit: Payer: Self-pay

## 2021-05-13 MED ORDER — LOSARTAN POTASSIUM 50 MG PO TABS: 50.0000 mg | ORAL_TABLET | Freq: Every day | ORAL | 3 refills | Status: DC

## 2021-05-13 NOTE — Telephone Encounter (Signed)
Left message for patient to call back  

## 2021-05-13 NOTE — Telephone Encounter (Signed)
Patient returned call explaining how she could not afford entresto. I offered her patient assistance to help cover some of the cost of the medication. She stated she already did that and it did not help. I explained the situation to Dr. Agustin Cree and he ordered Losartan for her and discontinued the entresto. Patient was agreeable with this plan. ?

## 2021-05-13 NOTE — Telephone Encounter (Signed)
Patient is returning call.  °

## 2021-06-08 ENCOUNTER — Ambulatory Visit: Payer: Medicare Other

## 2021-06-22 ENCOUNTER — Ambulatory Visit (INDEPENDENT_AMBULATORY_CARE_PROVIDER_SITE_OTHER): Payer: Medicare Other | Admitting: *Deleted

## 2021-06-22 DIAGNOSIS — T63441D Toxic effect of venom of bees, accidental (unintentional), subsequent encounter: Secondary | ICD-10-CM

## 2021-06-22 NOTE — Progress Notes (Signed)
?Alexis Stephens  ?[address] ?[place],  [place]  [address] ?[phone] ? ?Clinic Day:  06/23/2021 ? ?Referring physician: [name], MD ? ?HISTORY OF PRESENT ILLNESS:  ?The patient is an 86 y.o. female with  stage IVA diffuse large B-cell lymphoma, which included pulmonary, subcutaneous, osseous and muscular involvement.  Her PET scan showed a complete response after she completed her 6 cycles of R-CHOP chemotherapy in April 2018.  The patient comes in today for routine follow up.  Since her last visit, the patient has been doing okay.   Her major health problem has been congestive heart failure.  She claims she is now taking Lasix 80 mg twice daily.  Despite this, she believes she has gained as much is 10-20 pounds over these past few months.  She also becomes occasionally short of breath with exertion.  She is scheduled to see her cardiologist later on this afternoon to evaluate all of this.  As it pertains to her lymphoma, she denies having any B symptoms or bulky lymphadenopathy which concern her for late disease recurrence.    ? ?PHYSICAL EXAM:  ?Blood pressure (!) 141/74, pulse 94, temperature 97.6 ?F (36.4 ?C), resp. rate 18, height '5\' 2"'$  (1.575 m), weight 177 lb 11.2 oz (80.6 kg), SpO2 98 %. ?Wt Readings from Last 3 Encounters:  ?06/23/21 177 lb 11.2 oz (80.6 kg)  ?03/19/21 180 lb (81.6 kg)  ?03/11/21 174 lb (78.9 kg)  ? ?Body mass index is 32.5 kg/m?Marland Kitchen ?Performance status (ECOG): 1 - Symptomatic but completely ambulatory ?Physical Exam ?Constitutional:   ?   Appearance: Normal appearance. She is not ill-appearing.  ?HENT:  ?   Mouth/Throat:  ?   Mouth: Mucous membranes are moist.  ?   Pharynx: Oropharynx is clear. No oropharyngeal exudate or posterior oropharyngeal erythema.  ?Cardiovascular:  ?   Rate and Rhythm: Normal rate and regular rhythm.  ?   Heart sounds: No murmur heard. ?  No friction rub. No gallop.  ?Pulmonary:  ?   Effort: Pulmonary effort is normal.  No respiratory distress.  ?   Breath sounds: Normal breath sounds. No wheezing, rhonchi or rales.  ?Abdominal:  ?   General: Bowel sounds are normal. There is no distension.  ?   Palpations: Abdomen is soft. There is no mass.  ?   Tenderness: There is no abdominal tenderness.  ?Musculoskeletal:     ?   General: No swelling.  ?   Right lower leg: No edema.  ?   Left lower leg: No edema.  ?Lymphadenopathy:  ?   Cervical: No cervical adenopathy.  ?   Upper Body:  ?   Right upper body: No supraclavicular or axillary adenopathy.  ?   Left upper body: No supraclavicular or axillary adenopathy.  ?   Lower Body: No right inguinal adenopathy. No left inguinal adenopathy.  ?Skin: ?   General: Skin is warm.  ?   Coloration: Skin is not jaundiced.  ?   Findings: No lesion or rash.  ?Neurological:  ?   General: No focal deficit present.  ?   Mental Status: She is alert and oriented to person, place, and time. Mental status is at baseline.  ?Psychiatric:     ?   Mood and Affect: Mood normal.     ?   Behavior: Behavior normal.     ?   Thought Content: Thought content normal.  ? ?LABS:   ? ? ? Latest  Reference Range & Units 06/23/21 09:01  ?LDH 98 - 192 U/L 179  ? ? Latest Reference Range & Units 06/23/21 12:29  ?Iron 28 - 170 ug/dL 98  ?UIBC ug/dL 264  ?TIBC 250 - 450 ug/dL 362  ?Saturation Ratios 10.4 - 31.8 % 27  ?Ferritin 11 - 307 ng/mL 59  ?Folate >5.9 ng/mL 12.0  ?Vitamin B12 180 - 914 pg/mL 137 (L)  ?(L): Data is abnormally low ? ?ASSESSMENT & PLAN:  ?Assessment/Plan:  An 86 y.o. female with stage IVA diffuse large B-cell lymphoma, who completed 6 cycles of R-CHOP chemotherapy in April 2018.  Based upon her labs and physical exam today, the patient remains disease free.  As the patient has been cancer free for 5 years, I do consider her cured of her diffuse large B-cell lymphoma.  Moving forward, the patient understands it will be imperative for her to keep her congestive heart failure in check as this is the major health  issue that will likely factor into her future morbidity/mortality.  When looking at her labs today, her vitamin B12 level was low, which would explain her macrocytic red cell indices.  Based upon this, I will place her on a protracted course of B12 injections, which will include her taking 1000 mcg daily x7 days, followed by weekly x4 weeks, followed by monthly injections indefinitely.  I do recommend that her primary care office follow her B12 levels over time to ensure her cobalamin stores remain adequately fortified.  Otherwise, I do feel comfortable turning her care back over to her other physicians.  I would not have a problem seeing her in the future if new hematologic or oncologic issues arise that require repeat clinical assessment.  The patient understands all the plans discussed today and is in agreement with them.   ? ?Sihaam Chrobak Macarthur Critchley, MD ? ? ? ?  ?

## 2021-06-23 ENCOUNTER — Inpatient Hospital Stay: Payer: Medicare Other | Attending: Oncology | Admitting: Oncology

## 2021-06-23 ENCOUNTER — Ambulatory Visit (INDEPENDENT_AMBULATORY_CARE_PROVIDER_SITE_OTHER): Payer: Medicare Other | Admitting: Cardiology

## 2021-06-23 ENCOUNTER — Other Ambulatory Visit: Payer: Self-pay | Admitting: Oncology

## 2021-06-23 ENCOUNTER — Inpatient Hospital Stay: Payer: Medicare Other

## 2021-06-23 ENCOUNTER — Encounter: Payer: Self-pay | Admitting: Cardiology

## 2021-06-23 VITALS — BP 141/74 | HR 94 | Temp 97.6°F | Resp 18 | Ht 62.0 in | Wt 177.7 lb

## 2021-06-23 VITALS — BP 108/62 | HR 88 | Ht 61.5 in | Wt 178.2 lb

## 2021-06-23 DIAGNOSIS — E538 Deficiency of other specified B group vitamins: Secondary | ICD-10-CM | POA: Insufficient documentation

## 2021-06-23 DIAGNOSIS — D649 Anemia, unspecified: Secondary | ICD-10-CM | POA: Diagnosis not present

## 2021-06-23 DIAGNOSIS — R0602 Shortness of breath: Secondary | ICD-10-CM | POA: Insufficient documentation

## 2021-06-23 DIAGNOSIS — I509 Heart failure, unspecified: Secondary | ICD-10-CM | POA: Insufficient documentation

## 2021-06-23 DIAGNOSIS — C8338 Diffuse large B-cell lymphoma, lymph nodes of multiple sites: Secondary | ICD-10-CM

## 2021-06-23 DIAGNOSIS — C8339 Diffuse large B-cell lymphoma, extranodal and solid organ sites: Secondary | ICD-10-CM | POA: Insufficient documentation

## 2021-06-23 DIAGNOSIS — I5020 Unspecified systolic (congestive) heart failure: Secondary | ICD-10-CM

## 2021-06-23 DIAGNOSIS — E785 Hyperlipidemia, unspecified: Secondary | ICD-10-CM | POA: Diagnosis not present

## 2021-06-23 DIAGNOSIS — Z556 Problems related to health literacy: Secondary | ICD-10-CM | POA: Diagnosis not present

## 2021-06-23 DIAGNOSIS — R002 Palpitations: Secondary | ICD-10-CM

## 2021-06-23 DIAGNOSIS — Z79899 Other long term (current) drug therapy: Secondary | ICD-10-CM | POA: Insufficient documentation

## 2021-06-23 HISTORY — DX: Deficiency of other specified B group vitamins: E53.8

## 2021-06-23 LAB — HEPATIC FUNCTION PANEL
ALT: 14 U/L (ref 7–35)
AST: 23 (ref 13–35)
Alkaline Phosphatase: 48 (ref 25–125)
Bilirubin, Total: 0.7

## 2021-06-23 LAB — VITAMIN B12: Vitamin B-12: 137 pg/mL — ABNORMAL LOW (ref 180–914)

## 2021-06-23 LAB — BASIC METABOLIC PANEL
BUN: 17 (ref 4–21)
CO2: 27 — AB (ref 13–22)
Chloride: 107 (ref 99–108)
Creatinine: 1.2 — AB (ref 0.5–1.1)
Glucose: 89
Potassium: 4.2 mEq/L (ref 3.5–5.1)
Sodium: 144 (ref 137–147)

## 2021-06-23 LAB — CBC AND DIFFERENTIAL
HCT: 39 (ref 36–46)
Hemoglobin: 12.7 (ref 12.0–16.0)
Neutrophils Absolute: 3.38
Platelets: 230 10*3/uL (ref 150–400)
WBC: 4.9

## 2021-06-23 LAB — IRON AND TIBC
Iron: 98 ug/dL (ref 28–170)
Saturation Ratios: 27 % (ref 10.4–31.8)
TIBC: 362 ug/dL (ref 250–450)
UIBC: 264 ug/dL

## 2021-06-23 LAB — LACTATE DEHYDROGENASE: LDH: 179 U/L (ref 98–192)

## 2021-06-23 LAB — CBC: RBC: 3.8 — AB (ref 3.87–5.11)

## 2021-06-23 LAB — COMPREHENSIVE METABOLIC PANEL
Albumin: 4.1 (ref 3.5–5.0)
Calcium: 8.8 (ref 8.7–10.7)

## 2021-06-23 LAB — FOLATE: Folate: 12 ng/mL (ref >5.9)

## 2021-06-23 LAB — FERRITIN: Ferritin: 59 ng/mL (ref 11–307)

## 2021-06-23 NOTE — Patient Instructions (Signed)
Medication Instructions:  ?Your physician recommends that you continue on your current medications as directed. Please refer to the Current Medication list given to you today. ? ?*If you need a refill on your cardiac medications before your next appointment, please call your pharmacy* ? ? ?Lab Work: ?Your physician recommends that you return for lab work in: Pro BNP and BMP today  ?If you have labs (blood work) drawn today and your tests are completely normal, you will receive your results only by: ?MyChart Message (if you have MyChart) OR ?A paper copy in the mail ?If you have any lab test that is abnormal or we need to change your treatment, we will call you to review the results. ? ? ?Testing/Procedures: ?None  ? ? ?Follow-Up: ?At [facility], you and your health needs are our priority.  As part of our continuing mission to provide you with exceptional heart care, we have created designated Provider Care Teams.  These Care Teams include your primary Cardiologist (physician) and Advanced Practice Providers (APPs -  Physician Assistants and Nurse Practitioners) who all work together to provide you with the care you need, when you need it. ? ?We recommend signing up for the patient portal called "MyChart".  Sign up information is provided on this After Visit Summary.  MyChart is used to connect with patients for Virtual Visits (Telemedicine).  Patients are able to view lab/test results, encounter notes, upcoming appointments, etc.  Non-urgent messages can be sent to your provider as well.   ?To learn more about what you can do with MyChart, go to [email or web address].   ? ?Your next appointment:   ?1 week(s) ? ?The format for your next appointment:   ?In Person ? ?Provider:   ?[name], MD  ? ? ?Other Instructions ? ? ?Important Information About Sugar ? ? ? ? ?  ?

## 2021-06-23 NOTE — Progress Notes (Signed)
?Cardiology Office Note:   ? ?Date:  06/23/2021  ? ?ID:  Alexis Stephens, DOB 04/07/1935, MRN [id number] ? ?PCP:  [name], MD  ?Cardiologist:  [name], MD   ? ?Referring MD: [name], MD  ? ?Chief Complaint  ?Patient presents with  ? Leg Swelling  ?  Discoloration   ? Shortness of Breath  ?   ?  ? ? ?History of Present Illness:   ? ?Alexis Stephens is a 86 y.o. female with past medical history significant for cardiomyopathy with severely reduced left ventricle ejection fraction, she refused ICD.  There is also some issue with compliance she cannot afford some of the medications for example Entresto so she is only on losartan.  Recently she went to the beach she was there for about 2 weeks she ate very nicely and well her daughter was cooking very consciously about avoidance of too much salt but in spite of that she skipped many dosages of her diuretic and today she comes to office decompensated she does have swelling of lower extremities she described paroxysmal nocturnal dyspnea.  Also describes sometimes to have heaviness in the chest when she gets short of breath.  She came back from the beach about a week ago. ? ?Past Medical History:  ?Diagnosis Date  ? Anaphylaxis due to hymenoptera venom 10/30/2014  ? BMI 35.0-35.9,adult 02/10/2016  ? Cancer [facility])   ? Stage 4 Non-Hodgkin Lymphoma, clear for the past year  ? Cataract   ? Diffuse large B cell lymphoma ([place]) 04/09/2020  ? Dyslipidemia 10/19/2019  ? Dyspnea on exertion 10/30/2015  ? GERD (gastroesophageal reflux disease)   ? HFrEF (heart failure with reduced ejection fraction) ([place]) 11/25/2017  ? Hymenoptera allergy   ? Left hip pain 02/10/2016  ? Lymphadenopathy, inguinal 02/10/2016  ? Nonischemic cardiomyopathy ([place]) 11/25/2017  ? Overgrown toenails 09/21/2019  ? Palpitations 10/30/2015  ? Postoperative examination 02/17/2016  ? Swelling   ? Tachycardia 10/30/2015  ? ? ?Past Surgical History:  ?Procedure Laterality  Date  ? ABDOMINAL HYSTERECTOMY    ? ADENOIDECTOMY    ? APPENDECTOMY    ? CHOLECYSTECTOMY    ? EYE SURGERY    ? NEPHROLITHOTOMY Left   ? TONSILLECTOMY    ? ? ?Current Medications: ?Current Meds  ?Medication Sig  ? acetaminophen (TYLENOL) 325 MG tablet Take 650 mg by mouth every 6 (six) hours as needed for mild pain, fever or headache.  ? cyclobenzaprine (FLEXERIL) 10 MG tablet Take 10 mg by mouth 3 (three) times daily as needed for muscle spasms.  ? EPINEPHrine 0.3 mg/0.3 mL IJ SOAJ injection Inject 0.3 mg into the muscle as needed for anaphylaxis.  ? furosemide (LASIX) 80 MG tablet Take 1 tablet (80 mg total) by mouth 2 (two) times daily.  ? losartan (COZAAR) 50 MG tablet Take 1 tablet (50 mg total) by mouth daily.  ? metoprolol tartrate (LOPRESSOR) 25 MG tablet Take 0.5 tablets (12.5 mg total) by mouth 2 (two) times daily.  ? Multiple Vitamin (MULTIVITAMIN) capsule Take 1 capsule by mouth daily. Unknown strength  ?  ? ?Allergies:   Ivp dye [iodinated contrast media], Mixed vespid venom, Wasp venom, Bee venom, Furadantin [nitrofurantoin], Ibuprofen, Iodine, Iodine-131, Magnesium citrate, Penicillamine, Penicillins, Sulfa antibiotics, and Sulfasalazine  ? ?Social History  ? ?Socioeconomic History  ? Marital status: Widowed  ?  Spouse name: Not on file  ? Number of children: Not on file  ? Years of education:  Not on file  ? Highest education level: Not on file  ?Occupational History  ? Not on file  ?Tobacco Use  ? Smoking status: Former  ?  Types: Cigarettes  ?  Quit date: 08/24/1993  ?  Years since quitting: 27.8  ? Smokeless tobacco: Never  ?Substance and Sexual Activity  ? Alcohol use: Yes  ?  Comment: occasional  ? Drug use: Never  ? Sexual activity: Not on file  ?Other Topics Concern  ? Not on file  ?Social History Narrative  ? Not on file  ? ?Social Determinants of Health  ? ?Financial Resource Strain: Not on file  ?Food Insecurity: Not on file  ?Transportation Needs: Not on file  ?Physical Activity: Not on file   ?Stress: Not on file  ?Social Connections: Not on file  ?  ? ?Family History: ?The patient's family history includes Cancer in her brother; Osteoarthritis in her mother; Other in her father; Rheum arthritis in her mother. ?ROS:   ?Please see the history of present illness.    ?All 14 point review of systems negative except as described per history of present illness ? ?EKGs/Labs/Other Studies Reviewed:   ? ? ? ?Recent Labs: ?11/06/2020: BNP 241.4; Magnesium 2.1 ?02/02/2021: NT-Pro BNP 9,376 ?06/23/2021: ALT 14; BUN 17; Creatinine 1.2; Hemoglobin 12.7; Platelets 230; Potassium 4.2; Sodium 144  ?Recent Lipid Panel ?   ?Component Value Date/Time  ? CHOL 199 11/10/2020 1127  ? TRIG 223 (H) 11/10/2020 1127  ? HDL 41 11/10/2020 1127  ? CHOLHDL 4.9 (H) 11/10/2020 1127  ? LDLCALC 119 (H) 11/10/2020 1127  ? ? ?Physical Exam:   ? ?VS:  BP 108/62 (BP Location: Right Arm, Patient Position: Sitting)   Pulse 88   Ht 5' 1.5" (1.562 m)   Wt 178 lb 3.2 oz (80.8 kg)   SpO2 92%   BMI 33.13 kg/m?    ? ?Wt Readings from Last 3 Encounters:  ?06/23/21 178 lb 3.2 oz (80.8 kg)  ?06/23/21 177 lb 11.2 oz (80.6 kg)  ?03/19/21 180 lb (81.6 kg)  ?  ? ?GEN:  Well nourished, well developed in no acute distress ?HEENT: Normal ?NECK: No JVD; No carotid bruits ?LYMPHATICS: No lymphadenopathy ?CARDIAC: RRR, no murmurs, no rubs, no gallops ?RESPIRATORY:  Clear to auscultation without rales, wheezing or rhonchi  ?ABDOMEN: Soft, non-tender, non-distended ?MUSCULOSKELETAL:  No edema; No deformity  ?SKIN: Warm and dry ?LOWER EXTREMITIES: no swelling ?NEUROLOGIC:  Alert and oriented x 3 ?PSYCHIATRIC:  Normal affect  ? ?ASSESSMENT:   ? ?1. HFrEF (heart failure with reduced ejection fraction) ([place])   ?2. Palpitations   ?3. Dyslipidemia   ? ?PLAN:   ? ?In order of problems listed above: ? ?Decompensated CHF.  We spent a great deal of time talking to her with her daughter I recommend to start and keep taking medications which showed she need to be taking 80  mg furosemide every single day twice daily actually however she was not doing this lately.  She will do it check Chem-7 today I see her back in the office next week in the meantime we will continue with metoprolol as well as we will continue with losartan. ?Palpitations: Denies having any.  She does not want to have ICD. ?Dyslipidemia.  Not taking any statin. ? ?Overall she is decompensated today probably the reason for decompensation of the fact she was on the beach for 2 weeks and did not take her diuretics the way she should I told her if shortness  of breath became worse she need to go to the emergency room ? ? ?Medication Adjustments/Labs and Tests Ordered: ?Current medicines are reviewed at length with the patient today.  Concerns regarding medicines are outlined above.  ?No orders of the defined types were placed in this encounter. ? ?Medication changes: No orders of the defined types were placed in this encounter. ? ? ?Signed, ?Park Liter, MD, [facility] ?06/23/2021 3:32 PM    ?[place] ?

## 2021-06-23 NOTE — Addendum Note (Signed)
Addended by: Darrel Reach on: 06/23/2021 03:37 PM ? ? Modules accepted: Orders ? ?

## 2021-06-24 LAB — BASIC METABOLIC PANEL
BUN/Creatinine Ratio: 13 (ref 12–28)
BUN: 16 mg/dL (ref 8–27)
CO2: 23 mmol/L (ref 20–29)
Calcium: 8.9 mg/dL (ref 8.7–10.3)
Chloride: 108 mmol/L — ABNORMAL HIGH (ref 96–106)
Creatinine, Ser: 1.23 mg/dL — ABNORMAL HIGH (ref 0.57–1.00)
Glucose: 77 mg/dL (ref 70–99)
Potassium: 4.8 mmol/L (ref 3.5–5.2)
Sodium: 145 mmol/L — ABNORMAL HIGH (ref 134–144)
eGFR: 44 mL/min/{1.73_m2} — ABNORMAL LOW (ref >59)

## 2021-06-24 LAB — PRO B NATRIURETIC PEPTIDE: NT-Pro BNP: 30320 pg/mL — ABNORMAL HIGH (ref 0–738)

## 2021-06-30 ENCOUNTER — Encounter: Payer: Self-pay | Admitting: Cardiology

## 2021-06-30 ENCOUNTER — Ambulatory Visit (INDEPENDENT_AMBULATORY_CARE_PROVIDER_SITE_OTHER): Payer: Medicare Other | Admitting: Cardiology

## 2021-06-30 VITALS — BP 128/76 | HR 65 | Ht 61.0 in | Wt 173.2 lb

## 2021-06-30 DIAGNOSIS — R002 Palpitations: Secondary | ICD-10-CM | POA: Diagnosis not present

## 2021-06-30 DIAGNOSIS — I5020 Unspecified systolic (congestive) heart failure: Secondary | ICD-10-CM | POA: Diagnosis not present

## 2021-06-30 NOTE — Patient Instructions (Signed)
Medication Instructions:  ?Your physician recommends that you continue on your current medications as directed. Please refer to the Current Medication list given to you today.  ?*If you need a refill on your cardiac medications before your next appointment, please call your pharmacy* ? ? ?Lab Work: ?BMP Today ?If you have labs (blood work) drawn today and your tests are completely normal, you will receive your results only by: ?MyChart Message (if you have MyChart) OR ?A paper copy in the mail ?If you have any lab test that is abnormal or we need to change your treatment, we will call you to review the results. ? ? ?Testing/Procedures: ?None Ordered ? ? ?Follow-Up: ?At [facility], you and your health needs are our priority.  As part of our continuing mission to provide you with exceptional heart care, we have created designated Provider Care Teams.  These Care Teams include your primary Cardiologist (physician) and Advanced Practice Providers (APPs -  Physician Assistants and Nurse Practitioners) who all work together to provide you with the care you need, when you need it. ? ?We recommend signing up for the patient portal called "MyChart".  Sign up information is provided on this After Visit Summary.  MyChart is used to connect with patients for Virtual Visits (Telemedicine).  Patients are able to view lab/test results, encounter notes, upcoming appointments, etc.  Non-urgent messages can be sent to your provider as well.   ?To learn more about what you can do with MyChart, go to [email or web address].   ? ?Your next appointment:   ?3 week(s) ? ?The format for your next appointment:   ?In Person ? ?Provider:   ?[name], MD  ? ? ?Other Instructions ?NA  ?

## 2021-06-30 NOTE — Progress Notes (Signed)
?Cardiology Office Note:   ? ?Date:  06/30/2021  ? ?ID:  Alexis Stephens, DOB [birth year], MRN [id number] ? ?PCP:  [name], MD  ?Cardiologist:  [name], MD   ? ?Referring MD: [name], MD  ? ?Chief Complaint  ?Patient presents with  ? Leg Swelling  ? ? ?History of Present Illness:   ? ?Alexis Stephens is a 86 y.o. female  with past medical history significant for cardiomyopathy with severely reduced left ventricle ejection fraction, she refused ICD.  There is also some issue with compliance she cannot afford some of the medications for example Entresto so she is only on losartan.  ?Last time I seen the patient is currently, as a congestive heart failure.  The issue was the fact that she was not taking her medication the proper way.  She simply skipped some diuretics.  We put her back on appropriate medication she takes actually furosemide 20 mg 4 times a day.  Since she seems to be doing better she lost few pounds shortness of breath better swelling is improved she however mentioned that she ordered some [place] food that she had for 3 days which is obviously problematic.  Denies have any chest pain tightness squeezing pressure burning chest. ? ?Past Medical History:  ?Diagnosis Date  ? Anaphylaxis due to hymenoptera venom 10/30/2014  ? BMI 35.0-35.9,adult 02/10/2016  ? Cancer [facility])   ? Stage 4 Non-Hodgkin Lymphoma, clear for the past year  ? Cataract   ? Diffuse large B cell lymphoma ([place]) 04/09/2020  ? Dyslipidemia 10/19/2019  ? Dyspnea on exertion 10/30/2015  ? GERD (gastroesophageal reflux disease)   ? HFrEF (heart failure with reduced ejection fraction) ([place]) 11/25/2017  ? Hymenoptera allergy   ? Left hip pain 02/10/2016  ? Lymphadenopathy, inguinal 02/10/2016  ? Nonischemic cardiomyopathy ([place]) 11/25/2017  ? Overgrown toenails 09/21/2019  ? Palpitations 10/30/2015  ? Postoperative examination 02/17/2016  ? Swelling   ? Tachycardia 10/30/2015  ? ? ?Past Surgical  History:  ?Procedure Laterality Date  ? ABDOMINAL HYSTERECTOMY    ? ADENOIDECTOMY    ? APPENDECTOMY    ? CHOLECYSTECTOMY    ? EYE SURGERY    ? NEPHROLITHOTOMY Left   ? TONSILLECTOMY    ? ? ?Current Medications: ?Current Meds  ?Medication Sig  ? acetaminophen (TYLENOL) 325 MG tablet Take 650 mg by mouth every 6 (six) hours as needed for mild pain, fever or headache.  ? cyclobenzaprine (FLEXERIL) 10 MG tablet Take 10 mg by mouth 3 (three) times daily as needed for muscle spasms.  ? EPINEPHrine 0.3 mg/0.3 mL IJ SOAJ injection Inject 0.3 mg into the muscle as needed for anaphylaxis.  ? furosemide (LASIX) 80 MG tablet Take 1 tablet (80 mg total) by mouth 2 (two) times daily.  ? losartan (COZAAR) 50 MG tablet Take 1 tablet (50 mg total) by mouth daily.  ? metoprolol tartrate (LOPRESSOR) 25 MG tablet Take 0.5 tablets (12.5 mg total) by mouth 2 (two) times daily.  ? Multiple Vitamin (MULTIVITAMIN) capsule Take 1 capsule by mouth daily. Unknown strength  ? [DISCONTINUED] sacubitril-valsartan (ENTRESTO) 49-51 MG Take 0.5 tablets by mouth 2 (two) times daily.  ?  ? ?Allergies:   Ivp dye [iodinated contrast media], Mixed vespid venom, Wasp venom, Bee venom, Furadantin [nitrofurantoin], Ibuprofen, Iodine, Iodine-131, Magnesium citrate, Penicillamine, Penicillins, Sulfa antibiotics, and Sulfasalazine  ? ?Social History  ? ?Socioeconomic History  ? Marital status: Widowed  ?  Spouse name: Not  on file  ? Number of children: Not on file  ? Years of education: Not on file  ? Highest education level: Not on file  ?Occupational History  ? Not on file  ?Tobacco Use  ? Smoking status: Former  ?  Types: Cigarettes  ?  Quit date: 08/24/1993  ?  Years since quitting: 27.8  ? Smokeless tobacco: Never  ?Substance and Sexual Activity  ? Alcohol use: Yes  ?  Comment: occasional  ? Drug use: Never  ? Sexual activity: Not on file  ?Other Topics Concern  ? Not on file  ?Social History Narrative  ? Not on file  ? ?Social Determinants of Health   ? ?Financial Resource Strain: Not on file  ?Food Insecurity: Not on file  ?Transportation Needs: Not on file  ?Physical Activity: Not on file  ?Stress: Not on file  ?Social Connections: Not on file  ?  ? ?Family History: ?The patient's family history includes Cancer in her brother; Osteoarthritis in her mother; Other in her father; Rheum arthritis in her mother. ?ROS:   ?Please see the history of present illness.    ?All 14 point review of systems negative except as described per history of present illness ? ?EKGs/Labs/Other Studies Reviewed:   ? ? ? ?Recent Labs: ?11/06/2020: BNP 241.4; Magnesium 2.1 ?06/23/2021: ALT 14; BUN 16; Creatinine, Ser 1.23; Hemoglobin 12.7; NT-Pro BNP 30,320; Platelets 230; Potassium 4.8; Sodium 145  ?Recent Lipid Panel ?   ?Component Value Date/Time  ? CHOL 199 11/10/2020 1127  ? TRIG 223 (H) 11/10/2020 1127  ? HDL 41 11/10/2020 1127  ? CHOLHDL 4.9 (H) 11/10/2020 1127  ? LDLCALC 119 (H) 11/10/2020 1127  ? ? ?Physical Exam:   ? ?VS:  BP 128/76 (BP Location: Left Arm, Patient Position: Sitting)   Pulse 65   Ht '5\' 1"'$  (1.549 m)   Wt 173 lb 3.2 oz (78.6 kg)   SpO2 95%   BMI 32.73 kg/m?    ? ?Wt Readings from Last 3 Encounters:  ?06/30/21 173 lb 3.2 oz (78.6 kg)  ?06/23/21 178 lb 3.2 oz (80.8 kg)  ?06/23/21 177 lb 11.2 oz (80.6 kg)  ?  ? ?GEN:  Well nourished, well developed in no acute distress ?HEENT: Normal ?NECK: No JVD; No carotid bruits ?LYMPHATICS: No lymphadenopathy ?CARDIAC: RRR, no murmurs, no rubs, no gallops ?RESPIRATORY:  Clear to auscultation without rales, wheezing or rhonchi  ?ABDOMEN: Soft, non-tender, non-distended ?MUSCULOSKELETAL:  No edema; No deformity  ?SKIN: Warm and dry ?LOWER EXTREMITIES: no swelling ?NEUROLOGIC:  Alert and oriented x 3 ?PSYCHIATRIC:  Normal affect  ? ?ASSESSMENT:   ? ?1. HFrEF (heart failure with reduced ejection fraction) ([place])   ?2. Palpitations   ? ?PLAN:   ? ?In order of problems listed above: ? ?Decompensated congestive heart failure.   Overall getting better but still a bit decompensated.  We will continue present dose of medications.  I will check her Chem-7 today.  Her last proBNP was more than 30,000. ?Palpitations: Denies having any. ?History of lymphoma B-cell lymphoma doing well from that point review stable, ?Overall she is getting better her congestive heart failure still decompensated but getting better we spent a Stephens of time talking to her and to her daughter about dietary issues and mistake that she made hopefully she will be fine. ? ? ?Medication Adjustments/Labs and Tests Ordered: ?Current medicines are reviewed at length with the patient today.  Concerns regarding medicines are outlined above.  ?No orders of the defined  types were placed in this encounter. ? ?Medication changes: No orders of the defined types were placed in this encounter. ? ? ?Signed, ?Park Liter, MD, [facility] ?06/30/2021 2:41 PM    ?[place] ? ?

## 2021-06-30 NOTE — Addendum Note (Signed)
Addended by: Jacobo Forest D on: 06/30/2021 02:47 PM ? ? Modules accepted: Orders ? ?

## 2021-07-01 LAB — BASIC METABOLIC PANEL
BUN/Creatinine Ratio: 20 (ref 12–28)
BUN: 29 mg/dL — ABNORMAL HIGH (ref 8–27)
CO2: 27 mmol/L (ref 20–29)
Calcium: 9.3 mg/dL (ref 8.7–10.3)
Chloride: 101 mmol/L (ref 96–106)
Creatinine, Ser: 1.44 mg/dL — ABNORMAL HIGH (ref 0.57–1.00)
Glucose: 77 mg/dL (ref 70–99)
Potassium: 4 mmol/L (ref 3.5–5.2)
Sodium: 146 mmol/L — ABNORMAL HIGH (ref 134–144)
eGFR: 36 mL/min/{1.73_m2} — ABNORMAL LOW (ref >59)

## 2021-07-29 ENCOUNTER — Encounter: Payer: Self-pay | Admitting: Cardiology

## 2021-07-29 ENCOUNTER — Ambulatory Visit (INDEPENDENT_AMBULATORY_CARE_PROVIDER_SITE_OTHER): Payer: Medicare Other | Admitting: Cardiology

## 2021-07-29 VITALS — BP 100/62 | HR 80 | Ht 61.0 in | Wt 174.8 lb

## 2021-07-29 DIAGNOSIS — C8339 Diffuse large B-cell lymphoma, extranodal and solid organ sites: Secondary | ICD-10-CM

## 2021-07-29 DIAGNOSIS — I428 Other cardiomyopathies: Secondary | ICD-10-CM

## 2021-07-29 DIAGNOSIS — E785 Hyperlipidemia, unspecified: Secondary | ICD-10-CM | POA: Diagnosis not present

## 2021-07-29 DIAGNOSIS — R002 Palpitations: Secondary | ICD-10-CM | POA: Diagnosis not present

## 2021-07-29 DIAGNOSIS — I5022 Chronic systolic (congestive) heart failure: Secondary | ICD-10-CM

## 2021-07-29 DIAGNOSIS — R0609 Other forms of dyspnea: Secondary | ICD-10-CM

## 2021-07-29 NOTE — Addendum Note (Signed)
Addended by: Jacobo Forest D on: 07/29/2021 01:33 PM   Modules accepted: Orders

## 2021-07-29 NOTE — Progress Notes (Signed)
Cardiology Office Note:    Date:  07/29/2021   ID:  Alexis Stephens, DOB 02/10/1936, MRN [id number]  PCP:  Alexis Sheriff, MD  Cardiologist:  Alexis Campus, MD    Referring MD: Alexis Sheriff, MD   No chief complaint on file.   History of Present Illness:    Alexis Stephens is a 86 y.o. female past medical history significant for cardiomyopathy with severely reduced left ventricle ejection fraction, she refused ICD, there is also some issue with noncompliance.  Her cardiomyopathy is nonischemic.  We had some difficulty getting her into the right medication she was playing with her medication and herself also was making a lot of dietary mistakes including eating [place] salty food.  Now things are much better and she looks better and feels better.  Described to have some exertional shortness of breath but overall not bad  Past Medical History:  Diagnosis Date   AKI (acute kidney injury) ([place]) 10/30/2020   Anaphylaxis due to hymenoptera venom 10/30/2014   B12 deficiency 06/23/2021   BMI 35.0-35.9,adult 02/10/2016   Cancer ([place])    Stage 4 Non-Hodgkin Lymphoma, clear for the past year   Cataract    Diffuse large B cell lymphoma ([place]) 04/09/2020   Dyslipidemia 10/19/2019   GERD (gastroesophageal reflux disease)    HFrEF (heart failure with reduced ejection fraction) ([place]) 11/25/2017   Hymenoptera allergy    Left hip pain 02/10/2016   Lymphadenopathy, inguinal 02/10/2016   Nonischemic cardiomyopathy ([place]) 11/25/2017   Overgrown toenails 09/21/2019   Palpitations 10/30/2015   Postoperative examination 02/17/2016   Swelling    Tachycardia 10/30/2015    Past Surgical History:  Procedure Laterality Date   ABDOMINAL HYSTERECTOMY     ADENOIDECTOMY     APPENDECTOMY     CHOLECYSTECTOMY     EYE SURGERY     NEPHROLITHOTOMY Left    TONSILLECTOMY      Current Medications: Current Meds  Medication Sig   acetaminophen (TYLENOL) 325 MG tablet Take  650 mg by mouth every 6 (six) hours as needed for mild pain, fever or headache.   cyclobenzaprine (FLEXERIL) 10 MG tablet Take 10 mg by mouth 3 (three) times daily as needed for muscle spasms.   EPINEPHrine 0.3 mg/0.3 mL IJ SOAJ injection Inject 0.3 mg into the muscle as needed for anaphylaxis.   furosemide (LASIX) 80 MG tablet Take 1 tablet (80 mg total) by mouth 2 (two) times daily.   losartan (COZAAR) 50 MG tablet Take 1 tablet (50 mg total) by mouth daily.   metoprolol tartrate (LOPRESSOR) 25 MG tablet Take 12.5 mg by mouth 2 (two) times daily.   Multiple Vitamin (MULTIVITAMIN) capsule Take 1 capsule by mouth daily. Unknown strength     Allergies:   Ivp dye [iodinated contrast media], Mixed vespid venom, Wasp venom, Bee venom, Furadantin [nitrofurantoin], Ibuprofen, Iodine, Iodine-131, Magnesium citrate, Penicillamine, Penicillins, Sulfa antibiotics, and Sulfasalazine   Social History   Socioeconomic History   Marital status: Widowed    Spouse name: Not on file   Number of children: Not on file   Years of education: Not on file   Highest education level: Not on file  Occupational History   Not on file  Tobacco Use   Smoking status: Former    Types: Cigarettes    Quit date: 08/24/1993    Years since quitting: 27.9   Smokeless tobacco: Never  Substance and Sexual Activity   Alcohol use: Yes  Comment: occasional   Drug use: Never   Sexual activity: Not on file  Other Topics Concern   Not on file  Social History Narrative   Not on file   Social Determinants of Health   Financial Resource Strain: Not on file  Food Insecurity: Not on file  Transportation Needs: Not on file  Physical Activity: Not on file  Stress: Not on file  Social Connections: Not on file     Family History: The patient's family history includes Cancer in her brother; Osteoarthritis in her mother; Other in her father; Rheum arthritis in her mother. ROS:   Please see the history of present illness.     All 14 point review of systems negative except as described per history of present illness  EKGs/Labs/Other Studies Reviewed:      Recent Labs: 11/06/2020: BNP 241.4; Magnesium 2.1 06/23/2021: ALT 14; Hemoglobin 12.7; NT-Pro BNP 30,320; Platelets 230 06/30/2021: BUN 29; Creatinine, Ser 1.44; Potassium 4.0; Sodium 146  Recent Lipid Panel    Component Value Date/Time   CHOL 199 11/10/2020 1127   TRIG 223 (H) 11/10/2020 1127   HDL 41 11/10/2020 1127   CHOLHDL 4.9 (H) 11/10/2020 1127   LDLCALC 119 (H) 11/10/2020 1127    Physical Exam:    VS:  BP 100/62   Pulse 80   Ht '5\' 1"'$  (1.549 m)   Wt 174 lb 12.8 oz (79.3 kg)   SpO2 92%   BMI 33.03 kg/m     Wt Readings from Last 3 Encounters:  07/29/21 174 lb 12.8 oz (79.3 kg)  06/30/21 173 lb 3.2 oz (78.6 kg)  06/23/21 178 lb 3.2 oz (80.8 kg)     GEN:  Well nourished, well developed in no acute distress HEENT: Normal NECK: No JVD; No carotid bruits LYMPHATICS: No lymphadenopathy CARDIAC: RRR, no murmurs, no rubs, no gallops RESPIRATORY:  Clear to auscultation without rales, wheezing or rhonchi  ABDOMEN: Soft, non-tender, non-distended MUSCULOSKELETAL:  No edema; No deformity  SKIN: Warm and dry LOWER EXTREMITIES: no swelling NEUROLOGIC:  Alert and oriented x 3 PSYCHIATRIC:  Normal affect   ASSESSMENT:    1. Nonischemic cardiomyopathy (HCC)   2. Palpitations   3. Dyslipidemia   4. Diffuse large B-cell lymphoma of extranodal site excluding spleen and other solid organs (HCC)    PLAN:    In order of problems listed above:  Cardiomyopathy which is nonischemic, 20 we will try again was directed medical therapy issue is price is she cannot afford Entresto, second issue is compliance, third issue is also blood pressure being low.  Today she appears to be compensated minimal swelling of lower extremities.  I will check proBNP and Chem-7. Palpitations denies having any. Dyslipidemia, I did review K PN which show me her LDL of 119  HDL 41 this is from September of last year I will make arrangements for her cholesterol to be rechecked we will do direct LDL today. Diffuse large B-cell lymphoma stable, followed by oncology   Medication Adjustments/Labs and Tests Ordered: Current medicines are reviewed at length with the patient today.  Concerns regarding medicines are outlined above.  No orders of the defined types were placed in this encounter.  Medication changes: No orders of the defined types were placed in this encounter.   Signed, Alexis Liter, MD, [facility] 07/29/2021 1:20 PM    [place]

## 2021-07-29 NOTE — Patient Instructions (Signed)
Medication Instructions:  Your physician recommends that you continue on your current medications as directed. Please refer to the Current Medication list given to you today.  *If you need a refill on your cardiac medications before your next appointment, please call your pharmacy*   Lab Work: CMP, ProBNP today If you have labs (blood work) drawn today and your tests are completely normal, you will receive your results only by: [place] (if you have MyChart) OR A paper copy in the mail If you have any lab test that is abnormal or we need to change your treatment, we will call you to review the results.   Testing/Procedures: None Ordered   Follow-Up: At [facility], you and your health needs are our priority.  As part of our continuing mission to provide you with exceptional heart care, we have created designated Provider Care Teams.  These Care Teams include your primary Cardiologist (physician) and Advanced Practice Providers (APPs -  Physician Assistants and Nurse Practitioners) who all work together to provide you with the care you need, when you need it.  We recommend signing up for the patient portal called "MyChart".  Sign up information is provided on this After Visit Summary.  MyChart is used to connect with patients for Virtual Visits (Telemedicine).  Patients are able to view lab/test results, encounter notes, upcoming appointments, etc.  Non-urgent messages can be sent to your provider as well.   To learn more about what you can do with MyChart, go to [email or web address].    Your next appointment:   3 month(s)  The format for your next appointment:   In Person  Provider:   [name], MD    Other Instructions NA

## 2021-07-30 LAB — COMPREHENSIVE METABOLIC PANEL
ALT: 6 IU/L (ref 0–32)
AST: 17 IU/L (ref 0–40)
Albumin/Globulin Ratio: 1.4 (ref 1.2–2.2)
Albumin: 3.9 g/dL (ref 3.6–4.6)
Alkaline Phosphatase: 46 IU/L (ref 44–121)
BUN/Creatinine Ratio: 14 (ref 12–28)
BUN: 15 mg/dL (ref 8–27)
Bilirubin Total: 0.6 mg/dL (ref 0.0–1.2)
CO2: 19 mmol/L — ABNORMAL LOW (ref 20–29)
Calcium: 9.1 mg/dL (ref 8.7–10.3)
Chloride: 108 mmol/L — ABNORMAL HIGH (ref 96–106)
Creatinine, Ser: 1.11 mg/dL — ABNORMAL HIGH (ref 0.57–1.00)
Globulin, Total: 2.7 g/dL (ref 1.5–4.5)
Glucose: 84 mg/dL (ref 70–99)
Potassium: 4.3 mmol/L (ref 3.5–5.2)
Sodium: 141 mmol/L (ref 134–144)
Total Protein: 6.6 g/dL (ref 6.0–8.5)
eGFR: 49 mL/min/{1.73_m2} — ABNORMAL LOW (ref >59)

## 2021-07-30 LAB — PRO B NATRIURETIC PEPTIDE: NT-Pro BNP: 20650 pg/mL — ABNORMAL HIGH (ref 0–738)

## 2021-08-17 ENCOUNTER — Ambulatory Visit (INDEPENDENT_AMBULATORY_CARE_PROVIDER_SITE_OTHER): Payer: Medicare Other | Admitting: *Deleted

## 2021-08-17 DIAGNOSIS — T63441D Toxic effect of venom of bees, accidental (unintentional), subsequent encounter: Secondary | ICD-10-CM

## 2021-10-12 ENCOUNTER — Ambulatory Visit (INDEPENDENT_AMBULATORY_CARE_PROVIDER_SITE_OTHER): Payer: Medicare Other | Admitting: *Deleted

## 2021-10-12 DIAGNOSIS — T63441D Toxic effect of venom of bees, accidental (unintentional), subsequent encounter: Secondary | ICD-10-CM | POA: Diagnosis not present

## 2021-10-19 ENCOUNTER — Other Ambulatory Visit: Payer: Self-pay | Admitting: Hematology and Oncology

## 2021-10-19 MED ORDER — CYCLOBENZAPRINE HCL 10 MG PO TABS: 10.0000 mg | ORAL_TABLET | Freq: Three times a day (TID) | ORAL | 3 refills | Status: DC | PRN

## 2021-10-30 ENCOUNTER — Encounter: Payer: Self-pay | Admitting: Cardiology

## 2021-10-30 ENCOUNTER — Ambulatory Visit: Payer: Medicare Other | Attending: Cardiology | Admitting: Cardiology

## 2021-10-30 VITALS — BP 112/72 | HR 52 | Ht 62.0 in | Wt 168.4 lb

## 2021-10-30 DIAGNOSIS — I428 Other cardiomyopathies: Secondary | ICD-10-CM | POA: Insufficient documentation

## 2021-10-30 DIAGNOSIS — K219 Gastro-esophageal reflux disease without esophagitis: Secondary | ICD-10-CM | POA: Insufficient documentation

## 2021-10-30 DIAGNOSIS — C8339 Diffuse large B-cell lymphoma, extranodal and solid organ sites: Secondary | ICD-10-CM | POA: Diagnosis not present

## 2021-10-30 DIAGNOSIS — R0609 Other forms of dyspnea: Secondary | ICD-10-CM | POA: Insufficient documentation

## 2021-10-30 DIAGNOSIS — I5020 Unspecified systolic (congestive) heart failure: Secondary | ICD-10-CM | POA: Insufficient documentation

## 2021-10-30 NOTE — Patient Instructions (Addendum)
Medication Instructions:  Your physician recommends that you continue on your current medications as directed. Please refer to the Current Medication list given to you today.  *If you need a refill on your cardiac medications before your next appointment, please call your pharmacy*   Lab Work: BMP, ProBNP- Today If you have labs (blood work) drawn today and your tests are completely normal, you will receive your results only by: [place] (if you have MyChart) OR A paper copy in the mail If you have any lab test that is abnormal or we need to change your treatment, we will call you to review the results.   Testing/Procedures: None Ordered   Follow-Up: At [facility], you and your health needs are our priority.  As part of our continuing mission to provide you with exceptional heart care, we have created designated Provider Care Teams.  These Care Teams include your primary Cardiologist (physician) and Advanced Practice Providers (APPs -  Physician Assistants and Nurse Practitioners) who all work together to provide you with the care you need, when you need it.  We recommend signing up for the patient portal called "MyChart".  Sign up information is provided on this After Visit Summary.  MyChart is used to connect with patients for Virtual Visits (Telemedicine).  Patients are able to view lab/test results, encounter notes, upcoming appointments, etc.  Non-urgent messages can be sent to your provider as well.   To learn more about what you can do with MyChart, go to [email or web address].    Your next appointment:   3 month(s)  The format for your next appointment:   In Person  Provider:   [name], MD    Other Instructions NA

## 2021-10-30 NOTE — Addendum Note (Signed)
Addended by: Jacobo Forest D on: 10/30/2021 01:53 PM   Modules accepted: Orders

## 2021-10-30 NOTE — Progress Notes (Signed)
Cardiology Office Note:    Date:  10/30/2021   ID:  Alexis Stephens Alexis Stephens, DOB 17-Feb-1936, MRN [id number]  PCP:  [name], MD  Cardiologist:  [name], MD    Referring MD: [name], MD   Chief Complaint  Patient presents with   Follow-up    Legs are swelling, red in appearance on the R foot    History of Present Illness:    Alexis Stephens is a 86 y.o. female with past medical history significant for cardiomyopathy with severely reduced left ventricle ejection fraction.  There are some difficulty putting her appropriate medications because of cost issue as well as some noncompliance.  She is coming today [name] for follow-up.  Overall she says she is feeling better she does have some swelling of lower extremities.  But overall seems to be doing well.  She feels better than last time.  Additional problems include palpitations, dyslipidemia, history of diffuse large B-cell lymphoma  Past Medical History:  Diagnosis Date   AKI (acute kidney injury) ([place]) 10/30/2020   Anaphylaxis due to hymenoptera venom 10/30/2014   B12 deficiency 06/23/2021   BMI 35.0-35.9,adult 02/10/2016   Cancer ([place])    Stage 4 Non-Hodgkin Lymphoma, clear for the past year   Cataract    Diffuse large B cell lymphoma ([place]) 04/09/2020   Dyslipidemia 10/19/2019   GERD (gastroesophageal reflux disease)    HFrEF (heart failure with reduced ejection fraction) ([place]) 11/25/2017   Hymenoptera allergy    Left hip pain 02/10/2016   Lymphadenopathy, inguinal 02/10/2016   Nonischemic cardiomyopathy ([place]) 11/25/2017   Overgrown toenails 09/21/2019   Palpitations 10/30/2015   Postoperative examination 02/17/2016   Swelling    Tachycardia 10/30/2015    Past Surgical History:  Procedure Laterality Date   ABDOMINAL HYSTERECTOMY     ADENOIDECTOMY     APPENDECTOMY     CHOLECYSTECTOMY     EYE SURGERY     NEPHROLITHOTOMY Left    TONSILLECTOMY      Current  Medications: Current Meds  Medication Sig   acetaminophen (TYLENOL) 325 MG tablet Take 650 mg by mouth every 6 (six) hours as needed for mild pain, fever or headache.   cyclobenzaprine (FLEXERIL) 10 MG tablet Take 1 tablet (10 mg total) by mouth 3 (three) times daily as needed for muscle spasms.   EPINEPHrine 0.3 mg/0.3 mL IJ SOAJ injection Inject 0.3 mg into the muscle as needed for anaphylaxis.   furosemide (LASIX) 80 MG tablet Take 1 tablet (80 mg total) by mouth 2 (two) times daily.   losartan (COZAAR) 50 MG tablet Take 1 tablet (50 mg total) by mouth daily.   metoprolol tartrate (LOPRESSOR) 25 MG tablet Take 12.5 mg by mouth 2 (two) times daily.   Multiple Vitamin (MULTIVITAMIN) capsule Take 1 capsule by mouth daily. Unknown strength     Allergies:   Ivp dye [iodinated contrast media], Mixed vespid venom, Wasp venom, Bee venom, Furadantin [nitrofurantoin], Ibuprofen, Iodine, Iodine-131, Magnesium citrate, Penicillamine, Penicillins, Sulfa antibiotics, and Sulfasalazine   Social History   Socioeconomic History   Marital status: Widowed    Spouse name: Not on file   Number of children: Not on file   Years of education: Not on file   Highest education level: Not on file  Occupational History   Not on file  Tobacco Use   Smoking status: Former    Types: Cigarettes    Quit date: 08/24/1993    Years since quitting:  28.2   Smokeless tobacco: Never  Substance and Sexual Activity   Alcohol use: Yes    Comment: occasional   Drug use: Never   Sexual activity: Not on file  Other Topics Concern   Not on file  Social History Narrative   Not on file   Social Determinants of Health   Financial Resource Strain: Not on file  Food Insecurity: Not on file  Transportation Needs: Not on file  Physical Activity: Not on file  Stress: Not on file  Social Connections: Not on file     Family History: The patient's family history includes Cancer in her brother; Osteoarthritis in her  mother; Other in her father; Rheum arthritis in her mother. ROS:   Please see the history of present illness.    All 14 point review of systems negative except as described per history of present illness  EKGs/Labs/Other Studies Reviewed:      Recent Labs: 11/06/2020: BNP 241.4; Magnesium 2.1 06/23/2021: Hemoglobin 12.7; Platelets 230 07/29/2021: ALT 6; BUN 15; Creatinine, Ser 1.11; NT-Pro BNP 20,650; Potassium 4.3; Sodium 141  Recent Lipid Panel    Component Value Date/Time   CHOL 199 11/10/2020 1127   TRIG 223 (H) 11/10/2020 1127   HDL 41 11/10/2020 1127   CHOLHDL 4.9 (H) 11/10/2020 1127   LDLCALC 119 (H) 11/10/2020 1127    Physical Exam:    VS:  BP 112/72 (BP Location: Left Arm, Patient Position: Sitting)   Pulse (!) 52   Ht '5\' 2"'$  (1.575 m)   Wt 168 lb 6.4 oz (76.4 kg)   SpO2 95%   BMI 30.80 kg/m     Wt Readings from Last 3 Encounters:  10/30/21 168 lb 6.4 oz (76.4 kg)  07/29/21 174 lb 12.8 oz (79.3 kg)  06/30/21 173 lb 3.2 oz (78.6 kg)     GEN:  Well nourished, well developed in no acute distress HEENT: Normal NECK: No JVD; No carotid bruits LYMPHATICS: No lymphadenopathy CARDIAC: RRR, no murmurs, no rubs, no gallops RESPIRATORY:  Clear to auscultation without rales, wheezing or rhonchi  ABDOMEN: Soft, non-tender, non-distended MUSCULOSKELETAL:  No edema; No deformity  SKIN: Warm and dry LOWER EXTREMITIES: no swelling NEUROLOGIC:  Alert and oriented x 3 PSYCHIATRIC:  Normal affect   ASSESSMENT:    1. Nonischemic cardiomyopathy (HCC)   2. HFrEF (heart failure with reduced ejection fraction) ([place])   3. Gastroesophageal reflux disease, unspecified whether esophagitis present   4. Diffuse large B-cell lymphoma of extranodal site excluding spleen and other solid organs (HCC)    PLAN:    In order of problems listed above:  Congestive heart failure mildly decompensated I will check proBNP and Chem-7.  She took extra dose of furosemide which I told her it is  okay to do from time to time but she will if she will require frequent extra dosages of Lasix need to let me know Nonischemic cardiomyopathy difficulty putting her on right medication because of some compliance of prices as well as kidney dysfunction we will check Chem-7 and decide if we can put her on a slightly higher dose of diuretic Gastroesophageal reflux disease: Denies having any Diffuse large B-cell lymphoma: Stable   Medication Adjustments/Labs and Tests Ordered: Current medicines are reviewed at length with the patient today.  Concerns regarding medicines are outlined above.  No orders of the defined types were placed in this encounter.  Medication changes: No orders of the defined types were placed in this encounter.   Signed, Herbie Baltimore  Synetta Fail, MD, [facility] 10/30/2021 1:43 PM    [place]

## 2021-10-31 LAB — PRO B NATRIURETIC PEPTIDE: NT-Pro BNP: 22916 pg/mL — ABNORMAL HIGH (ref 0–738)

## 2021-10-31 LAB — BASIC METABOLIC PANEL
BUN/Creatinine Ratio: 22 (ref 12–28)
BUN: 39 mg/dL — ABNORMAL HIGH (ref 8–27)
CO2: 24 mmol/L (ref 20–29)
Calcium: 8.6 mg/dL — ABNORMAL LOW (ref 8.7–10.3)
Chloride: 100 mmol/L (ref 96–106)
Creatinine, Ser: 1.76 mg/dL — ABNORMAL HIGH (ref 0.57–1.00)
Glucose: 91 mg/dL (ref 70–99)
Potassium: 3.8 mmol/L (ref 3.5–5.2)
Sodium: 141 mmol/L (ref 134–144)
eGFR: 28 mL/min/{1.73_m2} — ABNORMAL LOW (ref >59)

## 2021-11-03 ENCOUNTER — Telehealth: Payer: Self-pay

## 2021-11-03 ENCOUNTER — Other Ambulatory Visit: Payer: Self-pay

## 2021-11-03 DIAGNOSIS — I5022 Chronic systolic (congestive) heart failure: Secondary | ICD-10-CM

## 2021-11-03 DIAGNOSIS — R0609 Other forms of dyspnea: Secondary | ICD-10-CM

## 2021-11-03 MED ORDER — METOPROLOL TARTRATE 25 MG PO TABS: 12.5000 mg | ORAL_TABLET | Freq: Two times a day (BID) | ORAL | 3 refills | Status: DC

## 2021-11-03 NOTE — Addendum Note (Signed)
Addended by: Jacobo Forest D on: 11/03/2021 02:23 PM   Modules accepted: Orders

## 2021-11-03 NOTE — Telephone Encounter (Signed)
Spoke with pt about lab results. Per Dr. Wendy Poet note pt agreed to increase Lasix by '40mg'$  to '120mg'$  in the morning and '80mg'$  in the afternoon. She will come in in 1 week for a BMP.

## 2021-11-05 DIAGNOSIS — H40013 Open angle with borderline findings, low risk, bilateral: Secondary | ICD-10-CM | POA: Diagnosis not present

## 2021-11-11 ENCOUNTER — Other Ambulatory Visit: Payer: Self-pay

## 2021-11-11 DIAGNOSIS — R0609 Other forms of dyspnea: Secondary | ICD-10-CM | POA: Diagnosis not present

## 2021-11-11 DIAGNOSIS — I5022 Chronic systolic (congestive) heart failure: Secondary | ICD-10-CM | POA: Diagnosis not present

## 2021-11-12 LAB — BASIC METABOLIC PANEL
BUN/Creatinine Ratio: 18 (ref 12–28)
BUN: 29 mg/dL — ABNORMAL HIGH (ref 8–27)
CO2: 23 mmol/L (ref 20–29)
Calcium: 8.6 mg/dL — ABNORMAL LOW (ref 8.7–10.3)
Chloride: 100 mmol/L (ref 96–106)
Creatinine, Ser: 1.62 mg/dL — ABNORMAL HIGH (ref 0.57–1.00)
Glucose: 88 mg/dL (ref 70–99)
Potassium: 3.7 mmol/L (ref 3.5–5.2)
Sodium: 141 mmol/L (ref 134–144)
eGFR: 31 mL/min/{1.73_m2} — ABNORMAL LOW (ref >59)

## 2021-11-13 ENCOUNTER — Other Ambulatory Visit: Payer: Self-pay

## 2021-11-13 ENCOUNTER — Telehealth: Payer: Self-pay

## 2021-11-13 NOTE — Telephone Encounter (Signed)
Patient called the office and wanted to clarify her Furosemide dosage. The dosage of furosemide was clarified with the patient and she had no further questions at this time.

## 2021-11-16 ENCOUNTER — Telehealth: Payer: Self-pay

## 2021-11-16 NOTE — Telephone Encounter (Signed)
Results reviewed with pt as per Dr. Krasowski's note.  Pt verbalized understanding and had no additional questions. Routed to PCP  

## 2021-12-07 ENCOUNTER — Ambulatory Visit: Payer: Medicare Other

## 2021-12-14 ENCOUNTER — Ambulatory Visit (INDEPENDENT_AMBULATORY_CARE_PROVIDER_SITE_OTHER): Payer: Medicare Other | Admitting: *Deleted

## 2021-12-14 DIAGNOSIS — T63441D Toxic effect of venom of bees, accidental (unintentional), subsequent encounter: Secondary | ICD-10-CM

## 2022-01-29 ENCOUNTER — Encounter: Payer: Self-pay | Admitting: Cardiology

## 2022-01-29 ENCOUNTER — Telehealth: Payer: Self-pay | Admitting: *Deleted

## 2022-01-29 ENCOUNTER — Ambulatory Visit: Payer: Medicare Other | Attending: Cardiology | Admitting: Cardiology

## 2022-01-29 VITALS — BP 110/62 | HR 75 | Ht 62.0 in | Wt 169.4 lb

## 2022-01-29 DIAGNOSIS — R0683 Snoring: Secondary | ICD-10-CM | POA: Insufficient documentation

## 2022-01-29 DIAGNOSIS — I5020 Unspecified systolic (congestive) heart failure: Secondary | ICD-10-CM | POA: Diagnosis not present

## 2022-01-29 DIAGNOSIS — R609 Edema, unspecified: Secondary | ICD-10-CM | POA: Diagnosis not present

## 2022-01-29 DIAGNOSIS — I428 Other cardiomyopathies: Secondary | ICD-10-CM | POA: Insufficient documentation

## 2022-01-29 DIAGNOSIS — E785 Hyperlipidemia, unspecified: Secondary | ICD-10-CM | POA: Diagnosis not present

## 2022-01-29 DIAGNOSIS — C8339 Diffuse large B-cell lymphoma, extranodal and solid organ sites: Secondary | ICD-10-CM | POA: Insufficient documentation

## 2022-01-29 NOTE — Patient Instructions (Signed)
Medication Instructions:  Your physician recommends that you continue on your current medications as directed. Please refer to the Current Medication list given to you today.  *If you need a refill on your cardiac medications before your next appointment, please call your pharmacy*   Lab Work: CMP, ProBNP-Today If you have labs (blood work) drawn today and your tests are completely normal, you will receive your results only by: [place] (if you have MyChart) OR A paper copy in the mail If you have any lab test that is abnormal or we need to change your treatment, we will call you to review the results.   Testing/Procedures: Itamar sleep study- follow directions on handout   Follow-Up: At [facility], you and your health needs are our priority.  As part of our continuing mission to provide you with exceptional heart care, we have created designated Provider Care Teams.  These Care Teams include your primary Cardiologist (physician) and Advanced Practice Providers (APPs -  Physician Assistants and Nurse Practitioners) who all work together to provide you with the care you need, when you need it.  We recommend signing up for the patient portal called "MyChart".  Sign up information is provided on this After Visit Summary.  MyChart is used to connect with patients for Virtual Visits (Telemedicine).  Patients are able to view lab/test results, encounter notes, upcoming appointments, etc.  Non-urgent messages can be sent to your provider as well.   To learn more about what you can do with MyChart, go to [email or web address].    Your next appointment:   3 month(s)  The format for your next appointment:   In Person  Provider:   [name], MD    Other Instructions NA

## 2022-01-29 NOTE — Progress Notes (Signed)
Cardiology Office Note:    Date:  01/29/2022   ID:  Alexis Stephens, DOB 07/16/1935, MRN [id number]  PCP:  [name], MD  Cardiologist:  [name], MD    Referring MD: [name], MD   Chief Complaint  Patient presents with   Leg Swelling   Shortness of Breath    History of Present Illness:    Alexis Stephens is a 86 y.o. female with past medical history significant for cardiomyopathy which is nonischemic with severely reduced left ventricle ejection fraction, she have difficulty tolerating some medications also some financial issues with unable to afford Entresto, she does not have any prescription plan, also non-Hodgkin's lymphoma, comes today 2 months for follow-up she said majority of time she is doing fine.  She does have some swelling of lower extremities To take some different waiting for Lasix she actually takes Lasix to 4 times a day 40 mg 4 times daily, described also situation but sometimes in the middle of night she wakes up gasping for air but does not look like paroxysmal nocturnal dyspnea, I am worried that she may be having obstructive sleep apnea.  I will ask her to have sleep study done denies have any chest pain tightness squeezing pressure burning chest  Past Medical History:  Diagnosis Date   AKI (acute kidney injury) ([place]) 10/30/2020   Anaphylaxis due to hymenoptera venom 10/30/2014   B12 deficiency 06/23/2021   BMI 35.0-35.9,adult 02/10/2016   Cancer ([place])    Stage 4 Non-Hodgkin Lymphoma, clear for the past year   Cataract    Diffuse large B cell lymphoma ([place]) 04/09/2020   Dyslipidemia 10/19/2019   GERD (gastroesophageal reflux disease)    HFrEF (heart failure with reduced ejection fraction) ([place]) 11/25/2017   Hymenoptera allergy    Left hip pain 02/10/2016   Lymphadenopathy, inguinal 02/10/2016   Nonischemic cardiomyopathy ([place]) 11/25/2017   Overgrown toenails 09/21/2019   Palpitations 10/30/2015    Postoperative examination 02/17/2016   Swelling    Tachycardia 10/30/2015    Past Surgical History:  Procedure Laterality Date   ABDOMINAL HYSTERECTOMY     ADENOIDECTOMY     APPENDECTOMY     CHOLECYSTECTOMY     EYE SURGERY     NEPHROLITHOTOMY Left    TONSILLECTOMY      Current Medications: Current Meds  Medication Sig   acetaminophen (TYLENOL) 325 MG tablet Take 650 mg by mouth every 6 (six) hours as needed for mild pain, fever or headache.   cyclobenzaprine (FLEXERIL) 10 MG tablet Take 1 tablet (10 mg total) by mouth 3 (three) times daily as needed for muscle spasms.   EPINEPHrine 0.3 mg/0.3 mL IJ SOAJ injection Inject 0.3 mg into the muscle as needed for anaphylaxis.   furosemide (LASIX) 80 MG tablet Take 1 tablet (80 mg total) by mouth 2 (two) times daily.   losartan (COZAAR) 50 MG tablet Take 1 tablet (50 mg total) by mouth daily.   metoprolol tartrate (LOPRESSOR) 25 MG tablet Take 0.5 tablets (12.5 mg total) by mouth 2 (two) times daily.   Multiple Vitamin (MULTIVITAMIN) capsule Take 1 capsule by mouth daily. Unknown strength     Allergies:   Ivp dye [iodinated contrast media], Mixed vespid venom, Wasp venom, Bee venom, Furadantin [nitrofurantoin], Iodine, Iodine-131, Magnesium citrate, Penicillamine, Penicillins, Sulfa antibiotics, and Sulfasalazine   Social History   Socioeconomic History   Marital status: Widowed    Spouse name: Not on file   Number  of children: Not on file   Years of education: Not on file   Highest education level: Not on file  Occupational History   Not on file  Tobacco Use   Smoking status: Former    Types: Cigarettes    Quit date: 08/24/1993    Years since quitting: 28.4   Smokeless tobacco: Never  Substance and Sexual Activity   Alcohol use: Yes    Comment: occasional   Drug use: Never   Sexual activity: Not on file  Other Topics Concern   Not on file  Social History Narrative   Not on file   Social Determinants of Health    Financial Resource Strain: Not on file  Food Insecurity: Not on file  Transportation Needs: Not on file  Physical Activity: Not on file  Stress: Not on file  Social Connections: Not on file     Family History: The patient's family history includes Cancer in her brother; Osteoarthritis in her mother; Other in her father; Rheum arthritis in her mother. ROS:   Please see the history of present illness.    All 14 point review of systems negative except as described per history of present illness  EKGs/Labs/Other Studies Reviewed:      Recent Labs: 06/23/2021: Hemoglobin 12.7; Platelets 230 07/29/2021: ALT 6 10/30/2021: NT-Pro BNP 22,916 11/11/2021: BUN 29; Creatinine, Ser 1.62; Potassium 3.7; Sodium 141  Recent Lipid Panel    Component Value Date/Time   CHOL 199 11/10/2020 1127   TRIG 223 (H) 11/10/2020 1127   HDL 41 11/10/2020 1127   CHOLHDL 4.9 (H) 11/10/2020 1127   LDLCALC 119 (H) 11/10/2020 1127    Physical Exam:    VS:  BP 110/62 (BP Location: Left Arm, Patient Position: Sitting)   Pulse 75   Ht '5\' 2"'$  (1.575 m)   Wt 169 lb 6.4 oz (76.8 kg)   SpO2 94%   BMI 30.98 kg/m     Wt Readings from Last 3 Encounters:  01/29/22 169 lb 6.4 oz (76.8 kg)  10/30/21 168 lb 6.4 oz (76.4 kg)  07/29/21 174 lb 12.8 oz (79.3 kg)     GEN:  Well nourished, well developed in no acute distress HEENT: Normal NECK: No JVD; No carotid bruits LYMPHATICS: No lymphadenopathy CARDIAC: RRR, no murmurs, no rubs, no gallops RESPIRATORY:  Clear to auscultation without rales, wheezing or rhonchi  ABDOMEN: Soft, non-tender, non-distended MUSCULOSKELETAL:  No edema; No deformity  SKIN: Warm and dry LOWER EXTREMITIES: 1+ swelling NEUROLOGIC:  Alert and oriented x 3 PSYCHIATRIC:  Normal affect   ASSESSMENT:    1. HFrEF (heart failure with reduced ejection fraction) ([place])   2. Nonischemic cardiomyopathy ([place])   3. Diffuse large B-cell lymphoma of extranodal site excluding spleen and other  solid organs ([place])   4. Dyslipidemia   5. Swelling    PLAN:    In order of problems listed above:  Congestive heart failure reduced left ventricle ejection fraction difficulty tolerating medication because of kidney dysfunction and low blood pressure as well as prices.  Will try to enroll her into gaining our [facility] program and see if we can provide her with samples of the medications. Nonischemic cardiomyopathy: Discussion as above. Diffuse large B-cell lymphoma.  Followed by antimedicine team and oncology. Snoring class some shortness of breath at night.  It could be PND however the way she described to me today look more like obstructive sleep apnea.  I will schedule her to have sleep study done.  I am  more about hiring potentially central sleep apnea. Very complicated situation diminished ejection fraction difficulty taking medications difficulty affording medications.  I will try to do everything I can to improved quality of life if she 20 sleep apnea that may help.   Medication Adjustments/Labs and Tests Ordered: Current medicines are reviewed at length with the patient today.  Concerns regarding medicines are outlined above.  No orders of the defined types were placed in this encounter.  Medication changes: No orders of the defined types were placed in this encounter.   Signed, Park Liter, MD, [facility] 01/29/2022 1:43 PM    [facility]

## 2022-01-29 NOTE — Telephone Encounter (Signed)
Notified Jacobo Forest ok to activate itamar device.

## 2022-01-29 NOTE — Addendum Note (Signed)
Addended by: Jacobo Forest D on: 01/29/2022 01:54 PM   Modules accepted: Orders

## 2022-01-30 LAB — PRO B NATRIURETIC PEPTIDE: NT-Pro BNP: 47149 pg/mL — ABNORMAL HIGH (ref 0–738)

## 2022-01-30 LAB — COMPREHENSIVE METABOLIC PANEL
ALT: 2 IU/L (ref 0–32)
AST: 16 IU/L (ref 0–40)
Albumin/Globulin Ratio: 1.5 (ref 1.2–2.2)
Albumin: 4.1 g/dL (ref 3.7–4.7)
Alkaline Phosphatase: 55 IU/L (ref 44–121)
BUN/Creatinine Ratio: 15 (ref 12–28)
BUN: 27 mg/dL (ref 8–27)
Bilirubin Total: 0.5 mg/dL (ref 0.0–1.2)
CO2: 21 mmol/L (ref 20–29)
Calcium: 9.4 mg/dL (ref 8.7–10.3)
Chloride: 104 mmol/L (ref 96–106)
Creatinine, Ser: 1.79 mg/dL — ABNORMAL HIGH (ref 0.57–1.00)
Globulin, Total: 2.8 g/dL (ref 1.5–4.5)
Glucose: 84 mg/dL (ref 70–99)
Potassium: 5.4 mmol/L — ABNORMAL HIGH (ref 3.5–5.2)
Sodium: 142 mmol/L (ref 134–144)
Total Protein: 6.9 g/dL (ref 6.0–8.5)
eGFR: 28 mL/min/{1.73_m2} — ABNORMAL LOW (ref >59)

## 2022-02-03 ENCOUNTER — Telehealth: Payer: Self-pay

## 2022-02-03 DIAGNOSIS — I5020 Unspecified systolic (congestive) heart failure: Secondary | ICD-10-CM

## 2022-02-03 MED ORDER — METOLAZONE 2.5 MG PO TABS: ORAL_TABLET | ORAL | 3 refills | Status: DC

## 2022-02-03 NOTE — Telephone Encounter (Signed)
-----   Message from Park Liter, MD sent at 02/01/2022 10:29 AM EST ----- Congestive heart failure getting worse, please add Zaroxolyn 2.5 twice a week, Chem-7 need to be done within a week

## 2022-02-04 NOTE — Telephone Encounter (Signed)
Pt is returning call. Requesting return call.  

## 2022-02-04 NOTE — Telephone Encounter (Signed)
Spoke with pt who was upset that the pharmacist called her to tell her that a RX was ready for pickup. I advised her that I had called her times 2 and sent her a MyChart message with results and the need for a new medication for her heart failure as it was getting worse. I had charted incorrectly on 12/6 that I left a VM when in turn she did not have a VM which was noted correctly on 12/7. Robin from [facility] also talked with pt and advised that she needed to come see her next week for repeat labs to see how they are after staring the new medication. Pt verbalized understanding and had no additional questions.

## 2022-02-08 ENCOUNTER — Ambulatory Visit: Payer: Medicare Other

## 2022-02-15 ENCOUNTER — Ambulatory Visit: Payer: Medicare Other

## 2022-02-17 NOTE — Telephone Encounter (Signed)
Pt is calling today in regards to labs she was suppose to do after starting medication change. She states that she is sick and does not think it would be good to come in and take labs.  Requesting call back to further instructions for labs. Please advise.

## 2022-02-17 NOTE — Telephone Encounter (Signed)
Pt states that she has a scratchy throat and congestion. Pt advised we will be open til Friday and if feeling better she can wear a mask and come in for her labs. Pt verbalized understanding and had no additional questions.

## 2022-02-18 NOTE — Telephone Encounter (Signed)
Pt called back stating she will come next week because she still isn't feeling better and wanted to let Dr. Raliegh Ip that she hasn't forgot about it. Informed her I will send message and she can come when she feels better.

## 2022-02-19 DIAGNOSIS — I5020 Unspecified systolic (congestive) heart failure: Secondary | ICD-10-CM | POA: Diagnosis not present

## 2022-02-19 LAB — BASIC METABOLIC PANEL
BUN/Creatinine Ratio: 23 (ref 12–28)
BUN: 33 mg/dL — ABNORMAL HIGH (ref 8–27)
CO2: 23 mmol/L (ref 20–29)
Calcium: 9.6 mg/dL (ref 8.7–10.3)
Chloride: 101 mmol/L (ref 96–106)
Creatinine, Ser: 1.42 mg/dL — ABNORMAL HIGH (ref 0.57–1.00)
Glucose: 88 mg/dL (ref 70–99)
Potassium: 5 mmol/L (ref 3.5–5.2)
Sodium: 140 mmol/L (ref 134–144)
eGFR: 36 mL/min/{1.73_m2} — ABNORMAL LOW (ref >59)

## 2022-02-19 NOTE — Telephone Encounter (Signed)
error 

## 2022-03-02 ENCOUNTER — Telehealth: Payer: Self-pay

## 2022-03-02 NOTE — Telephone Encounter (Signed)
Patient notified of results.

## 2022-03-02 NOTE — Telephone Encounter (Signed)
-----   Message from Park Liter, MD sent at 02/25/2022  9:31 PM EST ----- Kidney function improved significantly.  Continue present management.  Potassium is normal.

## 2022-03-31 ENCOUNTER — Telehealth: Payer: Self-pay | Admitting: *Deleted

## 2022-03-31 NOTE — Telephone Encounter (Signed)
Secure chat sent to Alexis Stephens to contact the patient to return the itamar device or be billed.

## 2022-05-24 ENCOUNTER — Encounter: Payer: Self-pay | Admitting: Cardiology

## 2022-05-24 NOTE — Telephone Encounter (Signed)
Error

## 2022-05-25 ENCOUNTER — Telehealth: Payer: Self-pay

## 2022-05-25 ENCOUNTER — Telehealth: Payer: Self-pay | Admitting: Cardiology

## 2022-05-25 ENCOUNTER — Encounter: Payer: Self-pay | Admitting: Cardiology

## 2022-05-25 ENCOUNTER — Ambulatory Visit: Payer: Medicare Other | Attending: Cardiology | Admitting: Cardiology

## 2022-05-25 VITALS — BP 104/72 | HR 118 | Ht 62.0 in | Wt 172.0 lb

## 2022-05-25 DIAGNOSIS — R0609 Other forms of dyspnea: Secondary | ICD-10-CM | POA: Diagnosis not present

## 2022-05-25 DIAGNOSIS — C8339 Diffuse large B-cell lymphoma, extranodal and solid organ sites: Secondary | ICD-10-CM | POA: Insufficient documentation

## 2022-05-25 DIAGNOSIS — N179 Acute kidney failure, unspecified: Secondary | ICD-10-CM | POA: Diagnosis not present

## 2022-05-25 DIAGNOSIS — I483 Typical atrial flutter: Secondary | ICD-10-CM | POA: Insufficient documentation

## 2022-05-25 DIAGNOSIS — I4892 Unspecified atrial flutter: Secondary | ICD-10-CM | POA: Insufficient documentation

## 2022-05-25 DIAGNOSIS — I5020 Unspecified systolic (congestive) heart failure: Secondary | ICD-10-CM | POA: Insufficient documentation

## 2022-05-25 DIAGNOSIS — I428 Other cardiomyopathies: Secondary | ICD-10-CM | POA: Diagnosis not present

## 2022-05-25 DIAGNOSIS — R609 Edema, unspecified: Secondary | ICD-10-CM | POA: Insufficient documentation

## 2022-05-25 DIAGNOSIS — E785 Hyperlipidemia, unspecified: Secondary | ICD-10-CM | POA: Diagnosis not present

## 2022-05-25 MED ORDER — FUROSEMIDE 80 MG PO TABS: ORAL_TABLET | ORAL | 3 refills | Status: DC

## 2022-05-25 NOTE — Telephone Encounter (Signed)
Pt called wanting to know if she should be on Zaroxolyn - Dr. Agustin Cree said we soul wait for blood work results tomorrow and decide then. Pt was agreeable and had no further questions.

## 2022-05-25 NOTE — Progress Notes (Signed)
Cardiology Office Note:    Date:  05/25/2022   ID:  [name] Anyha Tonn, DOB 1935/03/05, MRN [id number]  PCP:  [name], MD  Cardiologist:  [name], MD    Referring MD: [name], MD   No chief complaint on file. Short of breath to have swollen legs  History of Present Illness:    Alexis Stephens is a 87 y.o. female past medical history significant for nonischemic cardiomyopathy similarly reduced left ventricle ejection fraction, I had difficulty putting her on appropriate medication because of kidney dysfunction and some financial issues, she was unable to afford Entresto she does have history of non-Hodgkin's lymphoma as well as some noncompliance with diet that she has made admits that she is guilty.  She is a retired Marine scientist.  Comes today for follow-up since not being seen since December.  Swelling of lower extremities much worse.  Shortness of breath also happens.  She does have lack of energy.  She was also noted to be tachycardic.  Past Medical History:  Diagnosis Date   AKI (acute kidney injury) ([place]) 10/30/2020   Anaphylaxis due to hymenoptera venom 10/30/2014   B12 deficiency 06/23/2021   BMI 35.0-35.9,adult 02/10/2016   Cancer ([place])    Stage 4 Non-Hodgkin Lymphoma, clear for the past year   Cataract    Diffuse large B cell lymphoma ([place]) 04/09/2020   Dyslipidemia 10/19/2019   GERD (gastroesophageal reflux disease)    HFrEF (heart failure with reduced ejection fraction) ([place]) 11/25/2017   Hymenoptera allergy    Left hip pain 02/10/2016   Lymphadenopathy, inguinal 02/10/2016   Nonischemic cardiomyopathy ([place]) 11/25/2017   Overgrown toenails 09/21/2019   Palpitations 10/30/2015   Postoperative examination 02/17/2016   Swelling    Tachycardia 10/30/2015    Past Surgical History:  Procedure Laterality Date   ABDOMINAL HYSTERECTOMY     ADENOIDECTOMY     APPENDECTOMY     CHOLECYSTECTOMY     EYE SURGERY      NEPHROLITHOTOMY Left    TONSILLECTOMY      Current Medications: Current Meds  Medication Sig   acetaminophen (TYLENOL) 325 MG tablet Take 650 mg by mouth every 6 (six) hours as needed for mild pain, fever or headache.   cyclobenzaprine (FLEXERIL) 10 MG tablet Take 1 tablet (10 mg total) by mouth 3 (three) times daily as needed for muscle spasms.   EPINEPHrine 0.3 mg/0.3 mL IJ SOAJ injection Inject 0.3 mg into the muscle as needed for anaphylaxis.   furosemide (LASIX) 80 MG tablet Take 1 tablet (80 mg total) by mouth 2 (two) times daily.   losartan (COZAAR) 50 MG tablet Take 1 tablet (50 mg total) by mouth daily.   metolazone (ZAROXOLYN) 2.5 MG tablet 2 times a week   metoprolol tartrate (LOPRESSOR) 25 MG tablet Take 0.5 tablets (12.5 mg total) by mouth 2 (two) times daily.   Multiple Vitamin (MULTIVITAMIN) capsule Take 1 capsule by mouth daily. Unknown strength     Allergies:   Iodinated contrast media, Mixed vespid venom, Wasp venom, Bee venom, Furadantin [nitrofurantoin], Iodine, Iodine-131, Magnesium citrate, Penicillamine, Penicillins, Sulfa antibiotics, and Sulfasalazine   Social History   Socioeconomic History   Marital status: Widowed    Spouse name: Not on file   Number of children: Not on file   Years of education: Not on file   Highest education level: Not on file  Occupational History   Not on file  Tobacco Use   Smoking  status: Former    Types: Cigarettes    Quit date: 08/24/1993    Years since quitting: 28.7   Smokeless tobacco: Never  Substance and Sexual Activity   Alcohol use: Yes    Comment: occasional   Drug use: Never   Sexual activity: Not on file  Other Topics Concern   Not on file  Social History Narrative   Not on file   Social Determinants of Health   Financial Resource Strain: Not on file  Food Insecurity: Not on file  Transportation Needs: Not on file  Physical Activity: Not on file  Stress: Not on file  Social Connections: Not on file      Family History: The patient's family history includes Cancer in her brother; Osteoarthritis in her mother; Other in her father; Rheum arthritis in her mother. ROS:   Please see the history of present illness.    All 14 point review of systems negative except as described per history of present illness  EKGs/Labs/Other Studies Reviewed:      Recent Labs: 06/23/2021: Hemoglobin 12.7; Platelets 230 01/29/2022: ALT 2; NT-Pro BNP 47,149 02/19/2022: BUN 33; Creatinine, Ser 1.42; Potassium 5.0; Sodium 140  Recent Lipid Panel    Component Value Date/Time   CHOL 199 11/10/2020 1127   TRIG 223 (H) 11/10/2020 1127   HDL 41 11/10/2020 1127   CHOLHDL 4.9 (H) 11/10/2020 1127   LDLCALC 119 (H) 11/10/2020 1127    Physical Exam:    VS:  BP 104/72 (BP Location: Right Arm, Patient Position: Sitting)   Pulse (!) 118   Ht 5\' 2"  (1.575 m)   Wt 172 lb (78 kg)   BMI 31.46 kg/m     Wt Readings from Last 3 Encounters:  05/25/22 172 lb (78 kg)  01/29/22 169 lb 6.4 oz (76.8 kg)  10/30/21 168 lb 6.4 oz (76.4 kg)     GEN:  Well nourished, well developed in no acute distress HEENT: Normal NECK: No JVD; No carotid bruits LYMPHATICS: No lymphadenopathy CARDIAC: RRR, no murmurs, no rubs, no gallops RESPIRATORY:  Clear to auscultation without rales, wheezing or rhonchi  ABDOMEN: Soft, non-tender, non-distended MUSCULOSKELETAL:  No edema; No deformity  SKIN: Warm and dry LOWER EXTREMITIES: no swelling NEUROLOGIC:  Alert and oriented x 3 PSYCHIATRIC:  Normal affect   ASSESSMENT:    1. HFrEF (heart failure with reduced ejection fraction) ([place])   2. Nonischemic cardiomyopathy ([place])   3. AKI (acute kidney injury) ([place])   4. Diffuse large B-cell lymphoma of extranodal site excluding spleen and other solid organs ([place])   5. Dyslipidemia   6. Swelling    PLAN:    In order of problems listed above:  Congestive heart failure, clearly decompensated.  I will increase the dose of Lasix from 80 mg  twice daily to 160 in the morning and 80 in the afternoon, will check Chem-7 and proBNP today, we will see him back in my office in about 1 week. Atrial flutter it is a new discovery first time he received his diagnosis on her.  Of course she need to be anticoagulated, I will ask her to have CBC done stool for guaiac Chem-7 and then will initiate anticoagulation.  Rate seems to be a little bit elevated but I would prefer to diurese him first before adding some AV blocking agent. Chronic kidney failure.  Will recheck Chem-7 obviously to verify degree of dysfunction. Dyslipidemia is a secondary issue at this stage.  I told her if she get  worse shortness of breath gets worse or swelling gets worse she needs to go to the emergency room we also talk about diet restriction avoidance of salt which she understands very well but admits that she is noncompliant, she like to order fast food including some [place] food so I told her not to do that.   Medication Adjustments/Labs and Tests Ordered: Current medicines are reviewed at length with the patient today.  Concerns regarding medicines are outlined above.  No orders of the defined types were placed in this encounter.  Medication changes: No orders of the defined types were placed in this encounter.   Signed, Park Liter, MD, [facility] 05/25/2022 2:37 PM    [place]

## 2022-05-25 NOTE — Telephone Encounter (Signed)
Pt c/o medication issue:  1. Name of Medication:   metolazone (ZAROXOLYN) 2.5 MG tablet    2. How are you currently taking this medication (dosage and times per day)?   3. Are you having a reaction (difficulty breathing--STAT)?   4. What is your medication issue? Pt wants to know if she should still be on this medication or not. If so pt states she will need a refill. She states she thought she was only supposed to be taking this medication for a week.

## 2022-05-25 NOTE — Patient Instructions (Addendum)
Medication Instructions:   INCREASE: Lasix to 160mg  in the morning 80mg  in the afternoon   Lab Work: CBC, CMP, Stool for blood- today If you have labs (blood work) drawn today and your tests are completely normal, you will receive your results only by: [place] (if you have MyChart) OR A paper copy in the mail If you have any lab test that is abnormal or we need to change your treatment, we will call you to review the results.   Testing/Procedures: None Ordered   Follow-Up: At [facility], you and your health needs are our priority.  As part of our continuing mission to provide you with exceptional heart care, we have created designated Provider Care Teams.  These Care Teams include your primary Cardiologist (physician) and Advanced Practice Providers (APPs -  Physician Assistants and Nurse Practitioners) who all work together to provide you with the care you need, when you need it.  We recommend signing up for the patient portal called "MyChart".  Sign up information is provided on this After Visit Summary.  MyChart is used to connect with patients for Virtual Visits (Telemedicine).  Patients are able to view lab/test results, encounter notes, upcoming appointments, etc.  Non-urgent messages can be sent to your provider as well.   To learn more about what you can do with MyChart, go to [email or web address].    Your next appointment:   1 week(s)  The format for your next appointment:   In Person  Provider:   [name], MD    Other Instructions NA

## 2022-05-26 LAB — CBC
Hematocrit: 44.6 % (ref 34.0–46.6)
Hemoglobin: 14.8 g/dL (ref 11.1–15.9)
MCH: 33.6 pg — ABNORMAL HIGH (ref 26.6–33.0)
MCHC: 33.2 g/dL (ref 31.5–35.7)
MCV: 101 fL — ABNORMAL HIGH (ref 79–97)
Platelets: 263 10*3/uL (ref 150–450)
RBC: 4.41 x10E6/uL (ref 3.77–5.28)
RDW: 12.7 % (ref 11.7–15.4)
WBC: 7.2 10*3/uL (ref 3.4–10.8)

## 2022-05-26 LAB — COMPREHENSIVE METABOLIC PANEL
ALT: 8 IU/L (ref 0–32)
AST: 16 IU/L (ref 0–40)
Albumin/Globulin Ratio: 1.3 (ref 1.2–2.2)
Albumin: 3.9 g/dL (ref 3.7–4.7)
Alkaline Phosphatase: 63 IU/L (ref 44–121)
BUN/Creatinine Ratio: 22 (ref 12–28)
BUN: 49 mg/dL — ABNORMAL HIGH (ref 8–27)
Bilirubin Total: 1.3 mg/dL — ABNORMAL HIGH (ref 0.0–1.2)
CO2: 21 mmol/L (ref 20–29)
Calcium: 9.5 mg/dL (ref 8.7–10.3)
Chloride: 96 mmol/L (ref 96–106)
Creatinine, Ser: 2.19 mg/dL — ABNORMAL HIGH (ref 0.57–1.00)
Globulin, Total: 3.1 g/dL (ref 1.5–4.5)
Glucose: 84 mg/dL (ref 70–99)
Potassium: 4.2 mmol/L (ref 3.5–5.2)
Sodium: 141 mmol/L (ref 134–144)
Total Protein: 7 g/dL (ref 6.0–8.5)
eGFR: 22 mL/min/{1.73_m2} — ABNORMAL LOW (ref >59)

## 2022-05-27 ENCOUNTER — Telehealth: Payer: Self-pay

## 2022-05-27 LAB — SPECIMEN STATUS REPORT

## 2022-05-27 LAB — PRO B NATRIURETIC PEPTIDE: NT-Pro BNP: 38570 pg/mL — ABNORMAL HIGH (ref 0–738)

## 2022-05-27 NOTE — Telephone Encounter (Signed)
Spoke with pt. She stated that she was feeling ok. She had hurried to the phone and was a little short of breath. Per Dr. Wendy Poet note advised to continue same dose of Lasix as of last appt and follow up on 06-02-22. Pt agreed and will call if she has any changes or issues.

## 2022-06-01 ENCOUNTER — Telehealth: Payer: Self-pay | Admitting: Cardiology

## 2022-06-01 DIAGNOSIS — E872 Acidosis, unspecified: Secondary | ICD-10-CM | POA: Diagnosis not present

## 2022-06-01 DIAGNOSIS — Z882 Allergy status to sulfonamides status: Secondary | ICD-10-CM | POA: Diagnosis not present

## 2022-06-01 DIAGNOSIS — Z91041 Radiographic dye allergy status: Secondary | ICD-10-CM | POA: Diagnosis not present

## 2022-06-01 DIAGNOSIS — Z8261 Family history of arthritis: Secondary | ICD-10-CM | POA: Diagnosis not present

## 2022-06-01 DIAGNOSIS — I441 Atrioventricular block, second degree: Secondary | ICD-10-CM | POA: Diagnosis not present

## 2022-06-01 DIAGNOSIS — Z9221 Personal history of antineoplastic chemotherapy: Secondary | ICD-10-CM | POA: Diagnosis not present

## 2022-06-01 DIAGNOSIS — Z91148 Patient's other noncompliance with medication regimen for other reason: Secondary | ICD-10-CM | POA: Diagnosis not present

## 2022-06-01 DIAGNOSIS — Z91030 Bee allergy status: Secondary | ICD-10-CM | POA: Diagnosis not present

## 2022-06-01 DIAGNOSIS — N1832 Chronic kidney disease, stage 3b: Secondary | ICD-10-CM | POA: Diagnosis not present

## 2022-06-01 DIAGNOSIS — K449 Diaphragmatic hernia without obstruction or gangrene: Secondary | ICD-10-CM | POA: Diagnosis not present

## 2022-06-01 DIAGNOSIS — R600 Localized edema: Secondary | ICD-10-CM | POA: Diagnosis not present

## 2022-06-01 DIAGNOSIS — K219 Gastro-esophageal reflux disease without esophagitis: Secondary | ICD-10-CM | POA: Diagnosis present

## 2022-06-01 DIAGNOSIS — I428 Other cardiomyopathies: Secondary | ICD-10-CM | POA: Diagnosis not present

## 2022-06-01 DIAGNOSIS — N179 Acute kidney failure, unspecified: Secondary | ICD-10-CM | POA: Diagnosis present

## 2022-06-01 DIAGNOSIS — Z91141 Patient's other noncompliance with medication regimen due to financial hardship: Secondary | ICD-10-CM | POA: Diagnosis not present

## 2022-06-01 DIAGNOSIS — E871 Hypo-osmolality and hyponatremia: Secondary | ICD-10-CM | POA: Diagnosis not present

## 2022-06-01 DIAGNOSIS — I509 Heart failure, unspecified: Secondary | ICD-10-CM | POA: Diagnosis not present

## 2022-06-01 DIAGNOSIS — Z8711 Personal history of peptic ulcer disease: Secondary | ICD-10-CM | POA: Diagnosis not present

## 2022-06-01 DIAGNOSIS — R9431 Abnormal electrocardiogram [ECG] [EKG]: Secondary | ICD-10-CM | POA: Diagnosis not present

## 2022-06-01 DIAGNOSIS — I484 Atypical atrial flutter: Secondary | ICD-10-CM | POA: Diagnosis not present

## 2022-06-01 DIAGNOSIS — R609 Edema, unspecified: Secondary | ICD-10-CM | POA: Diagnosis not present

## 2022-06-01 DIAGNOSIS — I4891 Unspecified atrial fibrillation: Secondary | ICD-10-CM | POA: Diagnosis not present

## 2022-06-01 DIAGNOSIS — I340 Nonrheumatic mitral (valve) insufficiency: Secondary | ICD-10-CM | POA: Diagnosis not present

## 2022-06-01 DIAGNOSIS — I130 Hypertensive heart and chronic kidney disease with heart failure and stage 1 through stage 4 chronic kidney disease, or unspecified chronic kidney disease: Secondary | ICD-10-CM | POA: Diagnosis not present

## 2022-06-01 DIAGNOSIS — C8336 Diffuse large B-cell lymphoma, intrapelvic lymph nodes: Secondary | ICD-10-CM | POA: Diagnosis not present

## 2022-06-01 DIAGNOSIS — Z880 Allergy status to penicillin: Secondary | ICD-10-CM | POA: Diagnosis not present

## 2022-06-01 DIAGNOSIS — Z66 Do not resuscitate: Secondary | ICD-10-CM | POA: Diagnosis present

## 2022-06-01 DIAGNOSIS — E785 Hyperlipidemia, unspecified: Secondary | ICD-10-CM | POA: Diagnosis not present

## 2022-06-01 DIAGNOSIS — I10 Essential (primary) hypertension: Secondary | ICD-10-CM | POA: Diagnosis not present

## 2022-06-01 DIAGNOSIS — I82811 Embolism and thrombosis of superficial veins of right lower extremities: Secondary | ICD-10-CM | POA: Diagnosis not present

## 2022-06-01 DIAGNOSIS — Z87442 Personal history of urinary calculi: Secondary | ICD-10-CM | POA: Diagnosis not present

## 2022-06-01 DIAGNOSIS — D539 Nutritional anemia, unspecified: Secondary | ICD-10-CM | POA: Diagnosis not present

## 2022-06-01 DIAGNOSIS — I351 Nonrheumatic aortic (valve) insufficiency: Secondary | ICD-10-CM | POA: Diagnosis not present

## 2022-06-01 DIAGNOSIS — N1831 Chronic kidney disease, stage 3a: Secondary | ICD-10-CM | POA: Diagnosis not present

## 2022-06-01 DIAGNOSIS — N189 Chronic kidney disease, unspecified: Secondary | ICD-10-CM | POA: Diagnosis not present

## 2022-06-01 DIAGNOSIS — I5023 Acute on chronic systolic (congestive) heart failure: Secondary | ICD-10-CM | POA: Diagnosis not present

## 2022-06-01 DIAGNOSIS — Z6835 Body mass index (BMI) 35.0-35.9, adult: Secondary | ICD-10-CM | POA: Diagnosis not present

## 2022-06-01 DIAGNOSIS — Z79899 Other long term (current) drug therapy: Secondary | ICD-10-CM | POA: Diagnosis not present

## 2022-06-01 DIAGNOSIS — Z1152 Encounter for screening for COVID-19: Secondary | ICD-10-CM | POA: Diagnosis not present

## 2022-06-01 DIAGNOSIS — Z8572 Personal history of non-Hodgkin lymphomas: Secondary | ICD-10-CM | POA: Diagnosis not present

## 2022-06-01 DIAGNOSIS — Z888 Allergy status to other drugs, medicaments and biological substances status: Secondary | ICD-10-CM | POA: Diagnosis not present

## 2022-06-01 DIAGNOSIS — I4892 Unspecified atrial flutter: Secondary | ICD-10-CM | POA: Diagnosis not present

## 2022-06-01 DIAGNOSIS — Z7901 Long term (current) use of anticoagulants: Secondary | ICD-10-CM | POA: Diagnosis not present

## 2022-06-01 DIAGNOSIS — J811 Chronic pulmonary edema: Secondary | ICD-10-CM | POA: Diagnosis not present

## 2022-06-01 DIAGNOSIS — Z87891 Personal history of nicotine dependence: Secondary | ICD-10-CM | POA: Diagnosis not present

## 2022-06-01 DIAGNOSIS — R0602 Shortness of breath: Secondary | ICD-10-CM | POA: Diagnosis not present

## 2022-06-01 DIAGNOSIS — I080 Rheumatic disorders of both mitral and aortic valves: Secondary | ICD-10-CM | POA: Diagnosis not present

## 2022-06-01 NOTE — Telephone Encounter (Signed)
Call sent as urgent by call center. Pt was audibly short of breat and reported swelling up to her hips. Advised pt go to the ER. Pt agreed and verbalized understanding. Dr. Agustin Cree notified.

## 2022-06-01 NOTE — Telephone Encounter (Signed)
Pt c/o Shortness Of Breath: STAT if SOB developed within the last 24 hours or pt is noticeably SOB on the phone  1. Are you currently SOB (can you hear that pt is SOB on the phone)? Yes   2. How long have you been experiencing SOB? In the last week  3. Are you SOB when sitting or when up moving around? Both   4. Are you currently experiencing any other symptoms? Swelling in LE,

## 2022-06-02 ENCOUNTER — Inpatient Hospital Stay (HOSPITAL_COMMUNITY): Payer: Medicare Other

## 2022-06-02 ENCOUNTER — Other Ambulatory Visit (HOSPITAL_COMMUNITY): Payer: Self-pay

## 2022-06-02 ENCOUNTER — Ambulatory Visit: Payer: Medicare Other | Admitting: Cardiology

## 2022-06-02 ENCOUNTER — Encounter (HOSPITAL_COMMUNITY): Payer: Self-pay | Admitting: Cardiology

## 2022-06-02 ENCOUNTER — Other Ambulatory Visit: Payer: Self-pay

## 2022-06-02 ENCOUNTER — Encounter (HOSPITAL_COMMUNITY): Payer: Self-pay

## 2022-06-02 ENCOUNTER — Inpatient Hospital Stay (HOSPITAL_COMMUNITY)
Admission: AD | Admit: 2022-06-02 | Discharge: 2022-06-11 | DRG: 292 | Disposition: A | Discharge status: Skilled Nursing Facility | Payer: Medicare Other | Source: Other Acute Inpatient Hospital | Attending: Cardiology | Admitting: Cardiology

## 2022-06-02 DIAGNOSIS — Z91041 Radiographic dye allergy status: Secondary | ICD-10-CM | POA: Diagnosis not present

## 2022-06-02 DIAGNOSIS — R531 Weakness: Secondary | ICD-10-CM | POA: Diagnosis not present

## 2022-06-02 DIAGNOSIS — K219 Gastro-esophageal reflux disease without esophagitis: Secondary | ICD-10-CM | POA: Diagnosis present

## 2022-06-02 DIAGNOSIS — Z7409 Other reduced mobility: Secondary | ICD-10-CM | POA: Diagnosis present

## 2022-06-02 DIAGNOSIS — Z66 Do not resuscitate: Secondary | ICD-10-CM | POA: Diagnosis present

## 2022-06-02 DIAGNOSIS — I509 Heart failure, unspecified: Secondary | ICD-10-CM | POA: Diagnosis not present

## 2022-06-02 DIAGNOSIS — Z888 Allergy status to other drugs, medicaments and biological substances status: Secondary | ICD-10-CM | POA: Diagnosis not present

## 2022-06-02 DIAGNOSIS — E872 Acidosis, unspecified: Secondary | ICD-10-CM | POA: Diagnosis not present

## 2022-06-02 DIAGNOSIS — I4892 Unspecified atrial flutter: Secondary | ICD-10-CM

## 2022-06-02 DIAGNOSIS — I340 Nonrheumatic mitral (valve) insufficiency: Secondary | ICD-10-CM | POA: Diagnosis not present

## 2022-06-02 DIAGNOSIS — N179 Acute kidney failure, unspecified: Secondary | ICD-10-CM | POA: Diagnosis not present

## 2022-06-02 DIAGNOSIS — Z880 Allergy status to penicillin: Secondary | ICD-10-CM

## 2022-06-02 DIAGNOSIS — I441 Atrioventricular block, second degree: Secondary | ICD-10-CM | POA: Diagnosis not present

## 2022-06-02 DIAGNOSIS — R0602 Shortness of breath: Secondary | ICD-10-CM | POA: Diagnosis not present

## 2022-06-02 DIAGNOSIS — N189 Chronic kidney disease, unspecified: Secondary | ICD-10-CM | POA: Diagnosis not present

## 2022-06-02 DIAGNOSIS — Z79899 Other long term (current) drug therapy: Secondary | ICD-10-CM

## 2022-06-02 DIAGNOSIS — Z7401 Bed confinement status: Secondary | ICD-10-CM | POA: Diagnosis not present

## 2022-06-02 DIAGNOSIS — Z882 Allergy status to sulfonamides status: Secondary | ICD-10-CM | POA: Diagnosis not present

## 2022-06-02 DIAGNOSIS — I4891 Unspecified atrial fibrillation: Secondary | ICD-10-CM | POA: Diagnosis present

## 2022-06-02 DIAGNOSIS — M6281 Muscle weakness (generalized): Secondary | ICD-10-CM | POA: Diagnosis present

## 2022-06-02 DIAGNOSIS — I484 Atypical atrial flutter: Secondary | ICD-10-CM | POA: Diagnosis not present

## 2022-06-02 DIAGNOSIS — Z8261 Family history of arthritis: Secondary | ICD-10-CM

## 2022-06-02 DIAGNOSIS — Z8572 Personal history of non-Hodgkin lymphomas: Secondary | ICD-10-CM | POA: Diagnosis not present

## 2022-06-02 DIAGNOSIS — I351 Nonrheumatic aortic (valve) insufficiency: Secondary | ICD-10-CM | POA: Diagnosis not present

## 2022-06-02 DIAGNOSIS — Z87891 Personal history of nicotine dependence: Secondary | ICD-10-CM

## 2022-06-02 DIAGNOSIS — N1831 Chronic kidney disease, stage 3a: Secondary | ICD-10-CM | POA: Diagnosis present

## 2022-06-02 DIAGNOSIS — Z91148 Patient's other noncompliance with medication regimen for other reason: Secondary | ICD-10-CM

## 2022-06-02 DIAGNOSIS — Z7901 Long term (current) use of anticoagulants: Secondary | ICD-10-CM | POA: Diagnosis not present

## 2022-06-02 DIAGNOSIS — E039 Hypothyroidism, unspecified: Secondary | ICD-10-CM | POA: Diagnosis present

## 2022-06-02 DIAGNOSIS — E785 Hyperlipidemia, unspecified: Secondary | ICD-10-CM | POA: Diagnosis present

## 2022-06-02 DIAGNOSIS — I428 Other cardiomyopathies: Secondary | ICD-10-CM | POA: Diagnosis present

## 2022-06-02 DIAGNOSIS — I5023 Acute on chronic systolic (congestive) heart failure: Secondary | ICD-10-CM | POA: Diagnosis present

## 2022-06-02 DIAGNOSIS — E871 Hypo-osmolality and hyponatremia: Secondary | ICD-10-CM | POA: Diagnosis not present

## 2022-06-02 DIAGNOSIS — M6289 Other specified disorders of muscle: Secondary | ICD-10-CM | POA: Diagnosis present

## 2022-06-02 DIAGNOSIS — Z91030 Bee allergy status: Secondary | ICD-10-CM | POA: Diagnosis not present

## 2022-06-02 DIAGNOSIS — I130 Hypertensive heart and chronic kidney disease with heart failure and stage 1 through stage 4 chronic kidney disease, or unspecified chronic kidney disease: Secondary | ICD-10-CM | POA: Diagnosis not present

## 2022-06-02 DIAGNOSIS — Z741 Need for assistance with personal care: Secondary | ICD-10-CM | POA: Diagnosis present

## 2022-06-02 DIAGNOSIS — I483 Typical atrial flutter: Secondary | ICD-10-CM | POA: Diagnosis present

## 2022-06-02 DIAGNOSIS — I480 Paroxysmal atrial fibrillation: Secondary | ICD-10-CM | POA: Diagnosis present

## 2022-06-02 DIAGNOSIS — N183 Chronic kidney disease, stage 3 unspecified: Secondary | ICD-10-CM | POA: Diagnosis present

## 2022-06-02 DIAGNOSIS — R2681 Unsteadiness on feet: Secondary | ICD-10-CM | POA: Diagnosis present

## 2022-06-02 DIAGNOSIS — R600 Localized edema: Secondary | ICD-10-CM | POA: Diagnosis not present

## 2022-06-02 DIAGNOSIS — Z9221 Personal history of antineoplastic chemotherapy: Secondary | ICD-10-CM | POA: Diagnosis not present

## 2022-06-02 DIAGNOSIS — I080 Rheumatic disorders of both mitral and aortic valves: Secondary | ICD-10-CM | POA: Diagnosis not present

## 2022-06-02 LAB — CBC WITH DIFFERENTIAL/PLATELET
Abs Immature Granulocytes: 0.02 10*3/uL (ref 0.00–0.07)
Basophils Absolute: 0 10*3/uL (ref 0.0–0.1)
Basophils Relative: 1 %
Eosinophils Absolute: 0.1 10*3/uL (ref 0.0–0.5)
Eosinophils Relative: 1 %
HCT: 44.5 % (ref 36.0–46.0)
Hemoglobin: 14.6 g/dL (ref 12.0–15.0)
Immature Granulocytes: 0 %
Lymphocytes Relative: 14 %
Lymphs Abs: 0.7 10*3/uL (ref 0.7–4.0)
MCH: 34.9 pg — ABNORMAL HIGH (ref 26.0–34.0)
MCHC: 32.8 g/dL (ref 30.0–36.0)
MCV: 106.5 fL — ABNORMAL HIGH (ref 80.0–100.0)
Monocytes Absolute: 0.6 10*3/uL (ref 0.1–1.0)
Monocytes Relative: 12 %
Neutro Abs: 3.5 10*3/uL (ref 1.7–7.7)
Neutrophils Relative %: 72 %
Platelets: 199 10*3/uL (ref 150–400)
RBC: 4.18 MIL/uL (ref 3.87–5.11)
RDW: 14.6 % (ref 11.5–15.5)
WBC: 4.9 10*3/uL (ref 4.0–10.5)
nRBC: 0 % (ref 0.0–0.2)

## 2022-06-02 LAB — COMPREHENSIVE METABOLIC PANEL
ALT: 12 U/L (ref 0–44)
AST: 27 U/L (ref 15–41)
Albumin: 3.3 g/dL — ABNORMAL LOW (ref 3.5–5.0)
Alkaline Phosphatase: 45 U/L (ref 38–126)
Anion gap: 14 (ref 5–15)
BUN: 49 mg/dL — ABNORMAL HIGH (ref 8–23)
CO2: 25 mmol/L (ref 22–32)
Calcium: 8.9 mg/dL (ref 8.9–10.3)
Chloride: 98 mmol/L (ref 98–111)
Creatinine, Ser: 2.23 mg/dL — ABNORMAL HIGH (ref 0.44–1.00)
GFR, Estimated: 21 mL/min — ABNORMAL LOW (ref >60)
Glucose, Bld: 87 mg/dL (ref 70–99)
Potassium: 4.3 mmol/L (ref 3.5–5.1)
Sodium: 137 mmol/L (ref 135–145)
Total Bilirubin: 1.2 mg/dL (ref 0.3–1.2)
Total Protein: 6.6 g/dL (ref 6.5–8.1)

## 2022-06-02 LAB — BRAIN NATRIURETIC PEPTIDE: B Natriuretic Peptide: >4500 pg/mL — ABNORMAL HIGH (ref 0.0–100.0)

## 2022-06-02 LAB — MAGNESIUM: Magnesium: 2.1 mg/dL (ref 1.7–2.4)

## 2022-06-02 LAB — COOXEMETRY PANEL
Carboxyhemoglobin: 1.1 % (ref 0.5–1.5)
Methemoglobin: <0.7 % (ref 0.0–1.5)
O2 Saturation: 41.3 %
Total hemoglobin: 14.9 g/dL (ref 12.0–16.0)

## 2022-06-02 LAB — TSH: TSH: 13.686 u[IU]/mL — ABNORMAL HIGH (ref 0.350–4.500)

## 2022-06-02 MED ORDER — SODIUM CHLORIDE 0.9% FLUSH
3.0000 mL | INTRAVENOUS | Status: DC | PRN
  Administered 2022-06-07 – 2022-06-10 (×2): 3 mL via INTRAVENOUS

## 2022-06-02 MED ORDER — SODIUM CHLORIDE 0.9 % IV SOLN: 250.0000 mL | INTRAVENOUS | Status: DC | PRN

## 2022-06-02 MED ORDER — SODIUM CHLORIDE 0.9% FLUSH
3.0000 mL | Freq: Two times a day (BID) | INTRAVENOUS | Status: DC
  Administered 2022-06-02 – 2022-06-10 (×11): 3 mL via INTRAVENOUS

## 2022-06-02 MED ORDER — ACETAMINOPHEN 325 MG PO TABS
650.0000 mg | ORAL_TABLET | ORAL | Status: DC | PRN
  Administered 2022-06-03 – 2022-06-11 (×17): 650 mg via ORAL
  Filled 2022-06-02 (×16): qty 2

## 2022-06-02 MED ORDER — ONDANSETRON HCL 4 MG/2ML IJ SOLN: 4.0000 mg | Freq: Four times a day (QID) | INTRAMUSCULAR | Status: DC | PRN

## 2022-06-02 MED ORDER — FUROSEMIDE 10 MG/ML IJ SOLN
80.0000 mg | Freq: Two times a day (BID) | INTRAMUSCULAR | Status: DC
  Administered 2022-06-02 – 2022-06-03 (×2): 80 mg via INTRAVENOUS
  Filled 2022-06-02 (×2): qty 8

## 2022-06-02 NOTE — Progress Notes (Addendum)
Patient reports SOB at shift change. Cardiology notified. Orders received to provide earlier held lasix, and obtain EKG; see associated note for interpretation and plan.  Patient reports resolution of SOB at this time but elects to keep 2L O2 citing concerns for repeat incident. CVP initiated, COOX drawn.  On 2300 rounds, patient had been found to have removed O2. With no evidence of distress and an O2 sat maintaining in the low 90s, patient remains on room air at this time.  Patient reported two incidents of shob since previous addendum. On examination and interview patient states that this occurs while she is sleeping and shocks her awake. Patient states that she was scheduled for an outpatient sleep study in December but due to the social demands of the holiday season she was unable to keep said appointment. Description of events consistent with sleep apnea. Two liters of oxygen reapplied pending further workup.  Patient ambulated alone to [facility] with no recollection of doing so. Unknown who disabled bed alarm - bed alarm on as of last round by RN. Chest port access lost in transit. Patient now safe back in bed with bed alarm personally turned on and verified by RN. IV team notified to re-establish access. CVP and labs unobtainable until that time.  Worsening agitation with anxiety and expressed fear that patient is "going to die". No change from prior assessment. Sat with patient offering soothing presence and therapeutic reminiscence. On call physician notified; one time dose of olanzapine ordered and administered. Agitation remains with constant call light usage and attempts to ambulate unassisted.

## 2022-06-02 NOTE — Progress Notes (Addendum)
Secure chat with Dr Aundra Dubin and Darrick Grinder NP.  Pt with portacath per CXR report 2021.  At bedside, Pt states she "thinks' it is a power port but it has been intermittently maintained.  CXR ordered to confirm PAC.  If PAC functioning, will hold off on PICC line and utilize PAC per Dr. Aundra Dubin.  Dossie Arbour RN aware of plan.  Pt aware of plan and agreeable to use of PAC. Awaiting CXR at this time.

## 2022-06-02 NOTE — Progress Notes (Signed)
Notified by nursing staff that pt developed increasing SOB. Earlier Lasix held due to soft BP but BP now 99991111 systolic, has parameters to give for SBP>90. O2 sat low 90s, now 100% on 2L, RR 17. Patient assessed at bedside, no acute distress, now comfortable on 2L. Lungs with moderate air movement and coarse BS, no wheezing or rhonchi, alert and oriented. EKG shows atrial flutter 87bpm LVH with QRS widening nonspecific STT changes. D/w Dr. Harl Bowie - OK to give IV Lasix now and follow clinical response/BP, no need to start milrinone yet at this time. Have signed out to on call fellow to check in with pt in about an hour for clinical response.

## 2022-06-02 NOTE — Plan of Care (Signed)

## 2022-06-02 NOTE — H&P (Addendum)
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  EF has been reduced since 2019 -->30-35% .  In the past entresto was considered but not ordered due to cost.    Echo 2022 LVEF 30-35%, echodensity RVOT, LA dilated.   Had CMRI 2022 LVEF 20%, RV mildly reduced 39%, RVOT mass  in ECHO ?Trabeculation of RV myocardium seen under pulmonic valve.   Over the last few years she has been followed by Dr Agustin Cree for cardiology care.  She saw him on 05/25/22 and reported increased lower extremity edema, increased shortness of breath, and fatigue. Pro BNP 38570,  Lasix was increased to 160 mg in am and continued on 80 mg in the evening.  EKG showed new A flutter  RVR with plan for eventual anticoagulation after obtaining lab work.   She says she takes her medications once a week. Over the weekend she continued to feel worse. +Orthopnea. SOB with exertion.   Presented to [facility] was admitted with A/C HFrEF and A flutter RVR.VQ negative for PE. Lower extremity US negative for PE. Diuresed with 20 mg IV lasix every 8 hours. Started on eliquis. Labs from today: K 4.1, CO2 25, Creatinine 2.2 , sodium 140, Hgb 14.   Transferred to [facility] for Advance Heart Failure consultation. Complaining of ongoing shortness of breath with exertion.    Review of Systems: [y] = yes, [ ]  = no   General: Weight gain [ ] ; Weight loss [ ] ; Anorexia [ ] ; Fatigue [ ] ; Fever [ ] ; Chills [ ] ; Weakness [ ]   Cardiac: Chest pain/pressure [ ] ; Resting SOB [ ] ; Exertional SOB [ Y]; Orthopnea [ Y]; Pedal Edema [ Y]; Palpitations [ ] ; Syncope [ ] ; Presyncope [ ] ; Paroxysmal nocturnal dyspnea[ ]   Pulmonary: Cough [ ] ; Wheezing[ ] ; Hemoptysis[  ]; Sputum [ ] ; Snoring [ ]   GI: Vomiting[ ] ; Dysphagia[ ] ; Melena[ ] ; Hematochezia [ ] ; Heartburn[ ] ; Abdominal pain [ ] ; Constipation [ ] ; Diarrhea [ ] ; BRBPR [ ]   GU: Hematuria[ ] ; Dysuria [ ] ; Nocturia[ ]   Vascular: Pain in legs with walking [ ] ; Pain in feet with lying flat [ ] ; Non-healing sores [ ] ; Stroke [ ] ; TIA [ ] ; Slurred speech [ ] ;  Neuro: Headaches[ ] ; Vertigo[ ] ; Seizures[ ] ; Paresthesias[ ] ;Blurred vision [ ] ; Diplopia [ ] ; Vision changes [ ]   Ortho/Skin: Arthritis [ ] ; Joint pain [Y ]; Muscle pain [ ] ; Joint swelling [ ] ; Back Pain [ Y]; Rash [ ]   Psych: Depression[ ] ; Anxiety[ ]   Heme: Bleeding problems [ ] ; Clotting disorders [ ] ; Anemia [ ]   Endocrine: Diabetes [ ] ; Thyroid dysfunction[ ]    Home Medications Prior to Admission medications   Medication Sig Start Date End Date Taking? Authorizing Provider  acetaminophen (TYLENOL) 325 MG tablet Take 650 mg by mouth every 6 (six) hours as needed for mild pain, fever or headache.    [provider]  cyclobenzaprine (FLEXERIL) 10 MG tablet Take 1 tablet (10 mg total) by mouth 3 (three) times daily as needed for muscle spasms. 10/19/21   Dayton Scrape A, NP  EPINEPHrine 0.3 mg/0.3 mL  IJ SOAJ injection Inject 0.3 mg into the muscle as needed for anaphylaxis.    [provider]  furosemide (LASIX) 80 MG tablet Take 2 tablets in the morning and 1 tablet in the afternoon 05/25/22   Park Liter, MD  losartan (COZAAR) 50 MG tablet Take 1 tablet (50 mg total) by mouth daily. 05/13/21   Park Liter, MD  metolazone (ZAROXOLYN) 2.5 MG tablet 2 times a week 02/03/22   Park Liter, MD  metoprolol tartrate (LOPRESSOR) 25 MG tablet Take 0.5 tablets (12.5 mg total) by mouth 2 (two) times daily. 11/03/21   Park Liter, MD  Multiple Vitamin (MULTIVITAMIN) capsule Take 1 capsule by mouth daily. Unknown strength    [provider]    Past Medical History: Past Medical History:  Diagnosis  Date   AKI (acute kidney injury) ([place]) 10/30/2020   Anaphylaxis due to hymenoptera venom 10/30/2014   B12 deficiency 06/23/2021   BMI 35.0-35.9,adult 02/10/2016   Cancer ([place])    Stage 4 Non-Hodgkin Lymphoma, clear for the past year   Cataract    Diffuse large B cell lymphoma ([place]) 04/09/2020   Dyslipidemia 10/19/2019   GERD (gastroesophageal reflux disease)    HFrEF (heart failure with reduced ejection fraction) ([place]) 11/25/2017   Hymenoptera allergy    Left hip pain 02/10/2016   Lymphadenopathy, inguinal 02/10/2016   Nonischemic cardiomyopathy ([place]) 11/25/2017   Overgrown toenails 09/21/2019   Palpitations 10/30/2015   Postoperative examination 02/17/2016   Swelling    Tachycardia 10/30/2015    Past Surgical History: Past Surgical History:  Procedure Laterality Date   ABDOMINAL HYSTERECTOMY     ADENOIDECTOMY     APPENDECTOMY     CHOLECYSTECTOMY     EYE SURGERY     NEPHROLITHOTOMY Left    TONSILLECTOMY      Family History:  Family History  Problem Relation Age of Onset   Rheum arthritis Mother    Osteoarthritis Mother    Other Father        Asbestosis   Cancer Brother     Social History: Social History   Socioeconomic History   Marital status: Widowed    Spouse name: Not on file   Number of children: Not on file   Years of education: Not on file   Highest education level: Not on file  Occupational History   Not on file  Tobacco Use   Smoking status: Former    Types: Cigarettes    Quit date: 08/24/1993    Years since quitting: 28.7   Smokeless tobacco: Never  Substance and Sexual Activity   Alcohol use: Yes    Comment: occasional   Drug use: Never   Sexual activity: Not on file  Other Topics Concern   Not on file  Social History Narrative   Not on file   Social Determinants of Health   Financial Resource Strain: Not on file  Food Insecurity: Not on file  Transportation Needs: Not on file  Physical Activity: Not on file  Stress: Not on file   Social Connections: Not on file    Allergies:  Allergies  Allergen Reactions   Iodinated Contrast Media Other (See Comments)    CARDIAC ARREST  Other Reaction(s): Other (See Comments)  Reaction: Cardiovascular Arrest (ALLERGY/intolerance)   Mixed Vespid Venom Anaphylaxis   Wasp Venom Anaphylaxis   Bee Venom Other (See Comments)    Mixed vespids     Furadantin [Nitrofurantoin] Hives and Nausea Only   Iodine  Iodine-131 Other (See Comments)    Unknown   Magnesium Citrate Hives   Penicillamine Other (See Comments)    Unknown   Penicillins    Sulfa Antibiotics    Sulfasalazine Other (See Comments)    Objective:    Vital Signs:       There were no vitals filed for this visit.   Physical Exam     General:  No respiratory difficulty HEENT: Normal Neck: Supple. JVP to jaw . Carotids 2+ bilat; no bruits. No lymphadenopathy or thyromegaly appreciated. Cor: PMI nondisplaced. Irregular rate & rhythm. No rubs, gallops or murmurs. R upper chest porta cath. Lungs: Crackles in the bases on room air.  Abdomen: Soft, nontender, nondistended. No hepatosplenomegaly. No bruits or masses. Good bowel sounds. Extremities: No cyanosis, clubbing, rash, R and LLE 2+ with lower extremity hyperpigmentation.  Neuro: Alert & oriented x 3, cranial nerves grossly intact. moves all 4 extremities w/o difficulty. Affect pleasant.   Telemetry   A flutter 95 bpm   EKG  A flutter   Labs     Basic Metabolic Panel: No results for input(s): "NA", "K", "CL", "CO2", "GLUCOSE", "BUN", "CREATININE", "CALCIUM", "MG", "PHOS" in the last 168 hours.  Liver Function Tests: No results for input(s): "AST", "ALT", "ALKPHOS", "BILITOT", "PROT", "ALBUMIN" in the last 168 hours. No results for input(s): "LIPASE", "AMYLASE" in the last 168 hours. No results for input(s): "AMMONIA" in the last 168 hours.  CBC: No results for input(s): "WBC", "NEUTROABS", "HGB", "HCT", "MCV", "PLT" in the last 168  hours.  Cardiac Enzymes: No results for input(s): "CKTOTAL", "CKMB", "CKMBINDEX", "TROPONINI" in the last 168 hours.  BNP: BNP (last 3 results) No results for input(s): "BNP" in the last 8760 hours.  ProBNP (last 3 results) Recent Labs    10/30/21 1359 01/29/22 1406 05/25/22 1454  PROBNP 22,916* 47,149* 38,570*     CBG: No results for input(s): "GLUCAP" in the last 168 hours.  Coagulation Studies: No results for input(s): "LABPROT", "INR" in the last 72 hours.  Imaging: [place] EKG SITE RITE  Result Date: 06/02/2022 If Site Rite image not attached, placement could not be confirmed due to current cardiac rhythm.    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  EF has been reduced since 2019 -->30-35% .    Transferred to [facility] for heart failure management.  Assessment/Plan  1. A/C HFrEF Echo at [facility] EF severely reduced ~ 20%. EF back in 202230-35%. Suspect new onset A flutter RVR contributing. 1st noted to be in A flutter 05/25/22.  - On exam she is volume overloaded. Start IV lasix 80 mg twice a day  - Hold BB - No arb/MRA with elevated creatinine  - Place PICC. Concerned she may need milrinone.   2. A Flutter RVR - EKG today shows A flutter today with rate  - Considered amio drip but rate currently controlled.  -Continue eliquis 2.5 mg twice a day  - Check TSH  - Set up TEE DC-CV for Friday.   3. CKD Stage IIIa -3 months ago creatinine 1.4>>>2.2 today at [facility].  -Repeat BMET   4. H/O Lymphoma Completed chemotherapy 5 years ago.   5. DNR     Check CBC,BMET, BNP, TSH    Alexis Bejarano, NP 06/02/2022, 3:25 PM  Advanced Heart Failure Team Pager [phone] (M-F; 7a - 5p)  Please  contact [facility] for night-coverage after hours (4p -7a ) and weekends on [email or web address]

## 2022-06-03 ENCOUNTER — Other Ambulatory Visit (HOSPITAL_COMMUNITY): Payer: Self-pay

## 2022-06-03 ENCOUNTER — Inpatient Hospital Stay (HOSPITAL_COMMUNITY): Payer: Medicare Other

## 2022-06-03 DIAGNOSIS — I5023 Acute on chronic systolic (congestive) heart failure: Secondary | ICD-10-CM | POA: Diagnosis not present

## 2022-06-03 DIAGNOSIS — I4892 Unspecified atrial flutter: Secondary | ICD-10-CM | POA: Diagnosis not present

## 2022-06-03 LAB — COOXEMETRY PANEL
Carboxyhemoglobin: 1.7 % — ABNORMAL HIGH (ref 0.5–1.5)
Methemoglobin: <0.7 % (ref 0.0–1.5)
O2 Saturation: 56.5 %
Total hemoglobin: 14.1 g/dL (ref 12.0–16.0)

## 2022-06-03 LAB — ECHOCARDIOGRAM COMPLETE
Calc EF: 15.6 %
Est EF: <20
Height: 62 in
S' Lateral: 4.3 cm
Single Plane A2C EF: 13.9 %
Single Plane A4C EF: 12.9 %
Weight: 2768 oz

## 2022-06-03 LAB — BASIC METABOLIC PANEL
Anion gap: 9 (ref 5–15)
BUN: 47 mg/dL — ABNORMAL HIGH (ref 8–23)
CO2: 27 mmol/L (ref 22–32)
Calcium: 8.5 mg/dL — ABNORMAL LOW (ref 8.9–10.3)
Chloride: 100 mmol/L (ref 98–111)
Creatinine, Ser: 2.02 mg/dL — ABNORMAL HIGH (ref 0.44–1.00)
GFR, Estimated: 24 mL/min — ABNORMAL LOW (ref >60)
Glucose, Bld: 107 mg/dL — ABNORMAL HIGH (ref 70–99)
Potassium: 3.6 mmol/L (ref 3.5–5.1)
Sodium: 136 mmol/L (ref 135–145)

## 2022-06-03 LAB — PROTIME-INR
INR: 1.5 — ABNORMAL HIGH (ref 0.8–1.2)
Prothrombin Time: 17.5 seconds — ABNORMAL HIGH (ref 11.4–15.2)

## 2022-06-03 MED ORDER — AMIODARONE HCL IN DEXTROSE 360-4.14 MG/200ML-% IV SOLN
30.0000 mg/h | INTRAVENOUS | Status: DC
  Administered 2022-06-03 – 2022-06-09 (×11): 30 mg/h via INTRAVENOUS
  Filled 2022-06-03 (×12): qty 200

## 2022-06-03 MED ORDER — METOLAZONE 2.5 MG PO TABS
2.5000 mg | ORAL_TABLET | Freq: Once | ORAL | Status: AC
  Administered 2022-06-03: 2.5 mg via ORAL
  Filled 2022-06-03: qty 1

## 2022-06-03 MED ORDER — SODIUM CHLORIDE 0.9% FLUSH
10.0000 mL | Freq: Two times a day (BID) | INTRAVENOUS | Status: DC
  Administered 2022-06-03 – 2022-06-07 (×5): 10 mL
  Administered 2022-06-08: 20 mL
  Administered 2022-06-08: 10 mL
  Administered 2022-06-09: 20 mL
  Administered 2022-06-09 – 2022-06-11 (×4): 10 mL

## 2022-06-03 MED ORDER — SODIUM CHLORIDE 0.9 % IV SOLN: INTRAVENOUS | Status: DC

## 2022-06-03 MED ORDER — FUROSEMIDE 10 MG/ML IJ SOLN
12.0000 mg/h | INTRAVENOUS | Status: DC
  Administered 2022-06-03 – 2022-06-07 (×5): 12 mg/h via INTRAVENOUS
  Filled 2022-06-03 (×6): qty 20

## 2022-06-03 MED ORDER — OLANZAPINE 5 MG PO TBDP
5.0000 mg | ORAL_TABLET | Freq: Once | ORAL | Status: AC
  Administered 2022-06-03: 5 mg via ORAL
  Filled 2022-06-03: qty 1

## 2022-06-03 MED ORDER — AMIODARONE HCL IN DEXTROSE 360-4.14 MG/200ML-% IV SOLN
60.0000 mg/h | INTRAVENOUS | Status: AC
  Administered 2022-06-03: 60 mg/h via INTRAVENOUS
  Filled 2022-06-03: qty 200

## 2022-06-03 MED ORDER — SODIUM CHLORIDE 0.9% FLUSH: 10.0000 mL | INTRAVENOUS | Status: DC | PRN

## 2022-06-03 MED ORDER — CHLORHEXIDINE GLUCONATE CLOTH 2 % EX PADS
6.0000 | MEDICATED_PAD | Freq: Every day | CUTANEOUS | Status: DC
  Administered 2022-06-03 – 2022-06-11 (×9): 6 via TOPICAL

## 2022-06-03 MED ORDER — LEVOTHYROXINE SODIUM 25 MCG PO TABS
25.0000 ug | ORAL_TABLET | Freq: Every day | ORAL | Status: DC
  Administered 2022-06-04 – 2022-06-11 (×8): 25 ug via ORAL
  Filled 2022-06-03 (×8): qty 1

## 2022-06-03 MED ORDER — APIXABAN 2.5 MG PO TABS
2.5000 mg | ORAL_TABLET | Freq: Two times a day (BID) | ORAL | Status: DC
  Administered 2022-06-03 – 2022-06-11 (×17): 2.5 mg via ORAL
  Filled 2022-06-03 (×17): qty 1

## 2022-06-03 MED ORDER — MILRINONE LACTATE IN DEXTROSE 20-5 MG/100ML-% IV SOLN
0.2500 ug/kg/min | INTRAVENOUS | Status: DC
  Administered 2022-06-03 – 2022-06-04 (×2): 0.25 ug/kg/min via INTRAVENOUS
  Filled 2022-06-03 (×3): qty 100

## 2022-06-03 MED ORDER — POTASSIUM CHLORIDE CRYS ER 20 MEQ PO TBCR
40.0000 meq | EXTENDED_RELEASE_TABLET | Freq: Once | ORAL | Status: AC
  Administered 2022-06-03: 40 meq via ORAL
  Filled 2022-06-03: qty 2

## 2022-06-03 NOTE — Progress Notes (Addendum)
Advanced Heart Failure Rounding Note  PCP-Cardiologist: [name], MD   Subjective:    Up 2lbs, -450cc UOP documented. CVP 16/17. Co-ox 57%.   SCr improving with diuresis 2.23>2  Agitated overnight and slightly confused. Went to bathroom (without remembering she did), no UOP documented then. Lost chest port access in process. Now reestablished.   Denies CP. Intt SOB overnight, now improving. Feels like "only part of her is here and she is dead already".   Objective:   Weight Range: 78.5 kg Body mass index is 31.64 kg/m.   Vital Signs:   Temp:  [97.5 F (36.4 C)-97.9 F (36.6 C)] 97.6 F (36.4 C) (04/04 0745) Pulse Rate:  [77-94] 94 (04/04 0745) Resp:  [18-20] 18 (04/04 0745) BP: (82-99)/(53-74) 99/72 (04/04 0745) SpO2:  [93 %-100 %] 93 % (04/04 0747) Weight:  [78 kg-78.5 kg] 78.5 kg (04/04 0001) Last BM Date : 06/02/22  Weight change: Filed Weights   06/02/22 1553 06/03/22 0001  Weight: 78 kg 78.5 kg    Intake/Output:   Intake/Output Summary (Last 24 hours) at 06/03/2022 0830 Last data filed at 06/03/2022 0700 Gross per 24 hour  Intake 370 ml  Output 450 ml  Net -80 ml    Physical Exam   CVP 16/17 General:  anxious appearing. No resp difficulty HEENT: Normal Neck: Supple. JVP ~16. Carotids 2+ bilat; no bruits. No lymphadenopathy or thyromegaly appreciated. Cor: PMI nondisplaced. Regular rate & irregular rhythm. No rubs, gallops or murmurs.Marland Kitchen Port R chest Lungs: diminished bases Abdomen: Soft, nontender, nondistended. No hepatosplenomegaly. No bruits or masses. Good bowel sounds. Extremities: No cyanosis, clubbing, rash, +1 BLE edema. BLE erythema.  Neuro: Alert & orientedx3, cranial nerves grossly intact. moves all 4 extremities w/o difficulty. Affect pleasant  Telemetry   A flutter 90s (Personally reviewed)    EKG    A fib 87 bpm  Labs    CBC Recent Labs    06/02/22 1556  WBC 4.9  NEUTROABS 3.5  HGB 14.6  HCT 44.5  MCV 106.5*  PLT  [id number]   Basic Metabolic Panel Recent Labs    06/02/22 1556 06/03/22 0435  NA 137 136  K 4.3 3.6  CL 98 100  CO2 25 27  GLUCOSE 87 107*  BUN 49* 47*  CREATININE 2.23* 2.02*  CALCIUM 8.9 8.5*  MG 2.1  --    Liver Function Tests Recent Labs    06/02/22 1556  AST 27  ALT 12  ALKPHOS 45  BILITOT 1.2  PROT 6.6  ALBUMIN 3.3*   No results for input(s): "LIPASE", "AMYLASE" in the last 72 hours. Cardiac Enzymes No results for input(s): "CKTOTAL", "CKMB", "CKMBINDEX", "TROPONINI" in the last 72 hours.  BNP: BNP (last 3 results) Recent Labs    06/02/22 1602  BNP >4,500.0*   ProBNP (last 3 results) Recent Labs    10/30/21 1359 01/29/22 1406 05/25/22 1454  PROBNP 22,916* 47,149* 38,570*   D-Dimer No results for input(s): "DDIMER" in the last 72 hours. Hemoglobin A1C No results for input(s): "HGBA1C" in the last 72 hours. Fasting Lipid Panel No results for input(s): "CHOL", "HDL", "LDLCALC", "TRIG", "CHOLHDL", "LDLDIRECT" in the last 72 hours. Thyroid Function Tests Recent Labs    06/02/22 1556  TSH 13.686*   Other results:  Imaging    DG CHEST PORT 1 VIEW  Result Date: 06/02/2022 CLINICAL DATA:  Preprocedure evaluation prior to PICC line placement, history of non-Hodgkin lymphoma EXAM: PORTABLE CHEST 1 VIEW COMPARISON:  06/01/2022 FINDINGS: Single  frontal view of the chest demonstrates a right chest wall port, tip overlying the proximal SVC unchanged. Cardiac silhouette remains enlarged. Stable large hiatal hernia. Retrocardiac density likely reflects compressive atelectasis. No acute airspace disease, effusion, or pneumothorax. Density projecting over the right lung base on prior study likely reflects calcified costal cartilage seen on prior CT. No acute bony abnormality. IMPRESSION: 1. Stable enlarged cardiac silhouette. 2. Large hiatal hernia. 3. No acute airspace disease. Likely left lower lobe compressive atelectasis. Electronically Signed   By: Randa Ngo  M.D.   On: 06/02/2022 18:26   [place] EKG SITE RITE  Result Date: 06/02/2022 If Site Rite image not attached, placement could not be confirmed due to current cardiac rhythm.    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 Transferred from [place] for A/C HFrEF + A Flutter.    Assessment/Plan  1. A/C HFrEF - Echo at [place] EF severely reduced ~ 20%. EF back in 2022 30-35%. Suspect new onset A flutter RVR contributing. 1st noted to be in A flutter 05/25/22.  - On exam she is volume overloaded, BNP >4500. CVP 16/17. Continue IV lasix 80 mg twice a day, give 1 dose 2.5 metolazone  - Hold BB - No arb/MRA with elevated creatinine and soft BP - No PICC with PAC. Co-ox 57%, SCr improving with diuresis.  - Strict I&O, daily weights   2. A Flutter RVR - EKG on admission showed A flutter with rate controlled - Considered amio drip but rate currently controlled.  - Start eliquis 2.5 mg twice a day  - TSH elevated, start synthroid - TEE DC-CV set for Friday.    3. CKD Stage IIIa - 3 months ago creatinine 1.4>>>2.2 at [facility].  - SCr 2 today, improving with diuresis - avoid hypotension - Daily BMET    4. H/O Lymphoma Completed chemotherapy 5 years ago.    5. GOC - DNR  Length of Stay: [place], NP  06/03/2022, 8:30 AM  Advanced Heart Failure Team Pager [phone] (M-F; 7a - 5p)  Please contact [facility] for night-coverage after hours (5p  -7a ) and weekends on [email or web address]  Patient seen with NP, agree with the above note.   She has not had much documented UOP.  She was confused overnight, remains short of breath and orthopneic.  Seems clear currently. Co-ox 41% then 56%, CVP 16-17.  Creatinine 2.23 => 2.2.   General: NAD Neck: JVP 16 cm with HJR, no thyromegaly or thyroid nodule.  Lungs: Clear to auscultation bilaterally with normal respiratory effort. CV: Nondisplaced PMI.  Heart irregular S1/S2, no S3/S4, no murmur.  1+ edema to knees.  Abdomen: Soft, nontender, no hepatosplenomegaly, no distention.  Skin: Intact without lesions or rashes.  Neurologic: Alert and oriented x 3.  Psych: Normal affect. Extremities: No clubbing or cyanosis.  HEENT: Normal.   Acute on chronic systolic CHF, likely triggered by the onset  of atrial fibrillation/flutter at least a week ago.  She has not diuresed well so far and remains short of breath with marginal co-ox and CVP significantly elevated at 16-17.  Echo at [facility] with EF 20%, had cardiac MRI in 2022 with no LGE, suspected nonischemic cardiomyopathy. Creatinine elevated at 2 though lower than yesterday.  Baseline 1.4.  - Echo to be done today.  - I think she would benefit from inotropic support with marginal co-ox and elevated creatinine.  Will start milrinone 0.25 mcg/kg/min.  - Start Lasix gtt 12 mg/hr and will give a dose of 2.5 metolazone this afternoon.  She had 80 mg IV Lasix bolus.   She remains in atrial fibrillation today with controlled rate in 70s.  This may have triggered HF exacerbation.  - As we are starting milrinone, I am also going to start her on amiodarone gtt for rate control.  - Continue Eliquis.  - Will reassess in am.  May be able to do TEE-guided DCCV tomorrow if she diureses well and volume is significantly improved.  Would like to wean down milrinone first.  If still with significant volume overload, will delay until Monday.   Loralie Champagne 06/03/2022 11:50  AM

## 2022-06-03 NOTE — H&P (View-Only) (Signed)
  Advanced Heart Failure Rounding Note  PCP-Cardiologist: Robert Krasowski, MD   Subjective:    Up 2lbs, -450cc UOP documented. CVP 16/17. Co-ox 57%.   SCr improving with diuresis 2.23>2  Agitated overnight and slightly confused. Went to bathroom (without remembering she did), no UOP documented then. Lost chest port access in process. Now reestablished.   Denies CP. Intt SOB overnight, now improving. Feels like "only part of her is here and she is dead already".   Objective:   Weight Range: 78.5 kg Body mass index is 31.64 kg/m.   Vital Signs:   Temp:  [97.5 F (36.4 C)-97.9 F (36.6 C)] 97.6 F (36.4 C) (04/04 0745) Pulse Rate:  [77-94] 94 (04/04 0745) Resp:  [18-20] 18 (04/04 0745) BP: (82-99)/(53-74) 99/72 (04/04 0745) SpO2:  [93 %-100 %] 93 % (04/04 0747) Weight:  [78 kg-78.5 kg] 78.5 kg (04/04 0001) Last BM Date : 06/02/22  Weight change: Filed Weights   06/02/22 1553 06/03/22 0001  Weight: 78 kg 78.5 kg    Intake/Output:   Intake/Output Summary (Last 24 hours) at 06/03/2022 0830 Last data filed at 06/03/2022 0700 Gross per 24 hour  Intake 370 ml  Output 450 ml  Net -80 ml    Physical Exam   CVP 16/17 General:  anxious appearing. No resp difficulty HEENT: Normal Neck: Supple. JVP ~16. Carotids 2+ bilat; no bruits. No lymphadenopathy or thyromegaly appreciated. Cor: PMI nondisplaced. Regular rate & irregular rhythm. No rubs, gallops or murmurs.. Port R chest Lungs: diminished bases Abdomen: Soft, nontender, nondistended. No hepatosplenomegaly. No bruits or masses. Good bowel sounds. Extremities: No cyanosis, clubbing, rash, +1 BLE edema. BLE erythema.  Neuro: Alert & orientedx3, cranial nerves grossly intact. moves all 4 extremities w/o difficulty. Affect pleasant  Telemetry   A flutter 90s (Personally reviewed)    EKG    A fib 87 bpm  Labs    CBC Recent Labs    06/02/22 1556  WBC 4.9  NEUTROABS 3.5  HGB 14.6  HCT 44.5  MCV 106.5*  PLT  199   Basic Metabolic Panel Recent Labs    06/02/22 1556 06/03/22 0435  NA 137 136  K 4.3 3.6  CL 98 100  CO2 25 27  GLUCOSE 87 107*  BUN 49* 47*  CREATININE 2.23* 2.02*  CALCIUM 8.9 8.5*  MG 2.1  --    Liver Function Tests Recent Labs    06/02/22 1556  AST 27  ALT 12  ALKPHOS 45  BILITOT 1.2  PROT 6.6  ALBUMIN 3.3*   No results for input(s): "LIPASE", "AMYLASE" in the last 72 hours. Cardiac Enzymes No results for input(s): "CKTOTAL", "CKMB", "CKMBINDEX", "TROPONINI" in the last 72 hours.  BNP: BNP (last 3 results) Recent Labs    06/02/22 1602  BNP >4,500.0*   ProBNP (last 3 results) Recent Labs    10/30/21 1359 01/29/22 1406 05/25/22 1454  PROBNP 22,916* 47,149* 38,570*   D-Dimer No results for input(s): "DDIMER" in the last 72 hours. Hemoglobin A1C No results for input(s): "HGBA1C" in the last 72 hours. Fasting Lipid Panel No results for input(s): "CHOL", "HDL", "LDLCALC", "TRIG", "CHOLHDL", "LDLDIRECT" in the last 72 hours. Thyroid Function Tests Recent Labs    06/02/22 1556  TSH 13.686*   Other results:  Imaging    DG CHEST PORT 1 VIEW  Result Date: 06/02/2022 CLINICAL DATA:  Preprocedure evaluation prior to PICC line placement, history of non-Hodgkin lymphoma EXAM: PORTABLE CHEST 1 VIEW COMPARISON:  06/01/2022 FINDINGS: Single   frontal view of the chest demonstrates a right chest wall port, tip overlying the proximal SVC unchanged. Cardiac silhouette remains enlarged. Stable large hiatal hernia. Retrocardiac density likely reflects compressive atelectasis. No acute airspace disease, effusion, or pneumothorax. Density projecting over the right lung base on prior study likely reflects calcified costal cartilage seen on prior CT. No acute bony abnormality. IMPRESSION: 1. Stable enlarged cardiac silhouette. 2. Large hiatal hernia. 3. No acute airspace disease. Likely left lower lobe compressive atelectasis. Electronically Signed   By: Michael  Brown  M.D.   On: 06/02/2022 18:26   US EKG SITE RITE  Result Date: 06/02/2022 If Site Rite image not attached, placement could not be confirmed due to current cardiac rhythm.    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 Transferred from [place] for A/C HFrEF + A Flutter.    Assessment/Plan  1. A/C HFrEF - Echo at [facility] [place] EF severely reduced ~ 20%. EF back in 2022 30-35%. Suspect new onset A flutter RVR contributing. 1st noted to be in A flutter 05/25/22.  - On exam she is volume overloaded, BNP >4500. CVP 16/17. Continue IV lasix 80 mg twice a day, give 1 dose 2.5 metolazone  - Hold BB - No arb/MRA with elevated creatinine and soft BP - No PICC with PAC. Co-ox 57%, SCr improving with diuresis.  - Strict I&O, daily weights   2. A Flutter RVR - EKG on admission showed A flutter with rate controlled - Considered amio drip but rate currently controlled.  - Start eliquis 2.5 mg twice a day  - TSH elevated, start synthroid - TEE DC-CV set for Friday.    3. CKD Stage IIIa - 3 months ago creatinine 1.4>>>2.2 at [facility].  - SCr 2 today, improving with diuresis - avoid hypotension - Daily BMET    4. H/O Lymphoma Completed chemotherapy 5 years ago.    5. GOC - DNR  Length of Stay: 1  Alexis L Diaz, NP  06/03/2022, 8:30 AM  Advanced Heart Failure Team Pager [phone] (M-F; 7a - 5p)  Please contact [facility] for night-coverage after hours (5p  -7a ) and weekends on [email or web address]  Patient seen with NP, agree with the above note.   She has not had much documented UOP.  She was confused overnight, remains short of breath and orthopneic.  Seems clear currently. Co-ox 41% then 56%, CVP 16-17.  Creatinine 2.23 => 2.2.   General: NAD Neck: JVP 16 cm with HJR, no thyromegaly or thyroid nodule.  Lungs: Clear to auscultation bilaterally with normal respiratory effort. CV: Nondisplaced PMI.  Heart irregular S1/S2, no S3/S4, no murmur.  1+ edema to knees.  Abdomen: Soft, nontender, no hepatosplenomegaly, no distention.  Skin: Intact without lesions or rashes.  Neurologic: Alert and oriented x 3.  Psych: Normal affect. Extremities: No clubbing or cyanosis.  HEENT: Normal.   Acute on chronic systolic CHF, likely triggered by the onset   of atrial fibrillation/flutter at least a week ago.  She has not diuresed well so far and remains short of breath with marginal co-ox and CVP significantly elevated at 16-17.  Echo at [place] with EF 20%, had cardiac MRI in 2022 with no LGE, suspected nonischemic cardiomyopathy. Creatinine elevated at 2 though lower than yesterday.  Baseline 1.4.  - Echo to be done today.  - I think she would benefit from inotropic support with marginal co-ox and elevated creatinine.  Will start milrinone 0.25 mcg/kg/min.  - Start Lasix gtt 12 mg/hr and will give a dose of 2.5 metolazone this afternoon.  She had 80 mg IV Lasix bolus.   She remains in atrial fibrillation today with controlled rate in 70s.  This may have triggered HF exacerbation.  - As we are starting milrinone, I am also going to start her on amiodarone gtt for rate control.  - Continue Eliquis.  - Will reassess in am.  May be able to do TEE-guided DCCV tomorrow if she diureses well and volume is significantly improved.  Would like to wean down milrinone first.  If still with significant volume overload, will delay until Monday.   Tyhir Schwan 06/03/2022 11:50  AM  

## 2022-06-03 NOTE — Progress Notes (Signed)
Heart Failure Navigator Progress Note  Assessed for Heart & Vascular TOC clinic readiness.  Patient does not meet criteria due to Advanced Heart Failure Team patient of Dr. Sabharwal.   Navigator will sign off at this time.   Deontray Hunnicutt, BSN, RN Heart Failure Nurse Navigator Secure Chat Only   

## 2022-06-04 ENCOUNTER — Other Ambulatory Visit (HOSPITAL_COMMUNITY): Payer: Self-pay

## 2022-06-04 ENCOUNTER — Inpatient Hospital Stay (HOSPITAL_COMMUNITY): Payer: No Typology Code available for payment source | Admitting: Certified Registered Nurse Anesthetist

## 2022-06-04 ENCOUNTER — Inpatient Hospital Stay (HOSPITAL_COMMUNITY): Payer: Medicare Other | Admitting: Certified Registered Nurse Anesthetist

## 2022-06-04 ENCOUNTER — Inpatient Hospital Stay (HOSPITAL_COMMUNITY)
Admission: AD | Admit: 2022-06-04 | Discharge: 2022-06-04 | Disposition: A | Discharge status: Home / Self Care | Payer: No Typology Code available for payment source | Source: Other Acute Inpatient Hospital | Attending: Internal Medicine | Admitting: Cardiology

## 2022-06-04 ENCOUNTER — Encounter (HOSPITAL_COMMUNITY): Payer: Self-pay | Admitting: Cardiology

## 2022-06-04 ENCOUNTER — Encounter (HOSPITAL_COMMUNITY)
Admission: AD | Disposition: A | Discharge status: Skilled Nursing Facility | Payer: Self-pay | Source: Other Acute Inpatient Hospital | Attending: Cardiology

## 2022-06-04 DIAGNOSIS — I4891 Unspecified atrial fibrillation: Secondary | ICD-10-CM | POA: Diagnosis not present

## 2022-06-04 DIAGNOSIS — I130 Hypertensive heart and chronic kidney disease with heart failure and stage 1 through stage 4 chronic kidney disease, or unspecified chronic kidney disease: Secondary | ICD-10-CM

## 2022-06-04 DIAGNOSIS — I340 Nonrheumatic mitral (valve) insufficiency: Secondary | ICD-10-CM

## 2022-06-04 DIAGNOSIS — I5023 Acute on chronic systolic (congestive) heart failure: Secondary | ICD-10-CM | POA: Diagnosis not present

## 2022-06-04 DIAGNOSIS — I509 Heart failure, unspecified: Secondary | ICD-10-CM

## 2022-06-04 DIAGNOSIS — I080 Rheumatic disorders of both mitral and aortic valves: Secondary | ICD-10-CM | POA: Diagnosis not present

## 2022-06-04 DIAGNOSIS — I351 Nonrheumatic aortic (valve) insufficiency: Secondary | ICD-10-CM | POA: Diagnosis not present

## 2022-06-04 DIAGNOSIS — N189 Chronic kidney disease, unspecified: Secondary | ICD-10-CM

## 2022-06-04 DIAGNOSIS — Z87891 Personal history of nicotine dependence: Secondary | ICD-10-CM

## 2022-06-04 HISTORY — PX: CARDIOVERSION: SHX1299

## 2022-06-04 HISTORY — PX: TEE WITHOUT CARDIOVERSION: SHX5443

## 2022-06-04 LAB — BASIC METABOLIC PANEL
Anion gap: 18 — ABNORMAL HIGH (ref 5–15)
BUN: 44 mg/dL — ABNORMAL HIGH (ref 8–23)
CO2: 25 mmol/L (ref 22–32)
Calcium: 8.7 mg/dL — ABNORMAL LOW (ref 8.9–10.3)
Chloride: 92 mmol/L — ABNORMAL LOW (ref 98–111)
Creatinine, Ser: 1.89 mg/dL — ABNORMAL HIGH (ref 0.44–1.00)
GFR, Estimated: 26 mL/min — ABNORMAL LOW (ref >60)
Glucose, Bld: 162 mg/dL — ABNORMAL HIGH (ref 70–99)
Potassium: 4.3 mmol/L (ref 3.5–5.1)
Sodium: 135 mmol/L (ref 135–145)

## 2022-06-04 LAB — MAGNESIUM: Magnesium: 1.8 mg/dL (ref 1.7–2.4)

## 2022-06-04 LAB — COOXEMETRY PANEL
Carboxyhemoglobin: 2.2 % — ABNORMAL HIGH (ref 0.5–1.5)
Methemoglobin: <0.7 % (ref 0.0–1.5)
O2 Saturation: 74.6 %
Total hemoglobin: 13 g/dL (ref 12.0–16.0)

## 2022-06-04 SURGERY — ECHOCARDIOGRAM, TRANSESOPHAGEAL
Anesthesia: Monitor Anesthesia Care

## 2022-06-04 MED ORDER — METOLAZONE 2.5 MG PO TABS
2.5000 mg | ORAL_TABLET | Freq: Once | ORAL | Status: AC
  Administered 2022-06-04: 2.5 mg via ORAL
  Filled 2022-06-04 (×2): qty 1

## 2022-06-04 MED ORDER — PROPOFOL 10 MG/ML IV BOLUS
INTRAVENOUS | Status: DC | PRN
  Administered 2022-06-04 (×2): 10 mg via INTRAVENOUS

## 2022-06-04 MED ORDER — MILRINONE LACTATE IN DEXTROSE 20-5 MG/100ML-% IV SOLN
0.1250 ug/kg/min | INTRAVENOUS | Status: DC
  Administered 2022-06-04 – 2022-06-07 (×4): 0.125 ug/kg/min via INTRAVENOUS
  Filled 2022-06-04 (×4): qty 100

## 2022-06-04 MED ORDER — LIDOCAINE 2% (20 MG/ML) 5 ML SYRINGE
INTRAMUSCULAR | Status: DC | PRN
  Administered 2022-06-04: 80 mg via INTRAVENOUS

## 2022-06-04 MED ORDER — PROPOFOL 500 MG/50ML IV EMUL
INTRAVENOUS | Status: DC | PRN
  Administered 2022-06-04: 50 ug/kg/min via INTRAVENOUS

## 2022-06-04 MED ORDER — DEXMEDETOMIDINE HCL IN NACL 80 MCG/20ML IV SOLN
INTRAVENOUS | Status: DC | PRN
  Administered 2022-06-04: 12 ug via BUCCAL

## 2022-06-04 MED ORDER — PHENYLEPHRINE HCL-NACL 20-0.9 MG/250ML-% IV SOLN
INTRAVENOUS | Status: DC | PRN
  Administered 2022-06-04: 25 ug/min via INTRAVENOUS

## 2022-06-04 MED ORDER — MAGNESIUM SULFATE 2 GM/50ML IV SOLN
2.0000 g | Freq: Once | INTRAVENOUS | Status: AC
  Administered 2022-06-04: 2 g via INTRAVENOUS
  Filled 2022-06-04 (×2): qty 50

## 2022-06-04 MED ORDER — APIXABAN 2.5 MG PO TABS
2.5000 mg | ORAL_TABLET | Freq: Two times a day (BID) | ORAL | 3 refills | Status: DC
  Filled 2022-06-04: qty 60, 30d supply, fill #0

## 2022-06-04 MED ORDER — SPIRONOLACTONE 12.5 MG HALF TABLET: 12.5000 mg | ORAL_TABLET | Freq: Every day | ORAL | Status: DC

## 2022-06-04 MED ORDER — POTASSIUM CHLORIDE CRYS ER 20 MEQ PO TBCR
40.0000 meq | EXTENDED_RELEASE_TABLET | Freq: Once | ORAL | Status: AC
  Administered 2022-06-04: 40 meq via ORAL
  Filled 2022-06-04 (×2): qty 2

## 2022-06-04 MED ORDER — PHENYLEPHRINE 80 MCG/ML (10ML) SYRINGE FOR IV PUSH (FOR BLOOD PRESSURE SUPPORT)
PREFILLED_SYRINGE | INTRAVENOUS | Status: DC | PRN
  Administered 2022-06-04 (×2): 80 ug via INTRAVENOUS

## 2022-06-04 MED ORDER — LEVOTHYROXINE SODIUM 25 MCG PO TABS
25.0000 ug | ORAL_TABLET | Freq: Every day | ORAL | 3 refills | Status: DC
  Filled 2022-06-04: qty 30, 30d supply, fill #0

## 2022-06-04 NOTE — Transfer of Care (Signed)
Immediate Anesthesia Transfer of Care Note  Patient: Alexis Stephens  Procedure(s) Performed: TRANSESOPHAGEAL ECHOCARDIOGRAM (TEE) CARDIOVERSION  Patient Location: Endoscopy Unit  Anesthesia Type:MAC  Level of Consciousness: awake, alert , and oriented  Airway & Oxygen Therapy: Patient Spontanous Breathing and Patient connected to nasal cannula oxygen  Post-op Assessment: Report given to RN and Post -op Vital signs reviewed and stable  Post vital signs: Reviewed and stable  Last Vitals:  Vitals Value Taken Time  BP 101/56 06/04/22 1314  Temp    Pulse 76 06/04/22 1317  Resp 17 06/04/22 1317  SpO2 97 % 06/04/22 1317  Vitals shown include unvalidated device data.  Last Pain:  Vitals:   06/04/22 1146  TempSrc: Tympanic  PainSc: 0-No pain         Complications: No notable events documented.

## 2022-06-04 NOTE — CV Procedure (Signed)
Procedure: TEE  Sedation: Per anesthesiology  Indication: Atrial fibrillation  Findings: Please see echo section for full report.  Mildly dilated LV with EF 20-25%, diffuse hypokinesis.  Normal RV size with moderate systolic dysfunction.  Moderately dilated left atrium with no LA appendage thrombus.  Moderately dilated right atrium.  No PFO/ASD by color doppler . Moderate mitral regurgitation.  Trileaflet aortic valve with moderate calcification, mild aortic insufficiency and no significant stenosis.  Normal caliber thoracic aorta with grade 3 plaque.    May proceed to DCCV.   Alexis Stephens 06/04/2022 12:48 PM

## 2022-06-04 NOTE — TOC Progression Note (Signed)
Discharge medications (2) are being stored in the main pharmacy on the ground floor until patient is ready for discharge.   

## 2022-06-04 NOTE — Anesthesia Procedure Notes (Signed)
Procedure Name: MAC Date/Time: 06/04/2022 12:22 PM  Performed by: Garfield Cornea, CRNAPre-anesthesia Checklist: Patient identified, Emergency Drugs available, Suction available and Patient being monitored Patient Re-evaluated:Patient Re-evaluated prior to induction Oxygen Delivery Method: Nasal cannula Dental Injury: Teeth and Oropharynx as per pre-operative assessment

## 2022-06-04 NOTE — Progress Notes (Addendum)
Advanced Heart Failure Rounding Note  PCP-Cardiologist: Gypsy Balsam, MD   Subjective:    Lasix gtt and milrinone gtt started yesterday. Co-ox 75%. Excellent diuresis, -3.3L UOP. Weight the same. CVP improving 17>11  echo yesterday EF <20%, LV with global hypokinesis, mild LVH, RV systolic function normal, LA/RA severely dilated, trivial MR  SCr improving with diuresis 2.23>2>1.89  Scheduled for TEE/DCCV today at noon  Feels better this morning. Denies CP/SOB.   Objective:   Weight Range: 78.6 kg Body mass index is 31.68 kg/m.   Vital Signs:   Temp:  [97.2 F (36.2 C)-97.8 F (36.6 C)] 97.2 F (36.2 C) (04/05 0700) Pulse Rate:  [79-113] 106 (04/05 0700) Resp:  [18-20] 20 (04/05 0700) BP: (90-98)/(58-72) 97/63 (04/05 0700) SpO2:  [94 %-98 %] 97 % (04/05 0700) Weight:  [78.6 kg] 78.6 kg (04/05 0223) Last BM Date : 06/03/22  Weight change: Filed Weights   06/02/22 1553 06/03/22 0001 06/04/22 0223  Weight: 78 kg 78.5 kg 78.6 kg    Intake/Output:   Intake/Output Summary (Last 24 hours) at 06/04/2022 0906 Last data filed at 06/04/2022 0830 Gross per 24 hour  Intake 342.3 ml  Output 4050 ml  Net -3707.7 ml    Physical Exam   CVP 11 General:  well appearing.  No respiratory difficulty HEENT: normal Neck: supple. JVD ~10/11 cm. Carotids 2+ bilat; no bruits. No lymphadenopathy or thyromegaly appreciated. Cor: PMI nondisplaced. Irregular rate & rhythm. No rubs, gallops or murmurs. Lungs: clear Abdomen: soft, nontender, nondistended. No hepatosplenomegaly. No bruits or masses. Good bowel sounds. Extremities: no cyanosis, clubbing, rash, +2 BLE edema. Bilateral leg erythema Neuro: alert & oriented x 3, cranial nerves grossly intact. moves all 4 extremities w/o difficulty. Affect pleasant.   Telemetry   A flutter 100-110s (Personally reviewed)    EKG    No new EKG to review  Labs    CBC Recent Labs    06/02/22 1556  WBC 4.9  NEUTROABS 3.5  HGB 14.6   HCT 44.5  MCV 106.5*  PLT 199   Basic Metabolic Panel Recent Labs    53/00/51 1556 06/03/22 0435 06/04/22 0551  NA 137 136 135  K 4.3 3.6 4.3  CL 98 100 92*  CO2 25 27 25   GLUCOSE 87 107* 162*  BUN 49* 47* 44*  CREATININE 2.23* 2.02* 1.89*  CALCIUM 8.9 8.5* 8.7*  MG 2.1  --  1.8   Liver Function Tests Recent Labs    06/02/22 1556  AST 27  ALT 12  ALKPHOS 45  BILITOT 1.2  PROT 6.6  ALBUMIN 3.3*   No results for input(s): "LIPASE", "AMYLASE" in the last 72 hours. Cardiac Enzymes No results for input(s): "CKTOTAL", "CKMB", "CKMBINDEX", "TROPONINI" in the last 72 hours.  BNP: BNP (last 3 results) Recent Labs    06/02/22 1602  BNP >4,500.0*   ProBNP (last 3 results) Recent Labs    10/30/21 1359 01/29/22 1406 05/25/22 1454  PROBNP 22,916* 47,149* 38,570*   D-Dimer No results for input(s): "DDIMER" in the last 72 hours. Hemoglobin A1C No results for input(s): "HGBA1C" in the last 72 hours. Fasting Lipid Panel No results for input(s): "CHOL", "HDL", "LDLCALC", "TRIG", "CHOLHDL", "LDLDIRECT" in the last 72 hours. Thyroid Function Tests Recent Labs    06/02/22 1556  TSH 13.686*   Other results:  Imaging    ECHOCARDIOGRAM COMPLETE  Result Date: 06/03/2022    ECHOCARDIOGRAM REPORT   Patient Name:   [facility] ANN Earley Favor Date  of Exam: 06/03/2022 Medical Rec #:  [id number]                    Height:       62.0 in Accession #:    [id number]                   Weight:       173.0 lb Date of Birth:  [birth year]                     BSA:          1.797 m Patient Age:    86 years                     BP:           99/72 mmHg Patient Gender: F                            HR:           91 bpm. Exam Location:  Inpatient Procedure: 2D Echo, Cardiac Doppler and Color Doppler Indications:    A-flutter  History:        Patient has prior history of Echocardiogram examinations, most                 recent 05/28/2020. CHF; Risk Factors:Dyslipidemia.  Sonographer:    Milbert Coulter  Referring Phys: [id number] AMY D CLEGG  Sonographer Comments: Image acquisition challenging due to uncooperative patient and Patient refused rest of exam x2. IMPRESSIONS  1. Left ventricular ejection fraction, by estimation, is <20%. Left ventricular ejection fraction by 2D MOD biplane is 15.6 %. Left ventricular ejection fraction by PLAX is 33 %. The left ventricle has severely decreased function. The left ventricle demonstrates global hypokinesis. There is mild left ventricular hypertrophy. Left ventricular diastolic parameters are indeterminate.  2. Right ventricular systolic function is normal. The right ventricular size is normal.  3. Left atrial size was severely dilated.  4. Right atrial size was severely dilated.  5. The mitral valve is normal in structure. Trivial mitral valve regurgitation. No evidence of mitral stenosis.  6. The aortic valve is normal in structure. There is severe calcifcation of the aortic valve. There is severe thickening of the aortic valve. Aortic valve regurgitation is mild. No aortic stenosis is present.  7. The inferior vena cava is normal in size with greater than 50% respiratory variability, suggesting right atrial pressure of 3 mmHg. FINDINGS  Left Ventricle: Left ventricular ejection fraction, by estimation, is <20%. Left ventricular ejection fraction by PLAX is 33 %. Left ventricular ejection fraction by 2D MOD biplane is 15.6 %. The left ventricle has severely decreased function. The left ventricle demonstrates global hypokinesis. The left ventricular internal cavity size was normal in size. There is mild left ventricular hypertrophy. Left ventricular diastolic parameters are indeterminate. Right Ventricle: The right ventricular size is normal. No increase in right ventricular wall thickness. Right ventricular systolic function is normal. Left Atrium: Left atrial size was severely dilated. Right Atrium: Right atrial size was severely dilated. Pericardium: There is no evidence of  pericardial effusion. Mitral Valve: The mitral valve is normal in structure. Trivial mitral valve regurgitation. No evidence of mitral valve stenosis. Tricuspid Valve: The tricuspid valve is normal in structure. Tricuspid valve regurgitation is mild . No evidence of tricuspid stenosis. Aortic Valve: The aortic valve is normal in structure. There is  severe calcifcation of the aortic valve. There is severe thickening of the aortic valve. Aortic valve regurgitation is mild. No aortic stenosis is present. Pulmonic Valve: The pulmonic valve was normal in structure. Pulmonic valve regurgitation is trivial. No evidence of pulmonic stenosis. Aorta: The aortic root is normal in size and structure. Venous: The inferior vena cava is normal in size with greater than 50% respiratory variability, suggesting right atrial pressure of 3 mmHg. IAS/Shunts: No atrial level shunt detected by color flow Doppler.  LEFT VENTRICLE PLAX 2D                        Biplane EF (MOD) LV EF:         Left            LV Biplane EF:   Left                ventricular                      ventricular                ejection                         ejection                fraction by                      fraction by                PLAX is 33                       2D MOD                %.                               biplane is LVIDd:         5.10 cm                          15.6 %. LVIDs:         4.30 cm LV PW:         1.00 cm LV IVS:        1.10 cm LVOT diam:     1.80 cm LVOT Area:     2.54 cm  LV Volumes (MOD) LV vol d, MOD    114.0 ml A2C: LV vol d, MOD    102.0 ml A4C: LV vol s, MOD    98.1 ml A2C: LV vol s, MOD    88.8 ml A4C: LV SV MOD A2C:   15.9 ml LV SV MOD A4C:   102.0 ml LV SV MOD BP:    17.4 ml RIGHT VENTRICLE RV S prime:     6.09 cm/s TAPSE (M-mode): 1.0 cm LEFT ATRIUM             Index        RIGHT ATRIUM           Index LA diam:        3.60 cm 2.00 cm/m   RA Area:     23.70 cm LA Vol (A2C):   78.4 ml 43.62 ml/m  RA Volume:   72.60 ml  40.39 ml/m LA Vol (A4C):   70.9 ml 39.45 ml/m LA Biplane Vol: 76.3 ml 42.45 ml/m   AORTA Ao Root diam: 3.50 cm TRICUSPID VALVE TR Peak grad:   33.4 mmHg TR Vmax:        289.00 cm/s  SHUNTS Systemic Diam: 1.80 cm Chilton Si MD Electronically signed by Chilton Si MD Signature Date/Time: 06/03/2022/1:58:57 PM    Final      Medications:     Scheduled Medications:  apixaban  2.5 mg Oral BID   Chlorhexidine Gluconate Cloth  6 each Topical Daily   levothyroxine  25 mcg Oral Q0600   sodium chloride flush  10-40 mL Intracatheter Q12H   sodium chloride flush  3 mL Intravenous Q12H    Infusions:  sodium chloride     sodium chloride     amiodarone 30 mg/hr (06/04/22 0313)   furosemide (LASIX) 200 mg in dextrose 5 % 100 mL (2 mg/mL) infusion 12 mg/hr (06/04/22 0318)   milrinone 0.25 mcg/kg/min (06/04/22 0315)    PRN Medications: sodium chloride, acetaminophen, ondansetron (ZOFRAN) IV, sodium chloride flush, sodium chloride flush  Patient Profile   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 Transferred from [place] for A/C HFrEF + A Flutter.    Assessment/Plan  1. A/C HFrEF - Echo at [facility] LV EF severely reduced ~ 20%. EF back in 2022 30-35%. Suspect new onset A flutter RVR contributing. 1st noted to be in A flutter 05/25/22.  - On exam she is volume overloaded. CVP 11. Continue lasix gtt.  - echo yesterday EF <20%, LV with global hypokinesis, mild LVH, RV systolic function normal, LA/RA severely dilated, trivial MR - now on milrinone .25. Co-ox 75%. Decrease to .125 - Hold BB - No arb/MRA with elevated creatinine and soft BP - No PICC with PAC. SCr improving with diuresis.  - Strict I&O, daily weights   2. A Flutter RVR - EKG on admission showed A flutter with rate controlled - Now on amio gtt - Continue eliquis 2.5 mg twice a day  - TSH elevated,  continue synthroid - TEE DC-CV set for today at noon    3. CKD Stage IIIa - 3 months ago creatinine 1.4>>>2.2 at [facility].  - SCr 1.89 today, improving with diuresis - avoid hypotension - Daily BMET    4. H/O Lymphoma Completed chemotherapy 5 years ago.    5. GOC - DNR  Length of Stay: 2  Alen Bleacher, NP  06/04/2022, 9:06 AM  Advanced Heart Failure Team Pager [phone] (M-F; 7a - 5p)  Please contact [facility] for night-coverage after hours (5p -7a ) and weekends on [email or web address]  Patient seen with NP, agree with the above note.   Feels better today, diuresed well.  Over 3L net negative on Lasix gtt. Creatinine lower at 1.89.  Co-ox 75%.  CVP is around 12.  TEE-DCCV done today. TEE showed LV EF 20-25% with moderate RV dysfunction, moderate MR.  She was cardioverted to NSR with frequent PACs.   General: NAD Neck: JVP 14 cm, no thyromegaly or thyroid nodule.  Lungs: Clear to auscultation bilaterally with normal respiratory effort. CV: Nondisplaced PMI.  Heart irregular S1/S2, no S3/S4, no murmur.  1+ ankle edema.  Abdomen: Soft, nontender, no hepatosplenomegaly, no distention.  Skin: Intact without lesions or rashes.  Neurologic: Alert and oriented x 3.  Psych:  Normal affect. Extremities: No clubbing or cyanosis.  HEENT: Normal.   Acute on chronic systolic CHF, likely triggered by the onset of atrial fibrillation/flutter at least a week ago.  Echo at [facility] with EF 20%, had cardiac MRI in 2022 with no LGE, suspected nonischemic cardiomyopathy. TEE today with LV EF 20-25% with moderate RV dysfunction, moderate MR.  Creatinine lower 2 => 1.89.  She is now on milrinone with co-ox up to 75%.  She diuresed well yesterday but still volume overloaded on exam with CVP at least 12. BP soft and creatinine still up, will not add GDMT today.  - Decreased milrinone to 0.125 pre-DCCV.   - Continue Lasix gtt 12 mg/hr and will give a dose of 2.5 metolazone this afternoon.  Replace K  and Mg.    She was cardioverted out of AF, she is now in NSR with frequent PACs.  Worried that AF could recur.  AF may have triggered HF exacerbation.  - Continue amiodarone gtt.   - Continue Eliquis.  - Hopefully rhythm will be more stable when we stop milrinone completely, possibly tomorrow.  If AF recurs, would repeat DCCV off milrinone.   Marca Ancona 06/04/2022 1:00 PM

## 2022-06-04 NOTE — Progress Notes (Signed)
Orthopedic Tech Progress Note Patient Details:  Alexis Stephens 06-30-1935 [id number]  Ortho Devices Type of Ortho Device: Roland Rack boot Ortho Device/Splint Location: BLE Ortho Device/Splint Interventions: Ordered, Application, Adjustment   Post Interventions Patient Tolerated: Well Instructions Provided: Care of device  [place] A Gerilyn Pilgrim 06/04/2022, 4:58 PM

## 2022-06-04 NOTE — Anesthesia Postprocedure Evaluation (Signed)
Anesthesia Post Note  Patient: Alexis Stephens  Procedure(s) Performed: TRANSESOPHAGEAL ECHOCARDIOGRAM (TEE) CARDIOVERSION     Patient location during evaluation: PACU Anesthesia Type: MAC Level of consciousness: awake Pain management: pain level controlled Vital Signs Assessment: post-procedure vital signs reviewed and stable Respiratory status: spontaneous breathing, nonlabored ventilation and respiratory function stable Cardiovascular status: stable and blood pressure returned to baseline Postop Assessment: no apparent nausea or vomiting Anesthetic complications: no   No notable events documented.  Last Vitals:  Vitals:   06/04/22 1337 06/04/22 1340  BP: 96/63 95/60  Pulse: 78 79  Resp: 20 (!) 21  Temp:    SpO2: 96% 94%    Last Pain:  Vitals:   06/04/22 1340  TempSrc:   PainSc: 0-No pain                 Linton Rump

## 2022-06-04 NOTE — TOC Initial Note (Signed)
Transition of Care [facility]) - Initial/Assessment Note    Patient Details  Name: Alexis Stephens MRN: [id number] Date of Birth: March 14, 1935  Transition of Care [facility]) CM/SW Contact:    Alexis Cousin, RN Phone Number: [phone] 06/04/2022, 5:25 PM  Clinical Narrative:                  CM spoke to pt and states she was independent living at home PTA. She drives and has RW, cane and scale at home. Will continue to follow for dc needs.     Expected Discharge Plan: Home/Self Care Barriers to Discharge: Continued Medical Work up   Patient Goals and CMS Choice Patient states their goals for this hospitalization and ongoing recovery are:: wants to get better          Expected Discharge Plan and Services   Discharge Planning Services: CM Consult   Living arrangements for the past 2 months: Single Family Home                                      Prior Living Arrangements/Services Living arrangements for the past 2 months: Single Family Home Lives with:: Self Patient language and need for interpreter reviewed:: Yes Do you feel safe going back to the place where you live?: Yes      Need for Family Participation in Patient Care: No (Comment) Care giver support system in place?: Yes (comment) Current home services: DME ([employer], [facility], Scale) Criminal Activity/Legal Involvement Pertinent to Current Situation/Hospitalization: No - Comment as needed  Activities of Daily Living Home Assistive Devices/Equipment: Environmental consultant (specify type), Wheelchair, Bedside commode/3-in-1, Cane (specify quad or straight), Eyeglasses, Dentures (specify type), Hearing aid ADL Screening (condition at time of admission) Patient's cognitive ability adequate to safely complete daily activities?: Yes Is the patient deaf or have difficulty hearing?: No Does the patient have difficulty seeing, even when wearing glasses/contacts?: No Does the patient have difficulty concentrating,  remembering, or making decisions?: No Patient able to express need for assistance with ADLs?: Yes Does the patient have difficulty dressing or bathing?: No Independently performs ADLs?: Yes (appropriate for developmental age) Does the patient have difficulty walking or climbing stairs?: Yes Weakness of Legs: None Weakness of Arms/Hands: None  Permission Sought/Granted Permission sought to share information with : Case Manager, Family Supports, PCP    Share Information with NAME: Alexis Stephens     Permission granted to share info w Relationship: daughter  Permission granted to share info w Contact Information: [phone]  Emotional Assessment Appearance:: Appears stated age Attitude/Demeanor/Rapport: Engaged Affect (typically observed): Accepting Orientation: : Oriented to Self, Oriented to Place, Oriented to  Time, Oriented to Situation   Psych Involvement: No (comment)  Admission diagnosis:  Acute on chronic systolic (congestive) heart failure [I50.23] Patient Active Problem List   Diagnosis Date Noted   Acute on chronic systolic (congestive) heart failure 06/02/2022   Atrial flutter 05/25/2022   B12 deficiency 06/23/2021   AKI (acute kidney injury) 10/30/2020   Diffuse large B cell lymphoma 04/09/2020   Swelling    Hymenoptera allergy    GERD (gastroesophageal reflux disease)    Cataract    Cancer    Dyslipidemia 10/19/2019   Overgrown toenails 09/21/2019   HFrEF (heart failure with reduced ejection fraction) 11/25/2017   Nonischemic cardiomyopathy 11/25/2017   Postoperative examination 02/17/2016   BMI 35.0-35.9,adult 02/10/2016   Left hip  pain 02/10/2016   Lymphadenopathy, inguinal 02/10/2016   Palpitations 10/30/2015   Tachycardia 10/30/2015   Anaphylaxis due to hymenoptera venom 10/30/2014   PCP:  Alexis Saupe, MD Pharmacy:   [facility] [id number] - [place], [place] - [phone] AT [facility] [address] [facility] [address] [facility] & [address] [place]  [address] Phone: [phone] Fax: [phone]  [facility] [address] [place] [address] Phone: [phone] Fax: [phone]     Social Determinants of Health (SDOH) Social History: SDOH Screenings   Food Insecurity: No Food Insecurity (06/02/2022)  Housing: Low Risk  (06/02/2022)  Transportation Needs: No Transportation Needs (06/02/2022)  Utilities: Not At Risk (06/02/2022)  Tobacco Use: Medium Risk (06/04/2022)   SDOH Interventions:     Readmission Risk Interventions     No data to display

## 2022-06-04 NOTE — Procedures (Signed)
Electrical Cardioversion Procedure Note Alexis Stephens [id number] March 25, 1935  Procedure: Electrical Cardioversion Indications:  Atrial Fibrillation  Procedure Details Consent: Risks of procedure as well as the alternatives and risks of each were explained to the (patient/caregiver).  Consent for procedure obtained. Time Out: Verified patient identification, verified procedure, site/side was marked, verified correct patient position, special equipment/implants available, medications/allergies/relevent history reviewed, required imaging and test results available.  Performed  Patient placed on cardiac monitor, pulse oximetry, supplemental oxygen as necessary.  Sedation given:  Propofol per anesthesiology Pacer pads placed anterior and posterior chest.  Cardioverted 1 time(s).  Cardioverted at 200J.  Evaluation Findings: Post procedure EKG shows: NSR with frequent PACs.  Complications: None Patient did tolerate procedure well.   Marca Ancona 06/04/2022, 12:49 PM

## 2022-06-04 NOTE — Anesthesia Preprocedure Evaluation (Addendum)
Anesthesia Evaluation  Patient identified by MRN, date of birth, ID band Patient awake    Reviewed: Allergy & Precautions, NPO status , Patient's Chart, lab work & pertinent test results  History of Anesthesia Complications Negative for: history of anesthetic complications  Airway Mallampati: III  TM Distance: >3 FB Neck ROM: Full    Dental  (+) Dental Advisory Given Partial dentures:   Pulmonary shortness of breath, neg sleep apnea, neg COPD, neg recent URI, former smoker   Pulmonary exam normal breath sounds clear to auscultation       Cardiovascular hypertension (losartan, furosemide, metolazone, metoprolol), (-) angina +CHF (EF <20%)  (-) Past MI, (-) Cardiac Stents and (-) CABG + dysrhythmias Atrial Fibrillation  Rhythm:Regular Rate:Normal  HLD  TTE 06/03/2022: IMPRESSIONS     1. Left ventricular ejection fraction, by estimation, is <20%. Left  ventricular ejection fraction by 2D MOD biplane is 15.6 %. Left  ventricular ejection fraction by PLAX is 33 %. The left ventricle has  severely decreased function. The left ventricle  demonstrates global hypokinesis. There is mild left ventricular  hypertrophy. Left ventricular diastolic parameters are indeterminate.   2. Right ventricular systolic function is normal. The right ventricular  size is normal.   3. Left atrial size was severely dilated.   4. Right atrial size was severely dilated.   5. The mitral valve is normal in structure. Trivial mitral valve  regurgitation. No evidence of mitral stenosis.   6. The aortic valve is normal in structure. There is severe calcifcation  of the aortic valve. There is severe thickening of the aortic valve.  Aortic valve regurgitation is mild. No aortic stenosis is present.   7. The inferior vena cava is normal in size with greater than 50%  respiratory variability, suggesting right atrial pressure of 3 mmHg.     Neuro/Psych negative  neurological ROS     GI/Hepatic Neg liver ROS,GERD  ,,  Endo/Other  negative endocrine ROS    Renal/GU CRFRenal disease     Musculoskeletal   Abdominal   Peds  Hematology Stage IV non-Hodgkin's lymphoma s/p chemo   Anesthesia Other Findings Lasix gtt and milrinone gtt started yesterday. Also on amiodarone gtt. Co-ox 75%. Excellent diuresis, -3.3L UOP. Weight the same. CVP improving 17>11   echo yesterday EF <20%, LV with global hypokinesis, mild LVH, RV systolic function normal, LA/RA severely dilated, trivial MR   SCr improving with diuresis 2.23>2>1.89   Reproductive/Obstetrics                             Anesthesia Physical Anesthesia Plan  ASA: 4  Anesthesia Plan: MAC   Post-op Pain Management:    Induction: Intravenous  PONV Risk Score and Plan: 2 and Propofol infusion and Treatment may vary due to age or medical condition  Airway Management Planned: Natural Airway and Nasal Cannula  Additional Equipment: ClearSight  Intra-op Plan:   Post-operative Plan:   Informed Consent: I have reviewed the patients History and Physical, chart, labs and discussed the procedure including the risks, benefits and alternatives for the proposed anesthesia with the patient or authorized representative who has indicated his/her understanding and acceptance.   Patient has DNR.  Discussed DNR with patient and Suspend DNR.   Dental advisory given  Plan Discussed with: CRNA and Anesthesiologist  Anesthesia Plan Comments: (Discussed with patient risks of MAC including, but not limited to, minor pain or discomfort, hearing people in the room,  and possible need for backup general anesthesia. Risks for general anesthesia also discussed including, but not limited to, sore throat, hoarse voice, chipped/damaged teeth, injury to vocal cords, nausea and vomiting, allergic reactions, lung infection, heart attack, stroke, and death. Discussed with patient that she is  at increased risks for adverse cardiac events and even death due to her cardiac co-morbidities. Patient understands risks. All questions answered. )        Anesthesia Quick Evaluation

## 2022-06-04 NOTE — Interval H&P Note (Signed)
History and Physical Interval Note:  06/04/2022 12:18 PM  Alexis Stephens Alexis Stephens  has presented today for surgery, with the diagnosis of AFIB.  The various methods of treatment have been discussed with the patient and family. After consideration of risks, benefits and other options for treatment, the patient has consented to  Procedure(s): TRANSESOPHAGEAL ECHOCARDIOGRAM (TEE) (N/A) CARDIOVERSION (N/A) as a surgical intervention.  The patient's history has been reviewed, patient examined, no change in status, stable for surgery.  I have reviewed the patient's chart and labs.  Questions were answered to the patient's satisfaction.     [name] [employer]

## 2022-06-04 NOTE — Plan of Care (Signed)

## 2022-06-05 DIAGNOSIS — I5023 Acute on chronic systolic (congestive) heart failure: Secondary | ICD-10-CM | POA: Diagnosis not present

## 2022-06-05 LAB — COOXEMETRY PANEL
Carboxyhemoglobin: 1.9 % — ABNORMAL HIGH (ref 0.5–1.5)
Methemoglobin: 0.7 % (ref 0.0–1.5)
O2 Saturation: 68.3 %
Total hemoglobin: 14.2 g/dL (ref 12.0–16.0)

## 2022-06-05 LAB — BASIC METABOLIC PANEL
Anion gap: 16 — ABNORMAL HIGH (ref 5–15)
BUN: 44 mg/dL — ABNORMAL HIGH (ref 8–23)
CO2: 28 mmol/L (ref 22–32)
Calcium: 8.8 mg/dL — ABNORMAL LOW (ref 8.9–10.3)
Chloride: 90 mmol/L — ABNORMAL LOW (ref 98–111)
Creatinine, Ser: 2 mg/dL — ABNORMAL HIGH (ref 0.44–1.00)
GFR, Estimated: 24 mL/min — ABNORMAL LOW (ref >60)
Glucose, Bld: 103 mg/dL — ABNORMAL HIGH (ref 70–99)
Potassium: 4.3 mmol/L (ref 3.5–5.1)
Sodium: 134 mmol/L — ABNORMAL LOW (ref 135–145)

## 2022-06-05 LAB — MAGNESIUM: Magnesium: 2.5 mg/dL — ABNORMAL HIGH (ref 1.7–2.4)

## 2022-06-05 MED ORDER — METOLAZONE 2.5 MG PO TABS
2.5000 mg | ORAL_TABLET | Freq: Once | ORAL | Status: AC
  Administered 2022-06-05: 2.5 mg via ORAL
  Filled 2022-06-05: qty 1

## 2022-06-05 MED ORDER — POTASSIUM CHLORIDE CRYS ER 20 MEQ PO TBCR
40.0000 meq | EXTENDED_RELEASE_TABLET | Freq: Once | ORAL | Status: AC
  Administered 2022-06-05: 40 meq via ORAL
  Filled 2022-06-05: qty 2

## 2022-06-05 NOTE — Evaluation (Signed)
Occupational Therapy Evaluation Patient Details Name: Alexis Stephens MRN: [id number] DOB: Jun 21, 1935 Today's Date: 06/05/2022   History of Present Illness 87 yo female presents to [facility] from [place] [facility] on 4/3 for acute on chronic HF, aflutter with RVR. S/p TEE, cardiovesrsion on 4/5. PMH includes HFrEF, nonischemic cardiomypathy, CKD 3, GERD, history of lymphoma status postchemotherapy 5 years ago.   Clinical Impression   Pt reports independence at baseline with ADLs and has been using a cane x2 weeks PTA for mobility, states her daughter lives behind her. Pt set up -mod A for ADLs, min guard-min A for bed mobility,and min guard for transfer with RW. Pt very fatigued with ambulation to bathroom, 2/4 DOE and SpO2 90-91%, pt stating she feels like she "may pass out" due to weakness. HR ~110-120bpm with mobility. Pt presenting with impairments listed below, will follow acutely. Recommend HHOT at d/c.     Recommendations for follow up therapy are one component of a multi-disciplinary discharge planning process, led by the attending physician.  Recommendations may be updated based on patient status, additional functional criteria and insurance authorization.   Assistance Recommended at Discharge Intermittent Supervision/Assistance  Patient can return home with the following A little help with walking and/or transfers;Assistance with cooking/housework;A lot of help with bathing/dressing/bathroom;Assist for transportation;Help with stairs or ramp for entrance    Functional Status Assessment  Patient has had a recent decline in their functional status and demonstrates the ability to make significant improvements in function in a reasonable and predictable amount of time.  Equipment Recommendations  None recommended by OT (pt has all needed DME)    Recommendations for Other Services PT consult     Precautions / Restrictions Precautions Precautions: Fall Precaution Comments: watch  sats, HR Restrictions Weight Bearing Restrictions: No      Mobility Bed Mobility Overal bed mobility: Needs Assistance Bed Mobility: Supine to Sit, Sit to Supine     Supine to sit: Min guard Sit to supine: Min assist   General bed mobility comments: min A for trunk elevation    Transfers Overall transfer level: Needs assistance Equipment used: Rolling walker (2 wheels) Transfers: Sit to/from Stand Sit to Stand: Min guard, From elevated surface                  Balance Overall balance assessment: Needs assistance Sitting-balance support: No upper extremity supported, Feet supported Sitting balance-Leahy Scale: Fair     Standing balance support: Bilateral upper extremity supported, During functional activity, Reliant on assistive device for balance Standing balance-Leahy Scale: Poor                             ADL either performed or assessed with clinical judgement   ADL Overall ADL's : Needs assistance/impaired Eating/Feeding: Set up;Sitting   Grooming: Sitting;Standing;Min guard   Upper Body Bathing: Minimal assistance;Sitting   Lower Body Bathing: Moderate assistance   Upper Body Dressing : Minimal assistance   Lower Body Dressing: Moderate assistance   Toilet Transfer: Min guard;Ambulation;Regular Toilet;Rolling walker (2 wheels)   Toileting- Clothing Manipulation and Hygiene: Supervision/safety       Functional mobility during ADLs: Min guard;Rolling walker (2 wheels)       Vision   Vision Assessment?: No apparent visual deficits     Perception Perception Perception Tested?: No   Praxis Praxis Praxis tested?: Not tested    Pertinent Vitals/Pain Pain Assessment Pain Assessment: No/denies pain  Hand Dominance Right   Extremity/Trunk Assessment Upper Extremity Assessment Upper Extremity Assessment: Generalized weakness   Lower Extremity Assessment Lower Extremity Assessment: Defer to PT evaluation   Cervical /  Trunk Assessment Cervical / Trunk Assessment: Normal   Communication Communication Communication: HOH   Cognition Arousal/Alertness: Awake/alert Behavior During Therapy: WFL for tasks assessed/performed Overall Cognitive Status: Within Functional Limits for tasks assessed                                       General Comments  SpO2 down to 90% on RA, pt with 2/4 DOE with ambulation to bathroom    Exercises     Shoulder Instructions      Home Living Family/patient expects to be discharged to:: Private residence Living Arrangements: Alone Available Help at Discharge: Family (daughter lives behind her) Type of Home: House Home Access: Stairs to enter Secretary/administrator of Steps: 4   Home Layout: One level     Bathroom Shower/Tub: Producer, television/film/video: Standard     Home Equipment: Agricultural consultant (2 wheels);BSC/3in1;Cane - single point;Adaptive equipment Adaptive Equipment: Reacher;Sock aid;Feeding equipment        Prior Functioning/Environment Prior Level of Function : Independent/Modified Independent             Mobility Comments: pt reports using cane as needed, states she was using cane 2 weeks leading up to admission ADLs Comments: pt reports independence        OT Problem List: Decreased strength;Decreased range of motion;Decreased activity tolerance;Impaired balance (sitting and/or standing);Cardiopulmonary status limiting activity      OT Treatment/Interventions: Self-care/ADL training;Therapeutic exercise;Energy conservation;DME and/or AE instruction;Therapeutic activities;Balance training;Patient/family education    OT Goals(Current goals can be found in the care plan section) Acute Rehab OT Goals Patient Stated Goal: none stated OT Goal Formulation: With patient Time For Goal Achievement: 06/19/22 Potential to Achieve Goals: Good ADL Goals Pt Will Perform Lower Body Dressing: with modified  independence;sitting/lateral leans;sit to/from stand;with adaptive equipment Pt Will Perform Tub/Shower Transfer: Shower transfer;with modified independence;ambulating;shower seat Additional ADL Goal #1: pt will verbalize 3 energy conservation strategies in prep for ADLs  OT Frequency: Min 2X/week    Co-evaluation              AM-PAC OT "6 Clicks" Daily Activity     Outcome Measure Help from another person eating meals?: None Help from another person taking care of personal grooming?: A Little Help from another person toileting, which includes using toliet, bedpan, or urinal?: A Little Help from another person bathing (including washing, rinsing, drying)?: A Lot Help from another person to put on and taking off regular upper body clothing?: A Little Help from another person to put on and taking off regular lower body clothing?: A Lot 6 Click Score: 17   End of Session Equipment Utilized During Treatment: Gait belt;Rolling walker (2 wheels) Nurse Communication: Mobility status  Activity Tolerance: Patient tolerated treatment well Patient left: in bed;with call bell/phone within reach;with bed alarm set  OT Visit Diagnosis: Unsteadiness on feet (R26.81);Other abnormalities of gait and mobility (R26.89);Muscle weakness (generalized) (M62.81)                Time: 3094-0768 OT Time Calculation (min): 23 min Charges:  OT General Charges $OT Visit: 1 Visit OT Evaluation $OT Eval Moderate Complexity: 1 Mod OT Treatments $Self Care/Home Management : 8-22 mins  Carver Fila,  OTD, OTR/L SecureChat Preferred Acute Rehab [phone]   Dalphine Handingatalie K Koonce 06/05/2022, 11:38 AM

## 2022-06-05 NOTE — Evaluation (Signed)
Physical Therapy Evaluation Patient Details Name: Alexis Stephens MRN: [id number] DOB: [birth year] Today's Date: 06/05/2022  History of Present Illness  87 yo female presents to [facility] from [place] [facility] on 4/3 for acute on chronic HF, aflutter with RVR. S/p TEE, cardiovesrsion on 4/5. PMH includes HFrEF, nonischemic cardiomypathy, CKD 3, GERD, history of lymphoma status postchemotherapy 5 years ago.  Clinical Impression   Pt presents with generalized weakness, impaired balance, dyspnea on exertion with SpO2 drop to 88% on RA during gait, and decreased activity tolerance. Pt to benefit from acute PT to address deficits. Pt ambulated short room distance with use of UE support, at baseline pt uses cane intermittently and is independent with ADLs. Pt requiring close guard for safety for mobility at this time, states her daughter who lives closeby can assist at home and requests no PT follow up.  PT to progress mobility as tolerated, and will continue to follow acutely.         Recommendations for follow up therapy are one component of a multi-disciplinary discharge planning process, led by the attending physician.  Recommendations may be updated based on patient status, additional functional criteria and insurance authorization.  Follow Up Recommendations       Assistance Recommended at Discharge Intermittent Supervision/Assistance  Patient can return home with the following  A little help with walking and/or transfers;A little help with bathing/dressing/bathroom    Equipment Recommendations None recommended by PT  Recommendations for Other Services       Functional Status Assessment Patient has had a recent decline in their functional status and demonstrates the ability to make significant improvements in function in a reasonable and predictable amount of time.     Precautions / Restrictions Precautions Precautions: Fall Precaution Comments: watch sats,  HR Restrictions Weight Bearing Restrictions: No      Mobility  Bed Mobility Overal bed mobility: Needs Assistance Bed Mobility: Supine to Sit, Sit to Supine     Supine to sit: Min assist, HOB elevated Sit to supine: Min assist, HOB elevated   General bed mobility comments: light trunk rise and lower assist, postiioning upon return to supine.    Transfers Overall transfer level: Needs assistance Equipment used: 1 person hand held assist Transfers: Sit to/from Stand Sit to Stand: Min guard, From elevated surface           General transfer comment: cues for hand placement, slowed rise and steady. x2, from EOB and toilet.    Ambulation/Gait Ambulation/Gait assistance: Min guard Gait Distance (Feet): 15 Feet (x2 - to and from bathroom) Assistive device: Rolling walker (2 wheels), IV Pole Gait Pattern/deviations: Step-through pattern, Decreased stride length, Trunk flexed Gait velocity: decr     General Gait Details: initially use of IV pole, transitioned to RW for increased support and for balance. Cues for proximity of RW. HR to 120s, DOE 2/4 with SpO2 88% post-gait  Stairs            Wheelchair Mobility    Modified Rankin (Stroke Patients Only)       Balance Overall balance assessment: Needs assistance Sitting-balance support: No upper extremity supported, Feet supported Sitting balance-Leahy Scale: Fair     Standing balance support: Bilateral upper extremity supported, During functional activity, Reliant on assistive device for balance Standing balance-Leahy Scale: Poor                               Pertinent Vitals/Pain Pain  Assessment Pain Assessment: No/denies pain    Home Living Family/patient expects to be discharged to:: Private residence Living Arrangements: Alone Available Help at Discharge: Family (daughter lives behind her) Type of Home: House Home Access: Stairs to enter   Secretary/administrator of Steps: 4   Home  Layout: One level Home Equipment: Agricultural consultant (2 wheels);BSC/3in1;Cane - single point      Prior Function Prior Level of Function : Independent/Modified Independent             Mobility Comments: pt reports using cane as needed, states she was using cane 2 weeks leading up to admission ADLs Comments: pt reports independence     Hand Dominance   Dominant Hand: Right    Extremity/Trunk Assessment   Upper Extremity Assessment Upper Extremity Assessment: Defer to OT evaluation    Lower Extremity Assessment Lower Extremity Assessment: Generalized weakness    Cervical / Trunk Assessment Cervical / Trunk Assessment: Normal  Communication   Communication: HOH  Cognition Arousal/Alertness: Awake/alert Behavior During Therapy: WFL for tasks assessed/performed Overall Cognitive Status: Within Functional Limits for tasks assessed                                          General Comments      Exercises     Assessment/Plan    PT Assessment Patient needs continued PT services  PT Problem List Decreased strength;Decreased mobility;Decreased activity tolerance;Decreased balance;Decreased knowledge of use of DME;Cardiopulmonary status limiting activity       PT Treatment Interventions DME instruction;Therapeutic activities;Gait training;Therapeutic exercise;Patient/family education;Balance training;Stair training;Functional mobility training;Neuromuscular re-education    PT Goals (Current goals can be found in the Care Plan section)  Acute Rehab PT Goals Patient Stated Goal: home PT Goal Formulation: With patient Time For Goal Achievement: 06/19/22 Potential to Achieve Goals: Good    Frequency Min 3X/week     Co-evaluation               AM-PAC PT "6 Clicks" Mobility  Outcome Measure Help needed turning from your back to your side while in a flat bed without using bedrails?: A Little Help needed moving from lying on your back to sitting on  the side of a flat bed without using bedrails?: A Little Help needed moving to and from a bed to a chair (including a wheelchair)?: A Little Help needed standing up from a chair using your arms (e.g., wheelchair or bedside chair)?: A Little Help needed to walk in hospital room?: A Little Help needed climbing 3-5 steps with a railing? : A Lot 6 Click Score: 17    End of Session   Activity Tolerance: Patient tolerated treatment well;Patient limited by fatigue Patient left: in bed;with call bell/phone within reach;with bed alarm set;with nursing/sitter in room Nurse Communication: Mobility status PT Visit Diagnosis: Other abnormalities of gait and mobility (R26.89);Muscle weakness (generalized) (M62.81)    Time: 0938-1829 PT Time Calculation (min) (ACUTE ONLY): 25 min   Charges:   PT Evaluation $PT Eval Low Complexity: 1 Low PT Treatments $Therapeutic Activity: 8-22 mins        Alexis Stephens, PT DPT Acute Rehabilitation Services Pager [phone]  Office [phone]   Alexis Stephens 06/05/2022, 11:25 AM

## 2022-06-05 NOTE — Progress Notes (Signed)
Orthopedic Tech Progress Note Patient Details:  Alexis Stephens 01/17/1936 [id number]  Ortho Devices Type of Ortho Device: Roland Rack boot Ortho Device/Splint Location: Bi LE Ortho Device/Splint Interventions: Application   Post Interventions Patient Tolerated: Well Instructions Provided: Care of device  Haillie Radu E Mae Cianci 06/05/2022, 2:00 PM

## 2022-06-05 NOTE — Progress Notes (Signed)
Advanced Heart Failure Rounding Note  PCP-Cardiologist: Gypsy Balsam, MD   Subjective:    Patient continues on Lasix gtt 12 mg/hr + milrinone 0.125.  Co-ox 68%.  CVP 13 this morning.  Creatinine 1.89 => 2.   She remains in NSR after DCCV, on amiodarone gtt 30 mg/hr.   Objective:   Weight Range: 77 kg Body mass index is 31.04 kg/m.   Vital Signs:   Temp:  [97.2 F (36.2 C)-97.9 F (36.6 C)] 97.5 F (36.4 C) (04/06 0842) Pulse Rate:  [75-118] 95 (04/06 0842) Resp:  [17-23] 18 (04/06 0842) BP: (90-140)/(56-73) 95/62 (04/06 0842) SpO2:  [94 %-99 %] 94 % (04/06 0842) Weight:  [77 kg] 77 kg (04/06 0416) Last BM Date : 06/04/22  Weight change: Filed Weights   06/04/22 0223 06/05/22 0055 06/05/22 0416  Weight: 78.6 kg 77 kg 77 kg    Intake/Output:   Intake/Output Summary (Last 24 hours) at 06/05/2022 0937 Last data filed at 06/05/2022 0843 Gross per 24 hour  Intake 1510.02 ml  Output 3300 ml  Net -1789.98 ml    Physical Exam   CVP 13 General: NAD Neck: JVP 12-14 cm, no thyromegaly or thyroid nodule.  Lungs: Clear to auscultation bilaterally with normal respiratory effort. CV: Nondisplaced PMI.  Heart regular S1/S2, no S3/S4, no murmur.  1+ ankle edema.  Abdomen: Soft, nontender, no hepatosplenomegaly, no distention.  Skin: Intact without lesions or rashes.  Neurologic: Alert and oriented x 3.  Psych: Normal affect. Extremities: No clubbing or cyanosis.  HEENT: Normal.    Telemetry   NSR, ?Wenckebach periodicity at times (personally reviewed)   EKG    No new EKG to review  Labs    CBC Recent Labs    06/02/22 1556  WBC 4.9  NEUTROABS 3.5  HGB 14.6  HCT 44.5  MCV 106.5*  PLT 199   Basic Metabolic Panel Recent Labs    09/30/21 0551 06/05/22 0446  NA 135 134*  K 4.3 4.3  CL 92* 90*  CO2 25 28  GLUCOSE 162* 103*  BUN 44* 44*  CREATININE 1.89* 2.00*  CALCIUM 8.7* 8.8*  MG 1.8 2.5*   Liver Function Tests Recent Labs     06/02/22 1556  AST 27  ALT 12  ALKPHOS 45  BILITOT 1.2  PROT 6.6  ALBUMIN 3.3*   No results for input(s): "LIPASE", "AMYLASE" in the last 72 hours. Cardiac Enzymes No results for input(s): "CKTOTAL", "CKMB", "CKMBINDEX", "TROPONINI" in the last 72 hours.  BNP: BNP (last 3 results) Recent Labs    06/02/22 1602  BNP >4,500.0*   ProBNP (last 3 results) Recent Labs    10/30/21 1359 01/29/22 1406 05/25/22 1454  PROBNP 22,916* 47,149* 38,570*   D-Dimer No results for input(s): "DDIMER" in the last 72 hours. Hemoglobin A1C No results for input(s): "HGBA1C" in the last 72 hours. Fasting Lipid Panel No results for input(s): "CHOL", "HDL", "LDLCALC", "TRIG", "CHOLHDL", "LDLDIRECT" in the last 72 hours. Thyroid Function Tests Recent Labs    06/02/22 1556  TSH 13.686*   Other results:  Imaging    No results found.   Medications:     Scheduled Medications:  apixaban  2.5 mg Oral BID   Chlorhexidine Gluconate Cloth  6 each Topical Daily   levothyroxine  25 mcg Oral Q0600   metolazone  2.5 mg Oral Once   potassium chloride  40 mEq Oral Once   sodium chloride flush  10-40 mL Intracatheter Q12H   sodium  chloride flush  3 mL Intravenous Q12H    Infusions:  sodium chloride     amiodarone 30 mg/hr (06/04/22 1512)   furosemide (LASIX) 200 mg in dextrose 5 % 100 mL (2 mg/mL) infusion 12 mg/hr (06/04/22 2123)   milrinone 0.125 mcg/kg/min (06/05/22 0704)    PRN Medications: sodium chloride, acetaminophen, ondansetron (ZOFRAN) IV, sodium chloride flush, sodium chloride flush  Patient Profile   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 Transferred from [place] for A/C HFrEF + A Flutter.    Assessment/Plan  1. Acute on chronic systolic CHF: NICM, exacerbation likely triggered by the onset of atrial fibrillation/flutter at least a week ago.  Echo at  [facility] with EF 20%, had cardiac MRI in 2022 with no LGE, suspected nonischemic cardiomyopathy. TEE 4/5 with LV EF 20-25% with moderate RV dysfunction, moderate MR.  Milrinone started with low co-ox, decreased yesterday to 0.125.  Co-ox 68% today.  Creatinine 2 => 1.89 => 2. She diuresed well yesterday with weight down 4 lbs but still volume overloaded on exam with CVP 13.  - Continue milrinone 0.125 today.  - Continue Lasix gtt 12 mg/hr and will give a dose of 2.5 metolazone again.  Watch creatinine closely.  - With elevated creatinine and soft BP, no room to add GDMT currently.  2. Atypical atrial flutter/fibrillation: Now back in NSR s/p DCCV on 4/5.  Frequent PACs and episodes of Wenckebach periodicity.  - Continue amiodarone gtt 30 mg/hr whjle on milrinone.  - Continue eliquis 2.5 mg twice a day  - TSH elevated, continue synthroid - Hopefully rhythm will be more stable when we stop milrinone completely, possibly tomorrow.  If AF recurs, would repeat DCCV off milrinone.  3. AKI on CKD Stage 3:  3 months ago creatinine 1.4>>>2.2 at [facility]. Creatinine 2 today.   - avoid hypotension - Daily BMET  - Limits GDMT.  4. H/O Lymphoma: Completed chemotherapy 5 years ago.  5. GOC: DNR  Length of Stay: 3  Marca Ancona, MD  06/05/2022, 9:37 AM  Advanced Heart Failure Team Pager [phone] (M-F; 7a - 5p)  Please contact [facility] for night-coverage after hours (5p -7a ) and weekends on [email or web address]

## 2022-06-06 DIAGNOSIS — I5023 Acute on chronic systolic (congestive) heart failure: Secondary | ICD-10-CM | POA: Diagnosis not present

## 2022-06-06 LAB — COOXEMETRY PANEL
Carboxyhemoglobin: 1.4 % (ref 0.5–1.5)
Methemoglobin: <0.7 % (ref 0.0–1.5)
O2 Saturation: 63.4 %
Total hemoglobin: 14.7 g/dL (ref 12.0–16.0)

## 2022-06-06 LAB — BASIC METABOLIC PANEL
Anion gap: 15 (ref 5–15)
BUN: 42 mg/dL — ABNORMAL HIGH (ref 8–23)
CO2: 32 mmol/L (ref 22–32)
Calcium: 8.9 mg/dL (ref 8.9–10.3)
Chloride: 85 mmol/L — ABNORMAL LOW (ref 98–111)
Creatinine, Ser: 2.02 mg/dL — ABNORMAL HIGH (ref 0.44–1.00)
GFR, Estimated: 24 mL/min — ABNORMAL LOW (ref >60)
Glucose, Bld: 99 mg/dL (ref 70–99)
Potassium: 3.7 mmol/L (ref 3.5–5.1)
Sodium: 132 mmol/L — ABNORMAL LOW (ref 135–145)

## 2022-06-06 LAB — MAGNESIUM: Magnesium: 2.3 mg/dL (ref 1.7–2.4)

## 2022-06-06 MED ORDER — CYCLOBENZAPRINE HCL 5 MG PO TABS
5.0000 mg | ORAL_TABLET | Freq: Once | ORAL | Status: AC
  Administered 2022-06-07: 5 mg via ORAL
  Filled 2022-06-06: qty 1

## 2022-06-06 MED ORDER — MELATONIN 3 MG PO TABS
3.0000 mg | ORAL_TABLET | Freq: Every day | ORAL | Status: DC
  Administered 2022-06-07 – 2022-06-10 (×5): 3 mg via ORAL
  Filled 2022-06-06 (×6): qty 1

## 2022-06-06 MED ORDER — POTASSIUM CHLORIDE CRYS ER 20 MEQ PO TBCR
40.0000 meq | EXTENDED_RELEASE_TABLET | Freq: Once | ORAL | Status: AC
  Administered 2022-06-06: 40 meq via ORAL
  Filled 2022-06-06: qty 2

## 2022-06-06 NOTE — Progress Notes (Signed)
Patient ID: Alexis Stephens, female   DOB: 25-Dec-1935, 87 y.o.   MRN: [id number]     Advanced Heart Failure Rounding Note  PCP-Cardiologist: Gypsy Balsam, MD   Subjective:    Patient continues on Lasix gtt 12 mg/hr + milrinone 0.125, got dose of metolazone 2.5 yesterday.  Good diuresis, weight down 5 lbs.  Co-ox 63%.  CVP 9-10 this morning.  Creatinine 1.89 => 2 => 2.   She remains in NSR with PACs and episodes of Wenckebach periodicity after DCCV, on amiodarone gtt 30 mg/hr.   Still has some dyspnea and orthopnea.   Objective:   Weight Range: 74.4 kg Body mass index is 30 kg/m.   Vital Signs:   Temp:  [97.2 F (36.2 C)-98.4 F (36.9 C)] 97.6 F (36.4 C) (04/07 0500) Pulse Rate:  [69-99] 94 (04/07 0500) Resp:  [17-20] 20 (04/07 0500) BP: (91-102)/(56-65) 92/65 (04/07 0500) SpO2:  [94 %-99 %] 95 % (04/07 0500) Weight:  [74.4 kg] 74.4 kg (04/07 0500) Last BM Date : 06/04/22  Weight change: Filed Weights   06/05/22 0055 06/05/22 0416 06/06/22 0500  Weight: 77 kg 77 kg 74.4 kg    Intake/Output:   Intake/Output Summary (Last 24 hours) at 06/06/2022 0930 Last data filed at 06/06/2022 0656 Gross per 24 hour  Intake 1211.02 ml  Output 3450 ml  Net -2238.98 ml    Physical Exam   CVP 9-10 General: NAD Neck: JVP 10 cm, no thyromegaly or thyroid nodule.  Lungs: Clear to auscultation bilaterally with normal respiratory effort. CV: Nondisplaced PMI.  Heart regular S1/S2, no S3/S4, no murmur.  Trace ankle edema.  Abdomen: Soft, nontender, no hepatosplenomegaly, no distention.  Skin: Intact without lesions or rashes.  Neurologic: Alert and oriented x 3.  Psych: Normal affect. Extremities: No clubbing or cyanosis.  HEENT: Normal.   Telemetry   NSR, ?Wenckebach periodicity at times (personally reviewed)   EKG    No new EKG to review  Labs    CBC No results for input(s): "WBC", "NEUTROABS", "HGB", "HCT", "MCV", "PLT" in the last 72 hours.  Basic  Metabolic Panel Recent Labs    34/19/37 0446 06/06/22 0650  NA 134* 132*  K 4.3 3.7  CL 90* 85*  CO2 28 32  GLUCOSE 103* 99  BUN 44* 42*  CREATININE 2.00* 2.02*  CALCIUM 8.8* 8.9  MG 2.5* 2.3   Liver Function Tests No results for input(s): "AST", "ALT", "ALKPHOS", "BILITOT", "PROT", "ALBUMIN" in the last 72 hours.  No results for input(s): "LIPASE", "AMYLASE" in the last 72 hours. Cardiac Enzymes No results for input(s): "CKTOTAL", "CKMB", "CKMBINDEX", "TROPONINI" in the last 72 hours.  BNP: BNP (last 3 results) Recent Labs    06/02/22 1602  BNP >4,500.0*   ProBNP (last 3 results) Recent Labs    10/30/21 1359 01/29/22 1406 05/25/22 1454  PROBNP 22,916* 47,149* 38,570*   D-Dimer No results for input(s): "DDIMER" in the last 72 hours. Hemoglobin A1C No results for input(s): "HGBA1C" in the last 72 hours. Fasting Lipid Panel No results for input(s): "CHOL", "HDL", "LDLCALC", "TRIG", "CHOLHDL", "LDLDIRECT" in the last 72 hours. Thyroid Function Tests No results for input(s): "TSH", "T4TOTAL", "T3FREE", "THYROIDAB" in the last 72 hours.  Invalid input(s): "FREET3"  Other results:  Imaging    No results found.   Medications:     Scheduled Medications:  apixaban  2.5 mg Oral BID   Chlorhexidine Gluconate Cloth  6 each Topical Daily   levothyroxine  25 mcg  Oral Q0600   potassium chloride  40 mEq Oral Once   sodium chloride flush  10-40 mL Intracatheter Q12H   sodium chloride flush  3 mL Intravenous Q12H    Infusions:  sodium chloride     amiodarone 30 mg/hr (06/06/22 0453)   furosemide (LASIX) 200 mg in dextrose 5 % 100 mL (2 mg/mL) infusion 12 mg/hr (06/06/22 0656)   milrinone 0.125 mcg/kg/min (06/05/22 0704)    PRN Medications: sodium chloride, acetaminophen, ondansetron (ZOFRAN) IV, sodium chloride flush, sodium chloride flush  Patient Profile   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 Transferred from [place] for A/C HFrEF + A Flutter.    Assessment/Plan  1. Acute on chronic systolic CHF: NICM, exacerbation likely triggered by the onset of atrial fibrillation/flutter at least a week ago.  Echo at [facility] with EF 20%, had cardiac MRI in 2022 with no LGE, suspected nonischemic cardiomyopathy. TEE 4/5 with LV EF 20-25% with moderate RV dysfunction, moderate MR.  Milrinone started with low co-ox, decreased to 0.125 at time of DCCV.  Co-ox 63% today.  Creatinine 2 => 1.89 => 2 => 2. She diuresed well yesterday with weight down 5 lbs.  CVP lower at 9-10 but still short of breath.   - Continue milrinone 0.125 today.  - Would like to diurese at least 1 more day, will not give metolazone today.  Continue Lasix gtt 12 mg/hr and replace K.  - With elevated creatinine and soft BP (SBP 90s), no room to add GDMT currently.  2. Atypical atrial flutter/fibrillation: Now back in NSR s/p DCCV on 4/5.  Frequent PACs and episodes of Wenckebach periodicity.  - Continue amiodarone gtt 30 mg/hr while on milrinone.  - Continue eliquis 2.5 mg twice a day  - TSH elevated, continue synthroid - Hopefully rhythm will be more stable when we stop milrinone completely, possibly tomorrow.  If AF recurs, would repeat DCCV off milrinone.  3. AKI on CKD Stage 3:  3 months ago creatinine 1.4>>>2.2 at [facility]. Creatinine 2 today.   - avoid hypotension - Daily BMET  - Limits GDMT.  4. H/O Lymphoma: Completed chemotherapy 5 years ago.  5. GOC: DNR  Length of Stay: 4  Marca Ancona, MD  06/06/2022, 9:30 AM  Advanced Heart Failure Team Pager [phone] (M-F; 7a - 5p)  Please contact [facility] for night-coverage after hours (5p -7a ) and weekends on [email or web address]

## 2022-06-06 NOTE — Progress Notes (Signed)
Patient reporting pain on her legs. Patient staing that her unna boots are causing pain. Notified MD. Per MD ok to remove unna boots.

## 2022-06-06 NOTE — Progress Notes (Signed)
Patient tearfully calling out stating that no one had been in her room all morning and she was laying her room naked. This nurse went into the room and found the patient laying in her bed fully clothed with covers on her. This nurse administered medications and addressed any concerns of this patient.

## 2022-06-06 NOTE — Progress Notes (Signed)
Patient tearfully calling out stating no one had been in her room all morning and no one did anything for her.  This nurse tried to address all concerns and request at this time.

## 2022-06-06 NOTE — Plan of Care (Signed)
  Problem: Nutrition: Goal: Adequate nutrition will be maintained Outcome: Completed/Met   Problem: Elimination: Goal: Will not experience complications related to bowel motility Outcome: Completed/Met Goal: Will not experience complications related to urinary retention Outcome: Completed/Met   

## 2022-06-07 ENCOUNTER — Encounter (HOSPITAL_COMMUNITY): Payer: Self-pay | Admitting: Cardiology

## 2022-06-07 DIAGNOSIS — I5023 Acute on chronic systolic (congestive) heart failure: Secondary | ICD-10-CM | POA: Diagnosis not present

## 2022-06-07 LAB — COOXEMETRY PANEL
Carboxyhemoglobin: 2.9 % — ABNORMAL HIGH (ref 0.5–1.5)
Carboxyhemoglobin: 2.6 % — ABNORMAL HIGH (ref 0.5–1.5)
Methemoglobin: <0.7 % (ref 0.0–1.5)
Methemoglobin: <0.7 % (ref 0.0–1.5)
O2 Saturation: 59.8 %
O2 Saturation: 56.5 %
Total hemoglobin: 14.8 g/dL (ref 12.0–16.0)
Total hemoglobin: 15.8 g/dL (ref 12.0–16.0)

## 2022-06-07 LAB — CBC
HCT: 44.8 % (ref 36.0–46.0)
Hemoglobin: 14.8 g/dL (ref 12.0–15.0)
MCH: 34 pg (ref 26.0–34.0)
MCHC: 33 g/dL (ref 30.0–36.0)
MCV: 103 fL — ABNORMAL HIGH (ref 80.0–100.0)
Platelets: 213 10*3/uL (ref 150–400)
RBC: 4.35 MIL/uL (ref 3.87–5.11)
RDW: 13.9 % (ref 11.5–15.5)
WBC: 6.8 10*3/uL (ref 4.0–10.5)
nRBC: 0 % (ref 0.0–0.2)

## 2022-06-07 LAB — BASIC METABOLIC PANEL
Anion gap: 17 — ABNORMAL HIGH (ref 5–15)
BUN: 44 mg/dL — ABNORMAL HIGH (ref 8–23)
CO2: 32 mmol/L (ref 22–32)
Calcium: 8.6 mg/dL — ABNORMAL LOW (ref 8.9–10.3)
Chloride: 83 mmol/L — ABNORMAL LOW (ref 98–111)
Creatinine, Ser: 2.03 mg/dL — ABNORMAL HIGH (ref 0.44–1.00)
GFR, Estimated: 24 mL/min — ABNORMAL LOW (ref >60)
Glucose, Bld: 92 mg/dL (ref 70–99)
Potassium: 3.6 mmol/L (ref 3.5–5.1)
Sodium: 132 mmol/L — ABNORMAL LOW (ref 135–145)

## 2022-06-07 LAB — LACTIC ACID, PLASMA
Lactic Acid, Venous: 3.4 mmol/L (ref 0.5–1.9)
Lactic Acid, Venous: 2 mmol/L (ref 0.5–1.9)

## 2022-06-07 LAB — MAGNESIUM: Magnesium: 2.2 mg/dL (ref 1.7–2.4)

## 2022-06-07 LAB — AMMONIA: Ammonia: 16 umol/L (ref 9–35)

## 2022-06-07 LAB — ECHO TEE

## 2022-06-07 LAB — PROCALCITONIN: Procalcitonin: 0.21 ng/mL

## 2022-06-07 MED ORDER — POTASSIUM CHLORIDE CRYS ER 20 MEQ PO TBCR
40.0000 meq | EXTENDED_RELEASE_TABLET | Freq: Once | ORAL | Status: AC
  Administered 2022-06-07: 40 meq via ORAL
  Filled 2022-06-07: qty 2

## 2022-06-07 NOTE — Progress Notes (Signed)
Rapid Response Note  Called for hypotension and lethargy. SBP 70-90s. Per order, lasix gtt stopped. Patient oriented x4, though eyes remained closed and some prompting required. Pupils 3 round brisk. Upon MAR review, patient received melatonin 3mg  0040 and flexeril mg 0100, both for first time and neither which she takes at home.  Alexis Gore NP to bedside. Call back for further needs.   Scarlette Slice  Rapid Response Nurse

## 2022-06-07 NOTE — Progress Notes (Signed)
Occupational Therapy Treatment Patient Details Name: Alexis Stephens MRN: [id number] DOB: 07/16/1935 Today's Date: 06/07/2022   History of present illness 87 yo female presents to [facility] from [facility] on 4/3 for acute on chronic HF, aflutter with RVR. S/p TEE, cardiovesrsion on 4/5. PMH includes HFrEF, nonischemic cardiomypathy, CKD 3, GERD, history of lymphoma status postchemotherapy 5 years ago.   OT comments  Pt limited by soft BP this session and increased BLE pain/dizziness. Pt able to complete bed mobility with min A, and min A for lateral scooting at EOB. Pt able to sit EOB x10 min for grooming task. Pt symptomatic, and +orthostatic, see below. Pt presenting with impairments listed below, will follow acutely. Continue to recommend HHOT at d/c.   BP supine 87/61 (67) BP seated EOB 83/56 (66) BP seated x5 min EOB 98/72 (77)    Recommendations for follow up therapy are one component of a multi-disciplinary discharge planning process, led by the attending physician.  Recommendations may be updated based on patient status, additional functional criteria and insurance authorization.    Assistance Recommended at Discharge Intermittent Supervision/Assistance  Patient can return home with the following  A little help with walking and/or transfers;Assistance with cooking/housework;A lot of help with bathing/dressing/bathroom;Assist for transportation;Help with stairs or ramp for entrance   Equipment Recommendations  None recommended by OT (pt has all needed DME)    Recommendations for Other Services PT consult    Precautions / Restrictions Precautions Precautions: Fall Precaution Comments: watch sats, HR Restrictions Weight Bearing Restrictions: No       Mobility Bed Mobility Overal bed mobility: Needs Assistance Bed Mobility: Supine to Sit, Sit to Supine     Supine to sit: Min assist Sit to supine: Min assist   General bed mobility comments: min A for trunk  elevation    Transfers Overall transfer level: Needs assistance   Transfers: Bed to chair/wheelchair/BSC            Lateral/Scoot Transfers: Min assist General transfer comment: scooting toward [facility]     Balance Overall balance assessment: Needs assistance Sitting-balance support: No upper extremity supported, Feet supported Sitting balance-Leahy Scale: Fair                                     ADL either performed or assessed with clinical judgement   ADL Overall ADL's : Needs assistance/impaired     Grooming: Oral care;Set up;Sitting Grooming Details (indicate cue type and reason): seated EOB                                    Extremity/Trunk Assessment Upper Extremity Assessment Upper Extremity Assessment: Generalized weakness   Lower Extremity Assessment Lower Extremity Assessment: Defer to PT evaluation        Vision   Vision Assessment?: No apparent visual deficits   Perception Perception Perception: Not tested   Praxis Praxis Praxis: Not tested    Cognition Arousal/Alertness: Awake/alert Behavior During Therapy: WFL for tasks assessed/performed Overall Cognitive Status: Within Functional Limits for tasks assessed                                          Exercises      Shoulder Instructions  General Comments + orthostatic, see note    Pertinent Vitals/ Pain       Pain Assessment Pain Assessment: No/denies pain  Home Living                                          Prior Functioning/Environment              Frequency  Min 2X/week        Progress Toward Goals  OT Goals(current goals can now be found in the care plan section)  Progress towards OT goals: Not progressing toward goals - comment (limited by BP)  Acute Rehab OT Goals Patient Stated Goal: none stated OT Goal Formulation: With patient Time For Goal Achievement: 06/19/22 Potential to Achieve  Goals: Good ADL Goals Pt Will Perform Lower Body Dressing: with modified independence;sitting/lateral leans;sit to/from stand;with adaptive equipment Pt Will Perform Tub/Shower Transfer: Shower transfer;with modified independence;ambulating;shower seat Additional ADL Goal #1: pt will verbalize 3 energy conservation strategies in prep for ADLs  Plan Discharge plan remains appropriate;Frequency remains appropriate    Co-evaluation                 AM-PAC OT "6 Clicks" Daily Activity     Outcome Measure   Help from another person eating meals?: None Help from another person taking care of personal grooming?: A Little Help from another person toileting, which includes using toliet, bedpan, or urinal?: A Little Help from another person bathing (including washing, rinsing, drying)?: A Lot Help from another person to put on and taking off regular upper body clothing?: A Little Help from another person to put on and taking off regular lower body clothing?: A Lot 6 Click Score: 17    End of Session    OT Visit Diagnosis: Unsteadiness on feet (R26.81);Other abnormalities of gait and mobility (R26.89);Muscle weakness (generalized) (M62.81)   Activity Tolerance Patient tolerated treatment well   Patient Left in bed;with call bell/phone within reach;with bed alarm set;with nursing/sitter in room   Nurse Communication Mobility status        Time: 4680-3212 OT Time Calculation (min): 27 min  Charges: OT General Charges $OT Visit: 1 Visit OT Treatments $Self Care/Home Management : 8-22 mins $Therapeutic Activity: 8-22 mins  Carver Fila, OTD, OTR/L SecureChat Preferred Acute Rehab [phone]   Carver Fila Koonce 06/07/2022, 4:48 PM

## 2022-06-07 NOTE — Care Management Important Message (Signed)
Important Message  Patient Details  Name: Alexis Stephens MRN: [id number] Date of Birth: [birth year]   Medicare Important Message Given:  Yes     Renie Ora 06/07/2022, 9:22 AM

## 2022-06-07 NOTE — Progress Notes (Addendum)
Patient ID: Alexis SimmerElizabeth Ann Boliske Stephens, female   DOB: 01/27/1936, 87 y.o.   MRN: [id number]     Advanced Heart Failure Rounding Note  PCP-Cardiologist: Gypsy Balsamobert Krasowski, MD   Subjective:    Patient continues on Lasix gtt 12 mg/hr + milrinone 0.125, got dose of metolazone 2.5 4/6.  Good diuresis, weight down 8 lbs.  Co-ox 60%.  CVP remains 9-10 this morning.  Creatinine 1.89 => 2 => 2.   Remains in NSR, s/p DCCV 4/5  Remains intermittently confused. Slightly dizzy this morning but felt fine while I was at the bedside. Denies CP/SOB.  Objective:   Weight Range: 70.9 kg Body mass index is 28.59 kg/m.   Vital Signs:   Temp:  [97.6 F (36.4 C)-97.9 F (36.6 C)] 97.7 F (36.5 C) (04/08 0437) Pulse Rate:  [100-111] 100 (04/08 0437) Resp:  [18] 18 (04/08 0437) BP: (90-100)/(59-81) 98/66 (04/08 0437) SpO2:  [92 %-97 %] 97 % (04/08 0437) Weight:  [70.9 kg] 70.9 kg (04/08 0437) Last BM Date : 06/06/22  Weight change: Filed Weights   06/05/22 0416 06/06/22 0500 06/07/22 0437  Weight: 77 kg 74.4 kg 70.9 kg    Intake/Output:   Intake/Output Summary (Last 24 hours) at 06/07/2022 0812 Last data filed at 06/07/2022 0700 Gross per 24 hour  Intake 1309.29 ml  Output 2800 ml  Net -1490.71 ml    Physical Exam   CVP 9-10 General:  well appearing.  No respiratory difficulty HEENT: normal Neck: supple. JVD ~9 cm. Carotids 2+ bilat; no bruits. No lymphadenopathy or thyromegaly appreciated. Cor: PMI nondisplaced. Regular rate & rhythm. No rubs, gallops or murmurs. Port R chest Lungs: clear Abdomen: soft, nontender, nondistended. No hepatosplenomegaly. No bruits or masses. Good bowel sounds. Extremities: no cyanosis, clubbing, rash, trace BLE edema.  Neuro: alert & oriented x 3, cranial nerves grossly intact. moves all 4 extremities w/o difficulty. Affect pleasant.  Telemetry   ST 110s (personally reviewed)   EKG    No new EKG to review  Labs    CBC Recent Labs    06/07/22 0426   WBC 6.8  HGB 14.8  HCT 44.8  MCV 103.0*  PLT 213    Basic Metabolic Panel Recent Labs    40/98/1102/09/22 0650 06/07/22 0426  NA 132* 132*  K 3.7 3.6  CL 85* 83*  CO2 32 32  GLUCOSE 99 92  BUN 42* 44*  CREATININE 2.02* 2.03*  CALCIUM 8.9 8.6*  MG 2.3 2.2   Liver Function Tests No results for input(s): "AST", "ALT", "ALKPHOS", "BILITOT", "PROT", "ALBUMIN" in the last 72 hours.  No results for input(s): "LIPASE", "AMYLASE" in the last 72 hours. Cardiac Enzymes No results for input(s): "CKTOTAL", "CKMB", "CKMBINDEX", "TROPONINI" in the last 72 hours.  BNP: BNP (last 3 results) Recent Labs    06/02/22 1602  BNP >4,500.0*   ProBNP (last 3 results) Recent Labs    10/30/21 1359 01/29/22 1406 05/25/22 1454  PROBNP 22,916* 47,149* 38,570*   D-Dimer No results for input(s): "DDIMER" in the last 72 hours. Hemoglobin A1C No results for input(s): "HGBA1C" in the last 72 hours. Fasting Lipid Panel No results for input(s): "CHOL", "HDL", "LDLCALC", "TRIG", "CHOLHDL", "LDLDIRECT" in the last 72 hours. Thyroid Function Tests No results for input(s): "TSH", "T4TOTAL", "T3FREE", "THYROIDAB" in the last 72 hours.  Invalid input(s): "FREET3"  Other results:  Imaging    No results found.   Medications:     Scheduled Medications:  apixaban  2.5 mg Oral BID  Chlorhexidine Gluconate Cloth  6 each Topical Daily   levothyroxine  25 mcg Oral Q0600   melatonin  3 mg Oral QHS   sodium chloride flush  10-40 mL Intracatheter Q12H   sodium chloride flush  3 mL Intravenous Q12H    Infusions:  sodium chloride     amiodarone 30 mg/hr (06/07/22 0605)   furosemide (LASIX) 200 mg in dextrose 5 % 100 mL (2 mg/mL) infusion 12 mg/hr (06/07/22 0035)   milrinone 0.125 mcg/kg/min (06/06/22 1846)    PRN Medications: sodium chloride, acetaminophen, ondansetron (ZOFRAN) IV, sodium chloride flush, sodium chloride flush  Patient Profile   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 Transferred from [place] for A/C HFrEF + A Flutter.    Assessment/Plan  1. Acute on chronic systolic CHF: NICM, exacerbation likely triggered by the onset of atrial fibrillation/flutter at least a week ago.  Echo at [facility] with EF 20%, had cardiac MRI in 2022 with no LGE, suspected nonischemic cardiomyopathy. TEE 4/5 with LV EF 20-25% with moderate RV dysfunction, moderate MR.  Milrinone started with low co-ox, decreased to 0.125 at time of DCCV.  Co-ox 60% today.  Creatinine 2 => 1.89 => 2 => 2. She diuresed well yesterday with weight down 8 lbs.  CVP same 9/10 today  - Continue milrinone 0.125 today.  - Will stop lasix gtt for now with dizziness. Plan to start PO diuretics later vs tomorrow. Was on 120am/80pm lasix at home, suspect 80/80 Torsemide (will discuss with team) - With elevated creatinine and soft BP (SBP 90s), no room to add GDMT currently.  2. Atypical atrial flutter/fibrillation: Now back in NSR s/p DCCV on 4/5.  Frequent PACs and episodes of Wenckebach periodicity.  - Continue amiodarone gtt 30 mg/hr while on milrinone.  - Continue eliquis 2.5 mg twice a day  - TSH elevated, continue synthroid - Hopefully rhythm will be more stable when we stop milrinone completely, possibly tomorrow.  If AF recurs, would repeat DCCV off milrinone.  3. AKI on CKD Stage 3:  3 months ago creatinine 1.4>>>2.2 at [facility]. Creatinine 2 today.   - avoid hypotension - Daily BMET  - Limits GDMT.  4. H/O Lymphoma: Completed chemotherapy 5 years ago.  5. GOC: DNR 6. Lethargy - new this morning, got flexeril and melatonin late yesterday/early this am for the first time.  - suspect 2/2 meds  Length of Stay: 5  Alen Bleacher, NP  06/07/2022, 8:12 AM  Advanced Heart Failure Team Pager [phone] (M-F; 7a - 5p)  Please contact [facility] for night-coverage after hours (5p -7a ) and  weekends on [email or web address]  Patient seen and examined with the above-signed Advanced Practice Provider and/or Housestaff. I personally reviewed laboratory data, imaging studies and relevant notes. I independently examined the patient and formulated the important aspects of the plan. I have edited the note to reflect any of my changes or salient points. I have personally discussed the plan with the patient and/or family.  Remains on milrinone, IV amio and lasix gtt. Good diuresis overnight. This am was confused and somewhat somnolent after flexeril. Now more alert.   Co-ox 60% JVP 10   Labs checked NH3 ok but lactate up. SBP running in 80s  General: Elderly. sitting up in bed  No resp difficulty HEENT: normal Neck: supple. JVP 10. Carotids 2+ bilat; no bruits. No  lymphadenopathy or thryomegaly appreciated. Cor: PMI nondisplaced. Regular tachy No rubs, gallops or murmurs. Lungs: clear Abdomen: soft, nontender, nondistended. No hepatosplenomegaly. No bruits or masses. Good bowel sounds. Extremities: no cyanosis, clubbing, rash, tr edema Neuro: alert & orientedx3, cranial nerves grossly intact. moves all 4 extremities w/o difficulty. Affect pleasant  She remains tenuous. Etiology of lactic acidosis unclear. Concern for low output. Hold lasix. Continue milrinone. Recheck co-ox. Check PCT. Repeat lactate this afternoon. If acidosis worsening may need RHC.   Arvilla Meres, MD  11:56 AM

## 2022-06-07 NOTE — TOC Transition Note (Signed)
Pt was not discharged over the week-end. Discharge meds (#2) are now stored at TOC pharmacy. 

## 2022-06-08 DIAGNOSIS — I5023 Acute on chronic systolic (congestive) heart failure: Secondary | ICD-10-CM | POA: Diagnosis not present

## 2022-06-08 LAB — LACTIC ACID, PLASMA: Lactic Acid, Venous: 1.8 mmol/L (ref 0.5–1.9)

## 2022-06-08 LAB — CBC
HCT: 41.5 % (ref 36.0–46.0)
Hemoglobin: 14.1 g/dL (ref 12.0–15.0)
MCH: 34.5 pg — ABNORMAL HIGH (ref 26.0–34.0)
MCHC: 34 g/dL (ref 30.0–36.0)
MCV: 101.5 fL — ABNORMAL HIGH (ref 80.0–100.0)
Platelets: 183 10*3/uL (ref 150–400)
RBC: 4.09 MIL/uL (ref 3.87–5.11)
RDW: 13.9 % (ref 11.5–15.5)
WBC: 6.7 10*3/uL (ref 4.0–10.5)
nRBC: 0 % (ref 0.0–0.2)

## 2022-06-08 LAB — BASIC METABOLIC PANEL
Anion gap: 14 (ref 5–15)
BUN: 49 mg/dL — ABNORMAL HIGH (ref 8–23)
CO2: 31 mmol/L (ref 22–32)
Calcium: 8.4 mg/dL — ABNORMAL LOW (ref 8.9–10.3)
Chloride: 85 mmol/L — ABNORMAL LOW (ref 98–111)
Creatinine, Ser: 2.22 mg/dL — ABNORMAL HIGH (ref 0.44–1.00)
GFR, Estimated: 21 mL/min — ABNORMAL LOW (ref >60)
Glucose, Bld: 157 mg/dL — ABNORMAL HIGH (ref 70–99)
Potassium: 4 mmol/L (ref 3.5–5.1)
Sodium: 130 mmol/L — ABNORMAL LOW (ref 135–145)

## 2022-06-08 LAB — MAGNESIUM: Magnesium: 2.1 mg/dL (ref 1.7–2.4)

## 2022-06-08 LAB — COOXEMETRY PANEL
Carboxyhemoglobin: 3.1 % — ABNORMAL HIGH (ref 0.5–1.5)
Methemoglobin: <0.7 % (ref 0.0–1.5)
O2 Saturation: 66.3 %
Total hemoglobin: 14.3 g/dL (ref 12.0–16.0)

## 2022-06-08 NOTE — Consult Note (Signed)
   [facility] CM Inpatient Consult   06/08/2022  Tamishia Dante Florena Gurkin 09/12/1935 [id number]  [facility] [[facility]]  Accountable Care Organization [ACO] Patient: Medicare ACO REACH  Primary Care Provider:  Noni Saupe, MD, [facility]   Patient screened for hospitalization as discussed in unit progression meeting and to assess for potential [facility]  [[facility]] Care Management service needs for post hospital transition for care coordination.  Review of patient's electronic medical record reveals patient is from home alone noted.  Met with the patient at the bedside.  She endorses her PCP and Cardiology team.  She states she was better today and less "I don't know how to describe it but better today." Explained reason of rounding visit and she states good support. She was glad to hear of the post hospital follow up calls and was given a 24 hour nurse advise line and an appointment reminder card.   Plan:  Continue to follow progress and disposition to assess for post hospital community care coordination/management needs.  Referral request for community care coordination: pending on disposition needs.  Of note, [facility] Care Management/Population Health does not replace or interfere with any arrangements made by the Inpatient Transition of Care team.  For questions contact:   Charlesetta Shanks, RN BSN CCM [facility]  [phone] business mobile phone Toll free office [phone]  *Concierge Line  [phone] Fax number: [phone] [email or web address]

## 2022-06-08 NOTE — Progress Notes (Signed)
Physical Therapy Treatment Patient Details Name: Alexis Stephens MRN: [id number] DOB: 17-Mar-1935 Today's Date: 06/08/2022   History of Present Illness 87 yo female presents to [facility] from [place] [facility] on 4/3 for acute on chronic HF, aflutter with RVR. S/p TEE, cardiovesrsion on 4/5. PMH includes HFrEF, nonischemic cardiomypathy, CKD 3, GERD, history of lymphoma status postchemotherapy 5 years ago.    PT Comments    Pt with fair tolerance to treatment today. Pt was limited to just standing EOB with RW due to symptomatic low BP. BP: 92/61 supine, BP: 104/69 seated EOB, BP: 94/71 seated after standing, and BP: 93/63 supine. Pt declined further mobility. Anticipate that pt will be able to mobilize much better once BP is under control. No change in DC/DME recs at this time. PT will continue to follow.  Recommendations for follow up therapy are one component of a multi-disciplinary discharge planning process, led by the attending physician.  Recommendations may be updated based on patient status, additional functional criteria and insurance authorization.  Follow Up Recommendations       Assistance Recommended at Discharge Intermittent Supervision/Assistance  Patient can return home with the following A little help with walking and/or transfers;A little help with bathing/dressing/bathroom   Equipment Recommendations  None recommended by PT    Recommendations for Other Services       Precautions / Restrictions Precautions Precautions: Fall Precaution Comments: watch sats, HR Restrictions Weight Bearing Restrictions: No     Mobility  Bed Mobility Overal bed mobility: Needs Assistance Bed Mobility: Supine to Sit, Sit to Supine     Supine to sit: Min guard Sit to supine: Supervision   General bed mobility comments: Cues for bed rail use    Transfers Overall transfer level: Needs assistance Equipment used: Rolling walker (2 wheels) Transfers: Sit to/from Stand Sit  to Stand: Min assist                Ambulation/Gait               General Gait Details: Deferred due to symptomatic low BP   Stairs             Wheelchair Mobility    Modified Rankin (Stroke Patients Only)       Balance Overall balance assessment: Needs assistance Sitting-balance support: No upper extremity supported, Feet supported Sitting balance-Leahy Scale: Fair Sitting balance - Comments: Able to sit EOB                                    Cognition Arousal/Alertness: Awake/alert Behavior During Therapy: WFL for tasks assessed/performed Overall Cognitive Status: Within Functional Limits for tasks assessed                                          Exercises      General Comments General comments (skin integrity, edema, etc.): BP: 92/61 supine, BP: 104/69 seated EOB, BP: 94/71 seated after standing, and BP: 93/63 supine.      Pertinent Vitals/Pain Pain Assessment Pain Assessment: No/denies pain    Home Living                          Prior Function            PT Goals (current goals can  now be found in the care plan section) Progress towards PT goals: Progressing toward goals    Frequency    Min 1X/week      PT Plan Current plan remains appropriate    Co-evaluation              AM-PAC PT "6 Clicks" Mobility   Outcome Measure  Help needed turning from your back to your side while in a flat bed without using bedrails?: A Little Help needed moving from lying on your back to sitting on the side of a flat bed without using bedrails?: A Little Help needed moving to and from a bed to a chair (including a wheelchair)?: A Little Help needed standing up from a chair using your arms (e.g., wheelchair or bedside chair)?: A Little Help needed to walk in hospital room?: A Little Help needed climbing 3-5 steps with a railing? : A Lot 6 Click Score: 17    End of Session Equipment Utilized  During Treatment: Gait belt Activity Tolerance: Patient limited by fatigue;Treatment limited secondary to medical complications (Comment) (Symptomatic low BP) Patient left: in bed;with call bell/phone within reach Nurse Communication: Mobility status;Other (comment) (BP) PT Visit Diagnosis: Other abnormalities of gait and mobility (R26.89);Muscle weakness (generalized) (M62.81)     Time: 5400-8676 PT Time Calculation (min) (ACUTE ONLY): 16 min  Charges:  $Therapeutic Activity: 8-22 mins                     Shela Nevin, PT, DPT Acute Rehab Services [phone]    Gladys Damme 06/08/2022, 4:05 PM

## 2022-06-08 NOTE — Progress Notes (Addendum)
Patient ID: Alexis Stephens, female   DOB: 05-Aug-1935, 87 y.o.   MRN: [id number]     Advanced Heart Failure Rounding Note  PCP-Cardiologist: Gypsy Balsam, MD   Subjective:   Remains in NSR, s/p DCCV 4/5  Lasix gtt held yesterday, CVP 7/8. Continues on milrinone 0.125. Co-ox 66%. Creatinine 1.89 => 2 => 2=> 2.2.   Lactic acid 3.4>2. Procal .21. Ammonia 16. WBC 6.7.  Mentation seems ok this morning. Feels lightheaded with small movements. Now off all GDMT with soft BP.  Objective:   Weight Range: 70.9 kg Body mass index is 28.59 kg/m.   Vital Signs:   Temp:  [97.4 F (36.3 C)-97.9 F (36.6 C)] 97.5 F (36.4 C) (04/09 0500) Pulse Rate:  [99-117] 100 (04/08 1900) Resp:  [18] 18 (04/08 1900) BP: (90-111)/(44-94) 92/58 (04/09 0500) SpO2:  [92 %-97 %] 93 % (04/08 1900) Last BM Date : 06/06/22  Weight change: Filed Weights   06/05/22 0416 06/06/22 0500 06/07/22 0437  Weight: 77 kg 74.4 kg 70.9 kg    Intake/Output:   Intake/Output Summary (Last 24 hours) at 06/08/2022 0736 Last data filed at 06/08/2022 0549 Gross per 24 hour  Intake 1577.64 ml  Output 900 ml  Net 677.64 ml    Physical Exam   CVP 7/8 General:  elderly appearing.  No respiratory difficulty HEENT: normal Neck: supple. JVD ~8 cm. Carotids 2+ bilat; no bruits. No lymphadenopathy or thyromegaly appreciated. Cor: PMI nondisplaced. Regular rate & rhythm. No rubs, gallops or murmurs. R chest port Lungs: clear Abdomen: soft, nontender, nondistended. No hepatosplenomegaly. No bruits or masses. Good bowel sounds. Extremities: no cyanosis, clubbing, rash, trace BLE edema. Wound RLE Neuro: alert & oriented x 3, cranial nerves grossly intact. moves all 4 extremities w/o difficulty. Affect pleasant.  Telemetry   NSR-ST 90s-100s (personally reviewed)   EKG    No new EKG to review  Labs    CBC Recent Labs    06/07/22 0426 06/08/22 0415  WBC 6.8 6.7  HGB 14.8 14.1  HCT 44.8 41.5  MCV 103.0*  101.5*  PLT 213 183    Basic Metabolic Panel Recent Labs    67/67/20 0426 06/08/22 0415  NA 132* 130*  K 3.6 4.0  CL 83* 85*  CO2 32 31  GLUCOSE 92 157*  BUN 44* 49*  CREATININE 2.03* 2.22*  CALCIUM 8.6* 8.4*  MG 2.2 2.1   Liver Function Tests No results for input(s): "AST", "ALT", "ALKPHOS", "BILITOT", "PROT", "ALBUMIN" in the last 72 hours.  No results for input(s): "LIPASE", "AMYLASE" in the last 72 hours. Cardiac Enzymes No results for input(s): "CKTOTAL", "CKMB", "CKMBINDEX", "TROPONINI" in the last 72 hours.  BNP: BNP (last 3 results) Recent Labs    06/02/22 1602  BNP >4,500.0*   ProBNP (last 3 results) Recent Labs    10/30/21 1359 01/29/22 1406 05/25/22 1454  PROBNP 22,916* 47,149* 38,570*   D-Dimer No results for input(s): "DDIMER" in the last 72 hours. Hemoglobin A1C No results for input(s): "HGBA1C" in the last 72 hours. Fasting Lipid Panel No results for input(s): "CHOL", "HDL", "LDLCALC", "TRIG", "CHOLHDL", "LDLDIRECT" in the last 72 hours. Thyroid Function Tests No results for input(s): "TSH", "T4TOTAL", "T3FREE", "THYROIDAB" in the last 72 hours.  Invalid input(s): "FREET3"  Other results:  Imaging    No results found.   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 Transferred  from [facility] for A/C HFrEF + A Flutter.    Assessment/Plan  1. Acute on chronic systolic CHF: NICM, exacerbation likely triggered by the onset of atrial fibrillation/flutter at least a week ago.  [facility]%, had cardiac MRI in 2022 with no LGE, suspected nonischemic cardiomyopathy. TEE 4/5 with LV EF 20-25% with moderate RV dysfunction, moderate MR.  Milrinone started with low co-ox, decreased to 0.125 at time of DCCV.  Co-ox 66% today.  Creatinine 2 => 1.89 => 2 => 2=> 2.2. Now off lasix gtt.  CVP 7/8 today  - Continue milrinone 0.125 today, may need dual inotrope's with rise in SCr despite holding diuretics yesterday - lasix gtt stopped yesterday, hold diuretics through today - With elevated creatinine and soft BP (SBP 90s), no room to add GDMT currently.  - check orthostatics 2. Atypical atrial flutter/fibrillation: Now back in NSR s/p DCCV on 4/5.  Frequent PACs and episodes of Wenckebach periodicity.  - Continue amiodarone gtt 30 mg/hr while on milrinone.  - Continue eliquis 2.5 mg twice a day  - TSH elevated, continue synthroid - Hopefully rhythm will be more stable when we stop milrinone completely.  If AF recurs, would repeat DCCV off milrinone.  3. AKI on CKD Stage 3:  3 months ago creatinine 1.4>>>2.2 at [facility]. Creatinine 2>2.2 today.   - avoid hypotension - Daily BMET  - Limits GDMT.  4. H/O Lymphoma: Completed chemotherapy 5 years ago.  5. GOC: DNR 6. Lethargy - resolved, suspect it was 2/2 new flexeril and melatonin.  - No further flexeril 7. Lactic acidosis - unknown etiology - 4/8 LA 3.4>2.  - recheck lactic acid this morning - procal .21, ammonia 16, WBC 6.7. afebrile.  8. Hyponatremia - down to 130 today, monitor - restrict free water  Length of Stay: 6  Alen BleacherAlma L Diaz, NP  06/08/2022, 7:36 AM  Advanced Heart Failure Team Pager [phone] (M-F; 7a - 5p)  Please contact [facility] for night-coverage after hours (5p -7a ) and weekends on  [email or web address]  Patient seen and examined with the above-signed Advanced Practice Provider and/or Housestaff. I personally reviewed laboratory data, imaging studies and relevant notes. I independently examined the patient and formulated the important aspects of the plan. I have edited the note to reflect any of my changes or salient points. I have personally discussed the plan with the patient and/or family.  Remains on milrinone. Developed lactic acidosis yesterday. Diuretics held. Co-ox 66% today. CVP 7-8. Remains in NSR on IV amio. Feels weak but no CP, SOB, orthopnea or PND  General:  Weak appearing. No resp difficulty HEENT: normal Neck: supple. JVP 7-8 Carotids 2+ bilat; no bruits. No lymphadenopathy or thryomegaly appreciated. Cor: PMI nondisplaced. Regular rmildly tachy No rubs, gallops or murmurs. Lungs: clear Abdomen: soft, nontender, nondistended. No hepatosplenomegaly. No bruits or masses. Good  bowel sounds. Extremities: no cyanosis, clubbing, rash, edema Neuro: alert & orientedx3, cranial nerves grossly intact. moves all 4 extremities w/o difficulty. Affect pleasant  Remains tenuous but lactic acidosis has resolved. Would continue milrinone one more day. Hold diuretics. If stable overnight, will attempt to wean milrinone in am. Continue Eliquis. Can switch amio to po tomorrow.   Arvilla Meres, MD  1:36 PM

## 2022-06-09 DIAGNOSIS — I5023 Acute on chronic systolic (congestive) heart failure: Secondary | ICD-10-CM | POA: Diagnosis not present

## 2022-06-09 LAB — BASIC METABOLIC PANEL
Anion gap: 16 — ABNORMAL HIGH (ref 5–15)
Anion gap: 15 (ref 5–15)
BUN: 39 mg/dL — ABNORMAL HIGH (ref 8–23)
BUN: 42 mg/dL — ABNORMAL HIGH (ref 8–23)
CO2: 28 mmol/L (ref 22–32)
CO2: 30 mmol/L (ref 22–32)
Calcium: 7.6 mg/dL — ABNORMAL LOW (ref 8.9–10.3)
Calcium: 9 mg/dL (ref 8.9–10.3)
Chloride: 80 mmol/L — ABNORMAL LOW (ref 98–111)
Chloride: 87 mmol/L — ABNORMAL LOW (ref 98–111)
Creatinine, Ser: 1.86 mg/dL — ABNORMAL HIGH (ref 0.44–1.00)
Creatinine, Ser: 1.97 mg/dL — ABNORMAL HIGH (ref 0.44–1.00)
GFR, Estimated: 26 mL/min — ABNORMAL LOW (ref >60)
GFR, Estimated: 25 mL/min — ABNORMAL LOW (ref >60)
Glucose, Bld: 408 mg/dL — ABNORMAL HIGH (ref 70–99)
Glucose, Bld: 97 mg/dL (ref 70–99)
Potassium: 3.2 mmol/L — ABNORMAL LOW (ref 3.5–5.1)
Potassium: 4.2 mmol/L (ref 3.5–5.1)
Sodium: 124 mmol/L — ABNORMAL LOW (ref 135–145)
Sodium: 132 mmol/L — ABNORMAL LOW (ref 135–145)

## 2022-06-09 LAB — URINALYSIS, ROUTINE W REFLEX MICROSCOPIC
Bilirubin Urine: NEGATIVE
Glucose, UA: NEGATIVE mg/dL
Hgb urine dipstick: NEGATIVE
Ketones, ur: NEGATIVE mg/dL
Nitrite: NEGATIVE
Protein, ur: NEGATIVE mg/dL
Specific Gravity, Urine: 1.006 (ref 1.005–1.030)
pH: 7 (ref 5.0–8.0)

## 2022-06-09 LAB — CBC
HCT: 40.1 % (ref 36.0–46.0)
Hemoglobin: 13.5 g/dL (ref 12.0–15.0)
MCH: 34.2 pg — ABNORMAL HIGH (ref 26.0–34.0)
MCHC: 33.7 g/dL (ref 30.0–36.0)
MCV: 101.5 fL — ABNORMAL HIGH (ref 80.0–100.0)
Platelets: 157 10*3/uL (ref 150–400)
RBC: 3.95 MIL/uL (ref 3.87–5.11)
RDW: 13.7 % (ref 11.5–15.5)
WBC: 4.7 10*3/uL (ref 4.0–10.5)
nRBC: 0 % (ref 0.0–0.2)

## 2022-06-09 LAB — GLUCOSE, CAPILLARY
Glucose-Capillary: 96 mg/dL (ref 70–99)
Glucose-Capillary: 138 mg/dL — ABNORMAL HIGH (ref 70–99)
Glucose-Capillary: 107 mg/dL — ABNORMAL HIGH (ref 70–99)

## 2022-06-09 LAB — HEMOGLOBIN A1C
Hgb A1c MFr Bld: 5.8 % — ABNORMAL HIGH (ref 4.8–5.6)
Mean Plasma Glucose: 119.76 mg/dL

## 2022-06-09 LAB — COOXEMETRY PANEL
Carboxyhemoglobin: 2.6 % — ABNORMAL HIGH (ref 0.5–1.5)
Methemoglobin: <0.7 % (ref 0.0–1.5)
O2 Saturation: 75.6 %
Total hemoglobin: 14.5 g/dL (ref 12.0–16.0)

## 2022-06-09 LAB — MAGNESIUM: Magnesium: 1.9 mg/dL (ref 1.7–2.4)

## 2022-06-09 LAB — SODIUM: Sodium: 131 mmol/L — ABNORMAL LOW (ref 135–145)

## 2022-06-09 MED ORDER — INSULIN ASPART 100 UNIT/ML IJ SOLN
0.0000 [IU] | Freq: Three times a day (TID) | INTRAMUSCULAR | Status: DC
  Administered 2022-06-09: 1 [IU] via SUBCUTANEOUS

## 2022-06-09 MED ORDER — FUROSEMIDE 10 MG/ML IJ SOLN
40.0000 mg | Freq: Once | INTRAMUSCULAR | Status: AC
  Administered 2022-06-09: 40 mg via INTRAVENOUS
  Filled 2022-06-09: qty 4

## 2022-06-09 MED ORDER — INSULIN ASPART 100 UNIT/ML IJ SOLN: 0.0000 [IU] | Freq: Every day | INTRAMUSCULAR | Status: DC

## 2022-06-09 MED ORDER — INSULIN ASPART 100 UNIT/ML IJ SOLN: 0.0000 [IU] | Freq: Three times a day (TID) | INTRAMUSCULAR | Status: DC

## 2022-06-09 MED ORDER — POTASSIUM CHLORIDE CRYS ER 20 MEQ PO TBCR
40.0000 meq | EXTENDED_RELEASE_TABLET | Freq: Once | ORAL | Status: AC
  Administered 2022-06-09: 40 meq via ORAL
  Filled 2022-06-09: qty 2

## 2022-06-09 MED ORDER — TOLVAPTAN 15 MG PO TABS
15.0000 mg | ORAL_TABLET | Freq: Once | ORAL | Status: DC
  Filled 2022-06-09: qty 1

## 2022-06-09 MED ORDER — AMIODARONE HCL 200 MG PO TABS
200.0000 mg | ORAL_TABLET | Freq: Two times a day (BID) | ORAL | Status: DC
  Administered 2022-06-09 – 2022-06-11 (×5): 200 mg via ORAL
  Filled 2022-06-09 (×5): qty 1

## 2022-06-09 MED ORDER — INSULIN ASPART 100 UNIT/ML IJ SOLN
2.0000 [IU] | Freq: Once | INTRAMUSCULAR | Status: AC
  Administered 2022-06-09: 2 [IU] via SUBCUTANEOUS

## 2022-06-09 MED ORDER — TOLVAPTAN 15 MG PO TABS
15.0000 mg | ORAL_TABLET | Freq: Once | ORAL | Status: AC
  Administered 2022-06-09: 15 mg via ORAL
  Filled 2022-06-09: qty 1

## 2022-06-09 MED ORDER — MAGNESIUM SULFATE 2 GM/50ML IV SOLN
2.0000 g | Freq: Once | INTRAVENOUS | Status: AC
  Administered 2022-06-09: 2 g via INTRAVENOUS
  Filled 2022-06-09: qty 50

## 2022-06-09 MED ORDER — ORAL CARE MOUTH RINSE: 15.0000 mL | OROMUCOSAL | Status: DC | PRN

## 2022-06-09 NOTE — Consult Note (Signed)
   [facility] CM Inpatient Consult   06/09/2022  Alexis Stephens Alexis Stephens [birth year] [id number]  Follow up:  Post hospital planning  Met with patient in recliner, pleasant and states, "I must have been a stinker the last few days cause I really want to go home. I don't want to go to [place], [place], I worked there many years and it's a great place but I just want to go home and I promise I will behave [laughingly].  I literally live on my daughters property and she lives about I'd say 500 feet from my place."  Explained to patient she needs to get stronger.  She state she has been seeing "Bobette Mo"  the friendly Ghost at times, she states, "I don't know why I would even have him in my mind cause I never did watch the cartoon."  She was discussed with the inpatient [facility] team members of potential for SNF rehab.  Spoke with RN about talking with daughter if she visits today about planning for disposition needs.  For questions, contact,  Charlesetta Shanks, RN BSN CCM [facility]  [phone] business mobile phone Toll free office [phone]  *Concierge Line  [phone] Fax number: [phone] [email or web address]

## 2022-06-09 NOTE — Progress Notes (Addendum)
Patient ID: Alexis Stephens, female   DOB: October 07, 1935, 87 y.o.   MRN: [id number]     Advanced Heart Failure Rounding Note  PCP-Cardiologist: Gypsy Balsam, MD   Subjective:   Remains in NSR, s/p DCCV 4/5  Remains on milrinone 0.125 mcg + amio 30 mg per hour. CO-OX 76%.   Off diuretics for the last few days. Overall weight down 19 pounds.  Creatinine 2.2>1.86  Sodium 124   Denies SOB. Complaining of RLE pain.    Objective:   Weight Range: 70 kg Body mass index is 28.23 kg/m.   Vital Signs:   Temp:  [97.5 F (36.4 C)-98.3 F (36.8 C)] 97.5 F (36.4 C) (04/10 0717) Pulse Rate:  [85-106] 94 (04/10 0717) Resp:  [16-49] 18 (04/10 0717) BP: (88-110)/(54-73) 106/68 (04/10 0717) SpO2:  [90 %-98 %] 96 % (04/10 0717) Weight:  [70 kg] 70 kg (04/10 0709) Last BM Date : 06/06/22  Weight change: Filed Weights   06/07/22 0437 06/08/22 0700 06/09/22 0709  Weight: 70.9 kg 70.9 kg 70 kg    Intake/Output:   Intake/Output Summary (Last 24 hours) at 06/09/2022 0919 Last data filed at 06/09/2022 0818 Gross per 24 hour  Intake 707.18 ml  Output 875 ml  Net -167.82 ml  CVP 10-11   Physical Exam    General: Sitting in the chair. No resp difficulty HEENT: normal Neck: supple. JVP 9-10 . Carotids 2+ bilat; no bruits. No lymphadenopathy or thryomegaly appreciated. Cor: PMI nondisplaced. Regular rate & rhythm. No rubs, gallops or murmurs. Lungs: clear Abdomen: soft, nontender, nondistended. No hepatosplenomegaly. No bruits or masses. Good bowel sounds. Extremities: no cyanosis, clubbing, rash, edema. RLE dressing. Bilateral ted hose. RUE PICC  Neuro: alert & orientedx3, cranial nerves grossly intact. moves all 4 extremities w/o difficulty. Affect pleasant  Telemetry   NSR-ST 80-100s  EKG    No new EKG to review  Labs    CBC Recent Labs    06/08/22 0415 06/09/22 0530  WBC 6.7 4.7  HGB 14.1 13.5  HCT 41.5 40.1  MCV 101.5* 101.5*  PLT 183 157    Basic  Metabolic Panel Recent Labs    09/98/33 0415 06/09/22 0530  NA 130* 124*  K 4.0 3.2*  CL 85* 80*  CO2 31 28  GLUCOSE 157* 408*  BUN 49* 39*  CREATININE 2.22* 1.86*  CALCIUM 8.4* 7.6*  MG 2.1 1.9   Liver Function Tests No results for input(s): "AST", "ALT", "ALKPHOS", "BILITOT", "PROT", "ALBUMIN" in the last 72 hours.  No results for input(s): "LIPASE", "AMYLASE" in the last 72 hours. Cardiac Enzymes No results for input(s): "CKTOTAL", "CKMB", "CKMBINDEX", "TROPONINI" in the last 72 hours.  BNP: BNP (last 3 results) Recent Labs    06/02/22 1602  BNP >4,500.0*   ProBNP (last 3 results) Recent Labs    10/30/21 1359 01/29/22 1406 05/25/22 1454  PROBNP 22,916* 47,149* 38,570*   D-Dimer No results for input(s): "DDIMER" in the last 72 hours. Hemoglobin A1C No results for input(s): "HGBA1C" in the last 72 hours. Fasting Lipid Panel No results for input(s): "CHOL", "HDL", "LDLCALC", "TRIG", "CHOLHDL", "LDLDIRECT" in the last 72 hours. Thyroid Function Tests No results for input(s): "TSH", "T4TOTAL", "T3FREE", "THYROIDAB" in the last 72 hours.  Invalid input(s): "FREET3"  Other results:  Imaging    No results found.   Medications:     Scheduled Medications:  apixaban  2.5 mg Oral BID   Chlorhexidine Gluconate Cloth  6 each Topical Daily  levothyroxine  25 mcg Oral Q0600   melatonin  3 mg Oral QHS   potassium chloride  40 mEq Oral Once   sodium chloride flush  10-40 mL Intracatheter Q12H   sodium chloride flush  3 mL Intravenous Q12H    Infusions:  sodium chloride     amiodarone 30 mg/hr (06/09/22 0400)   magnesium sulfate bolus IVPB     milrinone 0.1231 mcg/kg/min (06/09/22 0400)    PRN Medications: sodium chloride, acetaminophen, ondansetron (ZOFRAN) IV, mouth rinse, sodium chloride flush, sodium chloride flush  Patient Profile   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 Transferred from [place] for A/C HFrEF + A Flutter.    Assessment/Plan  1. Acute on chronic systolic CHF: NICM, exacerbation likely triggered by the onset of atrial fibrillation/flutter at least a week ago.  Echo at [facility] with EF 20%, had cardiac MRI in 2022 with no LGE, suspected nonischemic cardiomyopathy. TEE 4/5 with LV EF 20-25% with moderate RV dysfunction, moderate MR.  Milrinone started with low co-ox, decreased to 0.125 at time of DCCV.  Co-ox 76% . Stop milrinone.  Creatinine 2.2=>1.86. CVP 10-11. Overall diuresed 19 pounds. Will need to restart diuretics. Sodium down to 124. Consider tolvaptan.     With elevated creatinine and soft BP (SBP 90s), no room to add GDMT currently.  2. Atypical atrial flutter/fibrillation: Now back in NSR s/p DCCV on 4/5.  Frequent PACs and episodes of Wenckebach periodicity.  - Maintaining SR. Stop amio drip. Start amio 200 mg twice a day. - Continue eliquis 2.5 mg twice a day  - TSH elevated, continue synthroid - Hopefully rhythm will be more stable when we stop milrinone completely.  If AF recurs, would repeat DCCV off milrinone.  3. AKI on CKD Stage 3:  3 months ago creatinine 1.4>>>2.2 at [facility]. Creatinine 2>2.2>1.86  today.   - avoid hypotension - Daily BMET  - Limits GDMT.  4. H/O Lymphoma: Completed chemotherapy 5 years ago.  5. GOC: DNR 6. Lethargy - resolved, suspect it was 2/2 new flexeril and melatonin.  - No further flexeril 7. Lactic acidosis - Resolved 1.8  8. Hyponatremia - down to 124 today, monitor. Mentation ok. Consider tolvaptan.   - restrict free water  Length of Stay: 7  Amy Clegg, NP  06/09/2022, 9:19 AM  Advanced Heart Failure Team Pager [phone] (M-F; 7a - 5p)  Please contact [facility] for night-coverage after hours (5p -7a ) and weekends on [email or web address]  Patient seen and examined with the above-signed Advanced Practice Provider and/or Housestaff. I  personally reviewed laboratory data, imaging studies and relevant notes. I independently examined the patient and formulated the important aspects of the plan. I have edited the note to reflect any of my changes or salient points. I have personally discussed the plan with the patient and/or family.  Remains on milrinone 0.125. Co-ox up to 75%. CVP 11-12. Denies SOB, orthopnea or PND. Mildly confused at times  Remains in NSR on IV amio. CBG > 400. NA 124  General:  Sitting in chair No resp difficulty HEENT: normal Neck: supple. Jvp to jaw Carotids 2+ bilat; no bruits. No lymphadenopathy or thryomegaly appreciated. Cor: PMI nondisplaced. Regular rate & rhythm. No rubs, gallops or murmurs. Lungs: clear Abdomen: soft, nontender, nondistended. No hepatosplenomegaly. No bruits or masses. Good bowel sounds. Extremities: no cyanosis, clubbing, rash, tr  edema  + TED Neuro: nonfocal   Co-ox high. Can wean milrinone. Watch closely for developing infection. Remains in NSR. Can switch amio to po now that we are stopping milrinone. Will need SNF. Suspect pseudohyponatremia in setting of hyperglycemia. Can recheck BMET.   Arvilla Meresaniel Mounir Skipper, MD  6:35 PM

## 2022-06-09 NOTE — Progress Notes (Signed)
Occupational Therapy Treatment Patient Details Name: Alexis Stephens MRN: [id number] DOB: 02/23/1936 Today's Date: 06/09/2022   History of present illness 87 yo female presents to [facility] from [facility] on 4/3 for acute on chronic HF, aflutter with RVR. S/p TEE, cardiovesrsion on 4/5. PMH includes HFrEF, nonischemic cardiomypathy, CKD 3, GERD, history of lymphoma status postchemotherapy 5 years ago.   OT comments  Pt with slow progression towards goals this session, needing mod A for UB /LB ADL, min guard-min A for transfer with RW and min A for bed mobility. Pt orthostatic with mobility and reports dizziness (see below). Pt able to complete seated therex but reports feeling weaker and more "out of it" today compared to other days. Pt presenting with impairments listed below, will follow acutely. Patient will benefit from continued inpatient follow up therapy, <3 hours/day pending activity tolerance/mobility progression.  BP supine 84/53 (54) BP seated 107/77 (77) BP seated x5 min 98/72 (78) BP post transfer to chair 89/66 (74)    Recommendations for follow up therapy are one component of a multi-disciplinary discharge planning process, led by the attending physician.  Recommendations may be updated based on patient status, additional functional criteria and insurance authorization.    Assistance Recommended at Discharge Intermittent Supervision/Assistance  Patient can return home with the following  A little help with walking and/or transfers;Assistance with cooking/housework;A lot of help with bathing/dressing/bathroom;Assist for transportation;Help with stairs or ramp for entrance   Equipment Recommendations  None recommended by OT    Recommendations for Other Services PT consult    Precautions / Restrictions Precautions Precautions: Fall Precaution Comments: watch sats, HR and BP Restrictions Weight Bearing Restrictions: No       Mobility Bed Mobility Overal  bed mobility: Needs Assistance Bed Mobility: Supine to Sit, Sit to Supine     Supine to sit: Min assist     General bed mobility comments: min A for trunk elevation    Transfers Overall transfer level: Needs assistance Equipment used: Rolling walker (2 wheels) Transfers: Sit to/from Stand, Bed to chair/wheelchair/BSC            Lateral/Scoot Transfers: Min assist       Balance Overall balance assessment: Needs assistance Sitting-balance support: No upper extremity supported, Feet supported Sitting balance-Leahy Scale: Good Sitting balance - Comments: static sitting without LOB   Standing balance support: Bilateral upper extremity supported, During functional activity, Reliant on assistive device for balance Standing balance-Leahy Scale: Poor Standing balance comment: reliant on external support                           ADL either performed or assessed with clinical judgement   ADL           Upper Body Bathing: Moderate assistance;Sitting Upper Body Bathing Details (indicate cue type and reason): simulated     Upper Body Dressing : Moderate assistance;Sitting Upper Body Dressing Details (indicate cue type and reason): simulated     Toilet Transfer: Stand-pivot;Rolling walker (2 wheels);BSC/3in1;Minimal assistance           Functional mobility during ADLs: Min guard;Rolling walker (2 wheels)      Extremity/Trunk Assessment Upper Extremity Assessment Upper Extremity Assessment: Generalized weakness   Lower Extremity Assessment Lower Extremity Assessment: Generalized weakness        Vision   Vision Assessment?: No apparent visual deficits   Perception Perception Perception: Not tested   Praxis Praxis Praxis: Not tested  Cognition Arousal/Alertness: Awake/alert Behavior During Therapy: WFL for tasks assessed/performed Overall Cognitive Status: Impaired/Different from baseline Area of Impairment: Memory, Following commands,  Attention                   Current Attention Level: Focused Memory: Decreased short-term memory Following Commands: Follows one step commands with increased time       General Comments: reports feeling more "out of it" today compared to previous days, at times needing repetition for command following HOH vs cognition?        Exercises Exercises: General Lower Extremity General Exercises - Lower Extremity Ankle Circles/Pumps: AROM, Both, 5 reps, Seated    Shoulder Instructions       General Comments orhtostatic, see note    Pertinent Vitals/ Pain       Pain Assessment Pain Assessment: Faces Pain Score: 5  Faces Pain Scale: Hurts even more Pain Location: LLE (shin) Pain Descriptors / Indicators: Discomfort Pain Intervention(s): Limited activity within patient's tolerance, Monitored during session, Repositioned  Home Living                                          Prior Functioning/Environment              Frequency  Min 2X/week        Progress Toward Goals  OT Goals(current goals can now be found in the care plan section)  Progress towards OT goals: Progressing toward goals  Acute Rehab OT Goals Patient Stated Goal: none stated OT Goal Formulation: With patient Time For Goal Achievement: 06/19/22 Potential to Achieve Goals: Good ADL Goals Pt Will Perform Lower Body Dressing: with modified independence;sitting/lateral leans;sit to/from stand;with adaptive equipment Pt Will Perform Tub/Shower Transfer: Shower transfer;with modified independence;ambulating;shower seat Additional ADL Goal #1: pt will verbalize 3 energy conservation strategies in prep for ADLs  Plan Discharge plan needs to be updated;Frequency remains appropriate    Co-evaluation                 AM-PAC OT "6 Clicks" Daily Activity     Outcome Measure   Help from another person eating meals?: None Help from another person taking care of personal  grooming?: A Little Help from another person toileting, which includes using toliet, bedpan, or urinal?: A Lot Help from another person bathing (including washing, rinsing, drying)?: A Lot Help from another person to put on and taking off regular upper body clothing?: A Little Help from another person to put on and taking off regular lower body clothing?: A Lot 6 Click Score: 16    End of Session Equipment Utilized During Treatment: Gait belt;Rolling walker (2 wheels)  OT Visit Diagnosis: Unsteadiness on feet (R26.81);Other abnormalities of gait and mobility (R26.89);Muscle weakness (generalized) (M62.81)   Activity Tolerance Patient tolerated treatment well   Patient Left in chair;with call bell/phone within reach   Nurse Communication Mobility status        Time: 2202-5427 OT Time Calculation (min): 38 min  Charges: OT General Charges $OT Visit: 1 Visit OT Treatments $Therapeutic Activity: 38-52 mins  Carver Fila, OTD, OTR/L SecureChat Preferred Acute Rehab [phone]   Carver Fila Koonce 06/09/2022, 9:21 AM

## 2022-06-09 NOTE — Inpatient Diabetes Management (Signed)
Inpatient Diabetes Program Recommendations  AACE/ADA: New Consensus Statement on Inpatient Glycemic Control (2015)  Target Ranges:  Prepandial:   less than 140 mg/dL      Peak postprandial:   less than 180 mg/dL (1-2 hours)      Critically ill patients:  140 - 180 mg/dL   No results found for: "GLUCAP", "HGBA1C"  Review of Glycemic Control  Latest Reference Range & Units 06/09/22 05:30  Glucose 70 - 99 mg/dL 728 (H)  (H): Data is abnormally high  Diabetes history: None Outpatient Diabetes medications: None Current orders for Inpatient glycemic control: None  Inpatient Diabetes Program Recommendations:    408 mg/dL likely contaminated with dextrose.  Might want to spot check CBG x 1 when serum glucose comes back elevated.    Will continue to follow while inpatient.  Thank you, Dulce Sellar, MSN, CDCES Diabetes Coordinator Inpatient Diabetes Program [phone] (team pager from 8a-5p)

## 2022-06-09 NOTE — Progress Notes (Signed)
Patient complaining of right big toe painful and these TED stockings hurting her legs. RN assessed capillary test and offered pain medicine but the patient refused. Educated patient regarding the purpose and how TED stockings work. RN removed TED stockings per patients request.

## 2022-06-10 ENCOUNTER — Other Ambulatory Visit (HOSPITAL_COMMUNITY): Payer: Self-pay

## 2022-06-10 DIAGNOSIS — I5023 Acute on chronic systolic (congestive) heart failure: Secondary | ICD-10-CM | POA: Diagnosis not present

## 2022-06-10 LAB — CBC
HCT: 43.7 % (ref 36.0–46.0)
Hemoglobin: 14.6 g/dL (ref 12.0–15.0)
MCH: 33.7 pg (ref 26.0–34.0)
MCHC: 33.4 g/dL (ref 30.0–36.0)
MCV: 100.9 fL — ABNORMAL HIGH (ref 80.0–100.0)
Platelets: 201 10*3/uL (ref 150–400)
RBC: 4.33 MIL/uL (ref 3.87–5.11)
RDW: 13.8 % (ref 11.5–15.5)
WBC: 5.4 10*3/uL (ref 4.0–10.5)
nRBC: 0 % (ref 0.0–0.2)

## 2022-06-10 LAB — BASIC METABOLIC PANEL
Anion gap: 12 (ref 5–15)
BUN: 41 mg/dL — ABNORMAL HIGH (ref 8–23)
CO2: 30 mmol/L (ref 22–32)
Calcium: 8.4 mg/dL — ABNORMAL LOW (ref 8.9–10.3)
Chloride: 90 mmol/L — ABNORMAL LOW (ref 98–111)
Creatinine, Ser: 1.79 mg/dL — ABNORMAL HIGH (ref 0.44–1.00)
GFR, Estimated: 28 mL/min — ABNORMAL LOW (ref >60)
Glucose, Bld: 84 mg/dL (ref 70–99)
Potassium: 3.6 mmol/L (ref 3.5–5.1)
Sodium: 132 mmol/L — ABNORMAL LOW (ref 135–145)

## 2022-06-10 LAB — COOXEMETRY PANEL
Carboxyhemoglobin: 2.1 % — ABNORMAL HIGH (ref 0.5–1.5)
Methemoglobin: <0.7 % (ref 0.0–1.5)
O2 Saturation: 63.1 %
Total hemoglobin: 14.8 g/dL (ref 12.0–16.0)

## 2022-06-10 LAB — GLUCOSE, CAPILLARY
Glucose-Capillary: 106 mg/dL — ABNORMAL HIGH (ref 70–99)
Glucose-Capillary: 103 mg/dL — ABNORMAL HIGH (ref 70–99)
Glucose-Capillary: 104 mg/dL — ABNORMAL HIGH (ref 70–99)
Glucose-Capillary: 105 mg/dL — ABNORMAL HIGH (ref 70–99)

## 2022-06-10 LAB — MAGNESIUM: Magnesium: 2.6 mg/dL — ABNORMAL HIGH (ref 1.7–2.4)

## 2022-06-10 MED ORDER — FUROSEMIDE 40 MG PO TABS
80.0000 mg | ORAL_TABLET | Freq: Every day | ORAL | Status: DC
  Administered 2022-06-10: 80 mg via ORAL
  Filled 2022-06-10: qty 2

## 2022-06-10 MED ORDER — TORSEMIDE 20 MG PO TABS
40.0000 mg | ORAL_TABLET | Freq: Two times a day (BID) | ORAL | Status: DC
  Administered 2022-06-10 – 2022-06-11 (×2): 40 mg via ORAL
  Filled 2022-06-10 (×2): qty 2

## 2022-06-10 MED ORDER — POTASSIUM CHLORIDE CRYS ER 20 MEQ PO TBCR
40.0000 meq | EXTENDED_RELEASE_TABLET | Freq: Once | ORAL | Status: AC
  Administered 2022-06-10: 40 meq via ORAL
  Filled 2022-06-10: qty 2

## 2022-06-10 NOTE — Progress Notes (Signed)
Physical Therapy Treatment Patient Details Name: Alexis Stephens MRN: [id number] DOB: 08/25/1935 Today's Date: 06/10/2022   History of Present Illness 87 yo female presents [facility] from [place] [facility] on 4/3 for acute on chronic HF, aflutter with RVR. S/p TEE, cardiovesrsion on 4/5. PMH includes HFrEF, nonischemic cardiomypathy, CKD 3, GERD, history of lymphoma status postchemotherapy 5 years ago.    PT Comments    Pt with fair tolerance to treatment today. Pt was again limited by symptomatic low BP however was able to transfer from bed to chair. BP: 84/50 supine, BP: 84/65 seated, BP: 97/61 seated after standing marches, BP: 94/53 in recliner. RN spoke with pt daughter on phone who states that she wants pt to go to SNF. Discussed with pt who is now agreeable to SNF and DC recs have been updated. PT will continue to follow.  Recommendations for follow up therapy are one component of a multi-disciplinary discharge planning process, led by the attending physician.  Recommendations may be updated based on patient status, additional functional criteria and insurance authorization.  Follow Up Recommendations  Can patient physically be transported by private vehicle: No    Assistance Recommended at Discharge Intermittent Supervision/Assistance  Patient can return home with the following A little help with walking and/or transfers;A little help with bathing/dressing/bathroom   Equipment Recommendations  None recommended by PT    Recommendations for Other Services       Precautions / Restrictions Precautions Precautions: Fall Precaution Comments: watch sats, HR and BP Restrictions Weight Bearing Restrictions: No     Mobility  Bed Mobility Overal bed mobility: Needs Assistance Bed Mobility: Supine to Sit     Supine to sit: Supervision     General bed mobility comments: Increased time    Transfers Overall transfer level: Needs assistance Equipment used: Rolling  walker (2 wheels) Transfers: Sit to/from Stand, Bed to chair/wheelchair/BSC Sit to Stand: Min guard, Min assist   Step pivot transfers: Min guard       General transfer comment: Pt able to perform 2 sit to stands. Pt continues to report symptomatic low BP    Ambulation/Gait             Pre-gait activities: Standing Chartered certified accountant Rankin (Stroke Patients Only)       Balance Overall balance assessment: Needs assistance Sitting-balance support: No upper extremity supported, Feet supported Sitting balance-Leahy Scale: Good Sitting balance - Comments: static sitting without LOB   Standing balance support: Bilateral upper extremity supported, During functional activity, Reliant on assistive device for balance Standing balance-Leahy Scale: Poor Standing balance comment: reliant on external support                            Cognition Arousal/Alertness: Awake/alert Behavior During Therapy: WFL for tasks assessed/performed Overall Cognitive Status: Impaired/Different from baseline Area of Impairment: Memory, Following commands, Attention                   Current Attention Level: Focused Memory: Decreased short-term memory Following Commands: Follows one step commands with increased time                Exercises      General Comments General comments (skin integrity, edema, etc.): BP: 84/50 supine, BP: 84/65 seated, BP: 97/61 seated after standing marches, BP: 94/53 in recliner  Pertinent Vitals/Pain Pain Assessment Pain Assessment: Faces Faces Pain Scale: Hurts a little bit Pain Location: LLE (shin) Pain Descriptors / Indicators: Discomfort Pain Intervention(s): Monitored during session, Limited activity within patient's tolerance    Home Living                          Prior Function            PT Goals (current goals can now be found in the care plan  section) Progress towards PT goals: Progressing toward goals    Frequency    Min 1X/week      PT Plan Discharge plan needs to be updated    Co-evaluation              AM-PAC PT "6 Clicks" Mobility   Outcome Measure  Help needed turning from your back to your side while in a flat bed without using bedrails?: A Little Help needed moving from lying on your back to sitting on the side of a flat bed without using bedrails?: A Little Help needed moving to and from a bed to a chair (including a wheelchair)?: A Little Help needed standing up from a chair using your arms (e.g., wheelchair or bedside chair)?: A Little Help needed to walk in hospital room?: A Little Help needed climbing 3-5 steps with a railing? : A Lot 6 Click Score: 17    End of Session Equipment Utilized During Treatment: Gait belt Activity Tolerance: Patient limited by fatigue;Treatment limited secondary to medical complications (Comment) (Symptomatic low BP) Patient left: in chair;with call bell/phone within reach;with nursing/sitter in room Nurse Communication: Mobility status;Other (comment) (BP) PT Visit Diagnosis: Other abnormalities of gait and mobility (R26.89);Muscle weakness (generalized) (M62.81)     Time: 4010-2725 PT Time Calculation (min) (ACUTE ONLY): 19 min  Charges:  $Therapeutic Activity: 8-22 mins                     Shela Nevin, PT, DPT Acute Rehab Services [phone]    Alexis Stephens 06/10/2022, 12:37 PM

## 2022-06-10 NOTE — Progress Notes (Addendum)
Patient ID: Wylene Simmer, female   DOB: 1935/09/22, 87 y.o.   MRN: [id number]     Advanced Heart Failure Rounding Note  PCP-Cardiologist: Gypsy Balsam, MD   Subjective:   Remains in NSR, s/p DCCV 4/5  Milrinone and amio gtt stopped yesterday. Now on PO amio.  CO-OX 63%.   Off diuretics for the last few days. Overall weight down 19 pounds. Creatinine 2.2>1.86> 1.79  Sodium 124 4/10 s/p tolvaptan, up to 132 today  Feels fine this morning. Denies CP/SOB. Ambulated in room some yesterday.   Objective:   Weight Range: 70.7 kg Body mass index is 28.51 kg/m.   Vital Signs:   Temp:  [97.6 F (36.4 C)-98.2 F (36.8 C)] 97.7 F (36.5 C) (04/11 0410) Pulse Rate:  [83-98] 83 (04/11 0410) Resp:  [18] 18 (04/11 0410) BP: (83-96)/(53-61) 91/53 (04/11 0410) SpO2:  [92 %-99 %] 92 % (04/11 0410) Weight:  [70.7 kg] 70.7 kg (04/11 0400) Last BM Date : 06/06/22  Weight change: Filed Weights   06/09/22 0709 06/10/22 0347 06/10/22 0400  Weight: 70 kg 70.7 kg 70.7 kg   Intake/Output:   Intake/Output Summary (Last 24 hours) at 06/10/2022 0845 Last data filed at 06/10/2022 0836 Gross per 24 hour  Intake 1257 ml  Output 400 ml  Net 857 ml  CVP 9  Physical Exam  General:  well appearing.  No respiratory difficulty HEENT: normal Neck: supple. JVD 9 cm. Carotids 2+ bilat; no bruits. No lymphadenopathy or thyromegaly appreciated. Cor: PMI nondisplaced. Regular rate & rhythm. No rubs, gallops or murmurs. RU port  Lungs: clear Abdomen: soft, nontender, nondistended. No hepatosplenomegaly. No bruits or masses. Good bowel sounds. Extremities: no cyanosis, clubbing, rash, edema Neuro: alert & oriented x 3, cranial nerves grossly intact. moves all 4 extremities w/o difficulty. Affect pleasant.   Telemetry   NSR 80s (Personally reviewed)    EKG    No new EKG to review  Labs    CBC Recent Labs    06/09/22 0530 06/10/22 0523  WBC 4.7 5.4  HGB 13.5 14.6  HCT 40.1  43.7  MCV 101.5* 100.9*  PLT 157 201    Basic Metabolic Panel Recent Labs    46/80/32 0530 06/09/22 1200 06/09/22 1830 06/10/22 0523  NA 124* 132* 131* 132*  K 3.2* 4.2  --  3.6  CL 80* 87*  --  90*  CO2 28 30  --  30  GLUCOSE 408* 97  --  84  BUN 39* 42*  --  41*  CREATININE 1.86* 1.97*  --  1.79*  CALCIUM 7.6* 9.0  --  8.4*  MG 1.9  --   --  2.6*   Liver Function Tests No results for input(s): "AST", "ALT", "ALKPHOS", "BILITOT", "PROT", "ALBUMIN" in the last 72 hours.  No results for input(s): "LIPASE", "AMYLASE" in the last 72 hours. Cardiac Enzymes No results for input(s): "CKTOTAL", "CKMB", "CKMBINDEX", "TROPONINI" in the last 72 hours.  BNP: BNP (last 3 results) Recent Labs    06/02/22 1602  BNP >4,500.0*   ProBNP (last 3 results) Recent Labs    10/30/21 1359 01/29/22 1406 05/25/22 1454  PROBNP 22,916* 47,149* 38,570*   D-Dimer No results for input(s): "DDIMER" in the last 72 hours. Hemoglobin A1C Recent Labs    06/09/22 0530  HGBA1C 5.8*   Fasting Lipid Panel No results for input(s): "CHOL", "HDL", "LDLCALC", "TRIG", "CHOLHDL", "LDLDIRECT" in the last 72 hours. Thyroid Function Tests No results for input(s): "TSH", "T4TOTAL", "  T3FREE", "THYROIDAB" in the last 72 hours.  Invalid input(s): "FREET3"  Other results:  Imaging    No results found.   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 Transferred from [place] for A/C HFrEF + A Flutter.    Assessment/Plan  1. Acute on chronic systolic CHF: NICM, exacerbation likely triggered by the onset of atrial fibrillation/flutter at least a week ago.  Echo at [facility] with EF 20%, had cardiac MRI in 2022 with no LGE, suspected nonischemic cardiomyopathy. TEE 4/5 with LV EF 20-25% with moderate RV dysfunction, moderate MR.  Milrinone started with low co-ox, decreased to 0.125 at time of DCCV.  Co-ox 63% . Milrinone off 4/10.  Creatinine 2.2=>1.86. CVP 9. Overall diuresed 19 pounds.  - Was on lasix 160mg  am / 80 mg pm (per patient report she took PRN). Start 40 Torsemide BID  With elevated creatinine and soft BP (SBP 90s), no room to add GDMT currently.  2. Atypical atrial flutter/fibrillation: Now back in NSR s/p DCCV on 4/5.  Frequent PACs and episodes of Wenckebach periodicity.  - Maintaining SR. Continue amio 200 mg twice a day. - Continue eliquis 2.5 mg twice a day  - TSH elevated, continue synthroid - Hopefully rhythm will be more stable when we stop milrinone completely.  If AF recurs, would repeat DCCV off milrinone.  3. AKI on CKD Stage 3:  3 months ago creatinine 1.4>>>2.2 at [facility]. Creatinine 2>2.2>1.86>1.79 today.   - avoid hypotension - Daily BMET  - Limits GDMT.  4. H/O Lymphoma: Completed chemotherapy 5 years ago.  5. GOC: DNR 6. Lethargy - resolved, suspect it was 2/2 new flexeril and melatonin.  - No further flexeril 7. Lactic acidosis - Resolved 1.8  8. Hyponatremia - down to 124 4/10. S/p tolvaptan yesterday. Na 132 today    - restrict free water  Length of Stay: 8  Alen Bleacher, NP  06/10/2022, 8:45 AM  Advanced Heart Failure Team Pager [phone] (M-F; 7a - 5p)  Please contact [facility] for night-coverage after hours (5p -7a ) and weekends on [email or web address]  Patient seen and examined with the above-signed Advanced  Practice Provider and/or Housestaff. I personally reviewed laboratory data, imaging studies and relevant notes. I independently examined the patient and formulated the important aspects of the plan. I have edited the note to reflect any of my changes or salient points. I have personally discussed the plan with the patient and/or family.  Milrinone stopped yesterday. Co-ox stable. Remains in NSR on IV amio. Less confused today. CVP 8-9  UA negative  General:  Elderly. No resp difficulty HEENT: normal Neck: supple. JVP 8-9 Carotids 2+ bilat; no bruits. No lymphadenopathy or thryomegaly appreciated. Cor: PMI nondisplaced. Regular rate & rhythm. No rubs, gallops or murmurs. Lungs: clear  Abdomen: soft, nontender, nondistended. No hepatosplenomegaly. No bruits or masses. Good bowel sounds. Extremities: no cyanosis, clubbing, rash, tr edema  R shin wound Neuro: alert & orientedx3, cranial nerves grossly intact. moves all 4 extremities w/o difficulty. Affect pleasant  Cardiac output stable off milrinone x 24 hours. Volume improved after 20 pound diuresis. Remains in NSR on IV amio.   Will switch amio to po. Start po torsemide.   I think she will be ready for SNF tomorrow. She is interested in [facility]. SW working on [employer].   Arvilla Meresaniel Corneisha Alvi, MD  4:28 PM

## 2022-06-10 NOTE — TOC Progression Note (Signed)
Transition of Care [facility]) - Progression Note    Patient Details  Name: Alexis Stephens MRN: [id number] Date of Birth: 05/27/1935  Transition of Care [facility]) CM/SW Contact  Leander Rams, LCSW Phone Number: 06/10/2022, 2:53 PM  Clinical Narrative:     RN Little Ishikawa notfied CSW that pt and family have requested [facility].   CSW completed fl2 and faxed out. TOC will continue to follow this admission.    Expected Discharge Plan: Skilled Nursing Facility Barriers to Discharge: Continued Medical Work up  Expected Discharge Plan and Services   Discharge Planning Services: CM Consult Post Acute Care Choice: Skilled Nursing Facility Living arrangements for the past 2 months: Single Family Home                                       Social Determinants of Health (SDOH) Interventions SDOH Screenings   Food Insecurity: No Food Insecurity (06/02/2022)  Housing: Low Risk  (06/02/2022)  Transportation Needs: No Transportation Needs (06/02/2022)  Utilities: Not At Risk (06/02/2022)  Tobacco Use: Medium Risk (06/07/2022)    Readmission Risk Interventions     No data to display         Oletta Lamas, MSW, LCSWA, LCASA Transitions of Care  Clinical Social Worker I

## 2022-06-10 NOTE — Progress Notes (Signed)
Occupational Therapy Treatment Patient Details Name: Alexis Stephens MRN: [id number] DOB: 03/23/1935 Today's Date: 06/10/2022   History of present illness 87 yo female presents to [facility] from [facility] on 4/3 for acute on chronic HF, aflutter with RVR. S/p TEE, cardiovesrsion on 4/5. PMH includes HFrEF, nonischemic cardiomypathy, CKD 3, GERD, history of lymphoma status postchemotherapy 5 years ago.   OT comments  Pt currently at min assist for simulated toilet transfers.  Limited session secondary to orthostatic hypotension.  BP sitting EOB at 91/57 decreasing to 87/53 in standing.  Pt with some report of lightheadedness.  Once transferred step pivot to the recliner, pt's BP was at 88/57 with feet elevated.  Feel patient will benefit from continued inpatient follow up therapy, <3 hours/day.  Will continue to follow while in acute care.    Recommendations for follow up therapy are one component of a multi-disciplinary discharge planning process, led by the attending physician.  Recommendations may be updated based on patient status, additional functional criteria and insurance authorization.    Assistance Recommended at Discharge Frequent or constant Supervision/Assistance  Patient can return home with the following  A little help with walking and/or transfers;Assistance with cooking/housework;Assist for transportation;Help with stairs or ramp for entrance;A little help with bathing/dressing/bathroom   Equipment Recommendations  Other (comment) (TBD next venue of care)       Precautions / Restrictions Precautions Precautions: Fall Precaution Comments: watch sats, HR and BP Restrictions Weight Bearing Restrictions: No       Mobility Bed Mobility Overal bed mobility: Needs Assistance Bed Mobility: Supine to Sit     Supine to sit: Min guard, HOB elevated     General bed mobility comments: Increased time with transfer to the EOB but no physical assist with HOB  elevated.    Transfers Overall transfer level: Needs assistance Equipment used: Rolling walker (2 wheels) Transfers: Sit to/from Stand, Bed to chair/wheelchair/BSC Sit to Stand: Min assist     Step pivot transfers: Min assist     General transfer comment: Min instructional cueing for hand placement.     Balance Overall balance assessment: Needs assistance Sitting-balance support: No upper extremity supported, Feet supported Sitting balance-Leahy Scale: Good Sitting balance - Comments: static sitting without LOB   Standing balance support: Bilateral upper extremity supported, During functional activity, Reliant on assistive device for balance Standing balance-Leahy Scale: Poor Standing balance comment: Pt needs UE support on the RW                           ADL either performed or assessed with clinical judgement   ADL Overall ADL's : Needs assistance/impaired     Grooming: Wash/dry face;Wash/dry hands;Sitting;Set up Grooming Details (indicate cue type and reason): seated EOB                 Toilet Transfer: Stand-pivot;Rolling walker (2 wheels);Ambulation;Minimal assistance Toilet Transfer Details (indicate cue type and reason): simulated, pt declined need to toilet Toileting- Clothing Manipulation and Hygiene: Minimal assistance       Functional mobility during ADLs: Minimal assistance;Rolling walker (2 wheels) (stand pivot to the bedside chair) General ADL Comments: Pt noted decreased BP in sitting and with standing.  In supine with HOB elevated it was 94/63.  Sitting EOB it decreased to 91/56 and in standing 87/53.  Did not attempt ambulation to the sink for grooming tasks secondary to lower BP.  Instead had her wash hands and face with a  washcloth sitting at the EOB and then complete stand pivot to the bedside chair.  BP in chair with feet elevated at 88/57.      Cognition Arousal/Alertness: Awake/alert Behavior During Therapy: WFL for tasks  assessed/performed Overall Cognitive Status: Within Functional Limits for tasks assessed                     Current Attention Level: Sustained   Following Commands: Follows multi-step commands with increased time, Follows one step commands consistently                      General Comments BP: 84/50 supine, BP: 84/65 seated, BP: 97/61 seated after standing marches, BP: 94/53 in recliner    Pertinent Vitals/ Pain       Pain Assessment Pain Assessment: Faces Faces Pain Scale: Hurts a little bit Pain Location: left lower leg Pain Descriptors / Indicators: Discomfort Pain Intervention(s): Repositioned         Frequency  Min 2X/week        Progress Toward Goals  OT Goals(current goals can now be found in the care plan section)  Progress towards OT goals: Progressing toward goals  Acute Rehab OT Goals Patient Stated Goal: Pt wants to get to feeling better OT Goal Formulation: With patient Time For Goal Achievement: 06/19/22 Potential to Achieve Goals: Good  Plan Discharge plan needs to be updated;Frequency remains appropriate       AM-PAC OT "6 Clicks" Daily Activity     Outcome Measure   Help from another person eating meals?: None Help from another person taking care of personal grooming?: A Little Help from another person toileting, which includes using toliet, bedpan, or urinal?: A Lot Help from another person bathing (including washing, rinsing, drying)?: A Lot Help from another person to put on and taking off regular upper body clothing?: A Little Help from another person to put on and taking off regular lower body clothing?: A Lot 6 Click Score: 16    End of Session Equipment Utilized During Treatment: Gait belt;Rolling walker (2 wheels)  OT Visit Diagnosis: Unsteadiness on feet (R26.81);Other abnormalities of gait and mobility (R26.89);Muscle weakness (generalized) (M62.81)   Activity Tolerance Other (comment) (pt limited secondary to  hypotension)   Patient Left in chair;with call bell/phone within reach   Nurse Communication Mobility status (hypotension)        Time: 7096-4383 OT Time Calculation (min): 35 min  Charges: OT General Charges $OT Visit: 1 Visit OT Treatments $Self Care/Home Management : 23-37 mins   Perrin Maltese, OTR/L Acute Rehabilitation Services  Office [phone] 06/10/2022

## 2022-06-10 NOTE — [place] FL2 (Signed)
[place] MEDICAID FL2 LEVEL OF CARE FORM     IDENTIFICATION  Patient Name: Alexis Stephens Birthdate: 18-Nov-1935 Sex: female Admission Date (Current Location): 06/02/2022  [facility] and [place] Number:  Producer, television/film/video and Address:  [facility], [address], [place], [place] [address]      Provider Number: [id number]  Attending Physician Name and Address:  Laurey Morale, MD  Relative Name and Phone Number:       Current Level of Care: Hospital Recommended Level of Care: Skilled Nursing Facility Prior Approval Number:    Date Approved/Denied:   PASRR Number: [id number]  Discharge Plan: SNF    Current Diagnoses: Patient Active Problem List   Diagnosis Date Noted   Acute on chronic systolic (congestive) heart failure 06/02/2022   Atrial flutter 05/25/2022   B12 deficiency 06/23/2021   AKI (acute kidney injury) 10/30/2020   Diffuse large B cell lymphoma 04/09/2020   Swelling    Hymenoptera allergy    GERD (gastroesophageal reflux disease)    Cataract    Cancer    Dyslipidemia 10/19/2019   Overgrown toenails 09/21/2019   HFrEF (heart failure with reduced ejection fraction) 11/25/2017   Nonischemic cardiomyopathy 11/25/2017   Postoperative examination 02/17/2016   BMI 35.0-35.9,adult 02/10/2016   Left hip pain 02/10/2016   Lymphadenopathy, inguinal 02/10/2016   Palpitations 10/30/2015   Tachycardia 10/30/2015   Anaphylaxis due to hymenoptera venom 10/30/2014    Orientation RESPIRATION BLADDER Height & Weight     Self, Time, Situation, Place  Normal Continent Weight: 155 lb 14.4 oz (70.7 kg) Height:  5\' 2"  (157.5 cm)  BEHAVIORAL SYMPTOMS/MOOD NEUROLOGICAL BOWEL NUTRITION STATUS      Continent Diet (See dc summary)  AMBULATORY STATUS COMMUNICATION OF NEEDS Skin   Limited Assist Verbally Normal                       Personal Care Assistance Level of Assistance  Bathing, Feeding, Dressing Bathing Assistance:  Limited assistance Feeding assistance: Independent Dressing Assistance: Limited assistance     Functional Limitations Info  Sight, Hearing, Speech Sight Info: Adequate Hearing Info: Impaired Speech Info: Adequate    SPECIAL CARE FACTORS FREQUENCY  PT (By licensed PT), OT (By licensed OT)     PT Frequency: 5xweek OT Frequency: 5xweek            Contractures Contractures Info: Not present    Additional Factors Info  Code Status, Allergies Code Status Info: DNR Allergies Info: Iodinated Contrast Media  Mixed Vespid Venom  Wasp Venom  Bee Venom  Furadantin (Nitrofurantoin)  Iodine  Iodine-131  Magnesium Citrate  Penicillamine  Penicillins  Sulfa Antibiotics  Sulfasalazine           Current Medications (06/10/2022):  This is the current hospital active medication list Current Facility-Administered Medications  Medication Dose Route Frequency Provider Last Rate Last Admin   0.9 %  sodium chloride infusion  250 mL Intravenous PRN Clegg, Amy D, NP       acetaminophen (TYLENOL) tablet 650 mg  650 mg Oral Q4H PRN Clegg, Amy D, NP   650 mg at 06/10/22 1014   amiodarone (PACERONE) tablet 200 mg  200 mg Oral BID Clegg, Amy D, NP   200 mg at 06/10/22 1014   apixaban (ELIQUIS) tablet 2.5 mg  2.5 mg Oral BID Brynda Peon L, NP   2.5 mg at 06/10/22 1014   Chlorhexidine Gluconate Cloth 2 % PADS  6 each  6 each Topical Daily Laurey Morale, MD   6 each at 06/10/22 1412   levothyroxine (SYNTHROID) tablet 25 mcg  25 mcg Oral Q0600 Brynda Peon L, NP   25 mcg at 06/10/22 3361   melatonin tablet 3 mg  3 mg Oral QHS Regan Rakers, MD   3 mg at 06/09/22 2157   ondansetron (ZOFRAN) injection 4 mg  4 mg Intravenous Q6H PRN Clegg, Amy D, NP       Oral care mouth rinse  15 mL Mouth Rinse PRN Laurey Morale, MD       sodium chloride flush (NS) 0.9 % injection 10-40 mL  10-40 mL Intracatheter Q12H Laurey Morale, MD   10 mL at 06/10/22 1030   sodium chloride flush (NS) 0.9 % injection  10-40 mL  10-40 mL Intracatheter PRN Laurey Morale, MD       sodium chloride flush (NS) 0.9 % injection 3 mL  3 mL Intravenous Q12H Clegg, Amy D, NP   3 mL at 06/10/22 1030   sodium chloride flush (NS) 0.9 % injection 3 mL  3 mL Intravenous PRN Clegg, Amy D, NP   3 mL at 06/07/22 2049   torsemide (DEMADEX) tablet 40 mg  40 mg Oral BID Alen Bleacher, NP         Discharge Medications: Please see discharge summary for a list of discharge medications.  Relevant Imaging Results:  Relevant Lab Results:   Additional Information SSN: [id number]  Oletta Lamas, MSW, [facility] Transitions of Care  Clinical Social Worker I

## 2022-06-10 NOTE — TOC Progression Note (Signed)
Transition of Care [facility]) - Progression Note    Patient Details  Name: Alexis Stephens MRN: [id number] Date of Birth: 06-03-1935  [facility] of [facility]) CM/SW Contact  Elliot Cousin, RN Phone Number: [phone] 06/10/2022, 11:02 AM  Clinical Narrative:      CM spoke to patient and states she wants to go to SNF rehab, [facility]. She lives alone, has a RW, Rollator, scale, cane, and bedside commode.    Expected Discharge Plan: Skilled Nursing Facility Barriers to Discharge: Continued Medical Work up  Expected Discharge Plan and Services   Discharge Planning Services: CM Consult Post Acute Care Choice: Skilled Nursing Facility Living arrangements for the past 2 months: Single Family Home                                       Social Determinants of Health (SDOH) Interventions SDOH Screenings   Food Insecurity: No Food Insecurity (06/02/2022)  Housing: Low Risk  (06/02/2022)  Transportation Needs: No Transportation Needs (06/02/2022)  Utilities: Not At Risk (06/02/2022)  Tobacco Use: Medium Risk (06/07/2022)    Readmission Risk Interventions     No data to display

## 2022-06-11 ENCOUNTER — Other Ambulatory Visit (HOSPITAL_COMMUNITY): Payer: Self-pay

## 2022-06-11 DIAGNOSIS — N183 Chronic kidney disease, stage 3 unspecified: Secondary | ICD-10-CM | POA: Diagnosis not present

## 2022-06-11 DIAGNOSIS — I213 ST elevation (STEMI) myocardial infarction of unspecified site: Secondary | ICD-10-CM | POA: Diagnosis not present

## 2022-06-11 DIAGNOSIS — Z8572 Personal history of non-Hodgkin lymphomas: Secondary | ICD-10-CM | POA: Diagnosis not present

## 2022-06-11 DIAGNOSIS — R0682 Tachypnea, not elsewhere classified: Secondary | ICD-10-CM | POA: Diagnosis not present

## 2022-06-11 DIAGNOSIS — Z5181 Encounter for therapeutic drug level monitoring: Secondary | ICD-10-CM | POA: Diagnosis not present

## 2022-06-11 DIAGNOSIS — Z7409 Other reduced mobility: Secondary | ICD-10-CM | POA: Diagnosis not present

## 2022-06-11 DIAGNOSIS — Z7401 Bed confinement status: Secondary | ICD-10-CM | POA: Diagnosis not present

## 2022-06-11 DIAGNOSIS — I499 Cardiac arrhythmia, unspecified: Secondary | ICD-10-CM | POA: Diagnosis not present

## 2022-06-11 DIAGNOSIS — M6281 Muscle weakness (generalized): Secondary | ICD-10-CM | POA: Diagnosis not present

## 2022-06-11 DIAGNOSIS — R001 Bradycardia, unspecified: Secondary | ICD-10-CM | POA: Diagnosis not present

## 2022-06-11 DIAGNOSIS — R0902 Hypoxemia: Secondary | ICD-10-CM | POA: Diagnosis not present

## 2022-06-11 DIAGNOSIS — R2681 Unsteadiness on feet: Secondary | ICD-10-CM | POA: Diagnosis not present

## 2022-06-11 DIAGNOSIS — Z7901 Long term (current) use of anticoagulants: Secondary | ICD-10-CM | POA: Diagnosis not present

## 2022-06-11 DIAGNOSIS — I5023 Acute on chronic systolic (congestive) heart failure: Secondary | ICD-10-CM | POA: Diagnosis not present

## 2022-06-11 DIAGNOSIS — K449 Diaphragmatic hernia without obstruction or gangrene: Secondary | ICD-10-CM | POA: Diagnosis not present

## 2022-06-11 DIAGNOSIS — R112 Nausea with vomiting, unspecified: Secondary | ICD-10-CM | POA: Diagnosis not present

## 2022-06-11 DIAGNOSIS — R110 Nausea: Secondary | ICD-10-CM | POA: Diagnosis not present

## 2022-06-11 DIAGNOSIS — N179 Acute kidney failure, unspecified: Secondary | ICD-10-CM | POA: Diagnosis not present

## 2022-06-11 DIAGNOSIS — R0789 Other chest pain: Secondary | ICD-10-CM | POA: Diagnosis not present

## 2022-06-11 DIAGNOSIS — Z741 Need for assistance with personal care: Secondary | ICD-10-CM | POA: Diagnosis not present

## 2022-06-11 DIAGNOSIS — I509 Heart failure, unspecified: Secondary | ICD-10-CM | POA: Diagnosis not present

## 2022-06-11 DIAGNOSIS — R609 Edema, unspecified: Secondary | ICD-10-CM | POA: Diagnosis not present

## 2022-06-11 DIAGNOSIS — M6289 Other specified disorders of muscle: Secondary | ICD-10-CM | POA: Diagnosis not present

## 2022-06-11 DIAGNOSIS — I480 Paroxysmal atrial fibrillation: Secondary | ICD-10-CM | POA: Diagnosis not present

## 2022-06-11 DIAGNOSIS — N1832 Chronic kidney disease, stage 3b: Secondary | ICD-10-CM | POA: Diagnosis not present

## 2022-06-11 DIAGNOSIS — K219 Gastro-esophageal reflux disease without esophagitis: Secondary | ICD-10-CM | POA: Diagnosis not present

## 2022-06-11 DIAGNOSIS — E785 Hyperlipidemia, unspecified: Secondary | ICD-10-CM | POA: Diagnosis not present

## 2022-06-11 DIAGNOSIS — E878 Other disorders of electrolyte and fluid balance, not elsewhere classified: Secondary | ICD-10-CM | POA: Diagnosis not present

## 2022-06-11 DIAGNOSIS — I5020 Unspecified systolic (congestive) heart failure: Secondary | ICD-10-CM | POA: Diagnosis not present

## 2022-06-11 DIAGNOSIS — R54 Age-related physical debility: Secondary | ICD-10-CM | POA: Diagnosis not present

## 2022-06-11 DIAGNOSIS — I483 Typical atrial flutter: Secondary | ICD-10-CM | POA: Diagnosis not present

## 2022-06-11 DIAGNOSIS — R531 Weakness: Secondary | ICD-10-CM | POA: Diagnosis not present

## 2022-06-11 DIAGNOSIS — R231 Pallor: Secondary | ICD-10-CM | POA: Diagnosis not present

## 2022-06-11 DIAGNOSIS — E039 Hypothyroidism, unspecified: Secondary | ICD-10-CM | POA: Diagnosis not present

## 2022-06-11 LAB — MAGNESIUM: Magnesium: 2.2 mg/dL (ref 1.7–2.4)

## 2022-06-11 LAB — BASIC METABOLIC PANEL
Anion gap: 10 (ref 5–15)
BUN: 38 mg/dL — ABNORMAL HIGH (ref 8–23)
CO2: 31 mmol/L (ref 22–32)
Calcium: 8 mg/dL — ABNORMAL LOW (ref 8.9–10.3)
Chloride: 95 mmol/L — ABNORMAL LOW (ref 98–111)
Creatinine, Ser: 1.77 mg/dL — ABNORMAL HIGH (ref 0.44–1.00)
GFR, Estimated: 28 mL/min — ABNORMAL LOW (ref >60)
Glucose, Bld: 76 mg/dL (ref 70–99)
Potassium: 3.1 mmol/L — ABNORMAL LOW (ref 3.5–5.1)
Sodium: 136 mmol/L (ref 135–145)

## 2022-06-11 LAB — CBC
HCT: 46.4 % — ABNORMAL HIGH (ref 36.0–46.0)
Hemoglobin: 15.1 g/dL — ABNORMAL HIGH (ref 12.0–15.0)
MCH: 33.6 pg (ref 26.0–34.0)
MCHC: 32.5 g/dL (ref 30.0–36.0)
MCV: 103.3 fL — ABNORMAL HIGH (ref 80.0–100.0)
Platelets: 221 10*3/uL (ref 150–400)
RBC: 4.49 MIL/uL (ref 3.87–5.11)
RDW: 13.9 % (ref 11.5–15.5)
WBC: 5 10*3/uL (ref 4.0–10.5)
nRBC: 0 % (ref 0.0–0.2)

## 2022-06-11 LAB — COOXEMETRY PANEL
Carboxyhemoglobin: 1 % (ref 0.5–1.5)
Methemoglobin: <0.7 % (ref 0.0–1.5)
O2 Saturation: 63.6 %
Total hemoglobin: 15.5 g/dL (ref 12.0–16.0)

## 2022-06-11 LAB — GLUCOSE, CAPILLARY
Glucose-Capillary: 100 mg/dL — ABNORMAL HIGH (ref 70–99)
Glucose-Capillary: 109 mg/dL — ABNORMAL HIGH (ref 70–99)

## 2022-06-11 MED ORDER — AMIODARONE HCL 200 MG PO TABS: 200.0000 mg | ORAL_TABLET | Freq: Two times a day (BID) | ORAL | 3 refills | Status: DC

## 2022-06-11 MED ORDER — TORSEMIDE 40 MG PO TABS: 40.0000 mg | ORAL_TABLET | Freq: Two times a day (BID) | ORAL | 5 refills | Status: DC

## 2022-06-11 MED ORDER — CYCLOBENZAPRINE HCL 5 MG PO TABS
2.5000 mg | ORAL_TABLET | Freq: Once | ORAL | Status: AC
  Administered 2022-06-11: 2.5 mg via ORAL
  Filled 2022-06-11: qty 1

## 2022-06-11 MED ORDER — POTASSIUM CHLORIDE CRYS ER 20 MEQ PO TBCR: 40.0000 meq | EXTENDED_RELEASE_TABLET | Freq: Two times a day (BID) | ORAL | Status: DC

## 2022-06-11 MED ORDER — POTASSIUM CHLORIDE CRYS ER 20 MEQ PO TBCR
40.0000 meq | EXTENDED_RELEASE_TABLET | ORAL | Status: AC
  Administered 2022-06-11 (×2): 40 meq via ORAL
  Filled 2022-06-11: qty 2

## 2022-06-11 MED ORDER — HEPARIN SOD (PORK) LOCK FLUSH 100 UNIT/ML IV SOLN
500.0000 [IU] | INTRAVENOUS | Status: AC | PRN
  Administered 2022-06-11: 500 [IU]

## 2022-06-11 MED ORDER — POTASSIUM CHLORIDE CRYS ER 20 MEQ PO TBCR: 40.0000 meq | EXTENDED_RELEASE_TABLET | Freq: Two times a day (BID) | ORAL | 3 refills | Status: DC

## 2022-06-11 NOTE — Discharge Instructions (Signed)

## 2022-06-11 NOTE — TOC Transition Note (Signed)
Transition of Care [facility]) - CM/SW Discharge Note   Patient Details  Name: Alexis Stephens MRN: [id number] Date of Birth: 1936-02-11  Transition of Care [facility]) CM/SW Contact:  Leander Rams, LCSW Phone Number: 06/11/2022, 12:19 PM   Clinical Narrative:    Patient will DC to: [facility]  Anticipated DC date: 06/11/2022 Family notified: Myrene Buddy Transport by: Sharin Mons   Per MD patient ready for DC to [facility]. RN, patient, patient's family, and facility notified of DC. Discharge Summary and FL2 sent to facility. RN to call report prior to discharge [phone]. DC packet on chart. Ambulance transport requested for patient.   CSW will sign off for now as social work intervention is no longer needed. Please consult [place] again if new needs arise.    Final next level of care: Skilled Nursing Facility Barriers to Discharge: No Barriers Identified   Patient Goals and CMS Choice CMS Medicare.gov Compare Post Acute Care list provided to:: Patient Choice offered to / list presented to : Patient  Discharge Placement                Patient chooses bed at:  [facility]) Patient to be transferred to facility by: [facility] Name of family member notified: Myrene Buddy Patient and family notified of of transfer: 06/11/22  Discharge Plan and Services Additional resources added to the After Visit Summary for     Discharge Planning Services: CM Consult Post Acute Care Choice: Skilled Nursing Facility                               Social Determinants of Health (SDOH) Interventions SDOH Screenings   Food Insecurity: No Food Insecurity (06/02/2022)  Housing: Low Risk  (06/02/2022)  Transportation Needs: No Transportation Needs (06/02/2022)  Utilities: Not At Risk (06/02/2022)  Tobacco Use: Medium Risk (06/07/2022)     Readmission Risk Interventions     No data to display          Oletta Lamas, MSW,  LCSWA, LCASA Transitions of Care  Clinical Social Worker I

## 2022-06-11 NOTE — Progress Notes (Addendum)
Patient ID: Wylene Simmer, female   DOB: 12/02/1935, 87 y.o.   MRN: [id number]     Advanced Heart Failure Rounding Note  PCP-Cardiologist: Gypsy Balsam, MD   Subjective:   Remains in NSR, s/p DCCV 4/5  Milrinone and amio gtt stopped 4/10. Now on PO amio.  CO-OX 64%.   PO Torsemide started yesterday. Overall weight down 19 pounds. Creatinine 2.2>1.86> 1.79> pending. -3L UOP  Sodium 124 4/10 s/p tolvaptan, >132. Pending today  Feels ok this morning, upset about needing help and missing her call bell (given to her as it had fallen). Denies CP/SOB  Objective:   Weight Range: 70.7 kg Body mass index is 28.51 kg/m.   Vital Signs:   Temp:  [97.5 F (36.4 C)-98 F (36.7 C)] 97.9 F (36.6 C) (04/12 0800) Pulse Rate:  [86-95] 95 (04/12 0800) Resp:  [18-20] 20 (04/12 0745) BP: (90-98)/(55-64) 90/56 (04/12 0800) SpO2:  [93 %-100 %] 93 % (04/12 0800) Weight:  [70.7 kg] 70.7 kg (04/12 0425) Last BM Date : 06/02/22  Weight change: Filed Weights   06/10/22 0347 06/10/22 0400 06/11/22 0425  Weight: 70.7 kg 70.7 kg 70.7 kg   Intake/Output:   Intake/Output Summary (Last 24 hours) at 06/11/2022 0851 Last data filed at 06/11/2022 0600 Gross per 24 hour  Intake 720 ml  Output 3000 ml  Net -2280 ml  CVP 7 Physical Exam  General:  well appearing.  No respiratory difficulty HEENT: normal Neck: supple. JVD ~7 cm. Carotids 2+ bilat; no bruits. No lymphadenopathy or thyromegaly appreciated. Cor: PMI nondisplaced. Regular rate & rhythm. No rubs, gallops or murmurs. R chest port Lungs: clear Abdomen: soft, nontender, nondistended. No hepatosplenomegaly. No bruits or masses. Good bowel sounds. Extremities: no cyanosis, clubbing, rash, edema. BLE tender to touch Neuro: alert & oriented x 3, cranial nerves grossly intact. moves all 4 extremities w/o difficulty. Affect pleasant.   Telemetry   NSR 90s (Personally reviewed)    EKG    No new EKG to review  Labs     CBC Recent Labs    06/10/22 0523 06/11/22 0459  WBC 5.4 5.0  HGB 14.6 15.1*  HCT 43.7 46.4*  MCV 100.9* 103.3*  PLT 201 221    Basic Metabolic Panel Recent Labs    40/98/11 1200 06/09/22 1830 06/10/22 0523 06/11/22 0459  NA 132* 131* 132*  --   K 4.2  --  3.6  --   CL 87*  --  90*  --   CO2 30  --  30  --   GLUCOSE 97  --  84  --   BUN 42*  --  41*  --   CREATININE 1.97*  --  1.79*  --   CALCIUM 9.0  --  8.4*  --   MG  --   --  2.6* 2.2   Liver Function Tests No results for input(s): "AST", "ALT", "ALKPHOS", "BILITOT", "PROT", "ALBUMIN" in the last 72 hours.  No results for input(s): "LIPASE", "AMYLASE" in the last 72 hours. Cardiac Enzymes No results for input(s): "CKTOTAL", "CKMB", "CKMBINDEX", "TROPONINI" in the last 72 hours.  BNP: BNP (last 3 results) Recent Labs    06/02/22 1602  BNP >4,500.0*   ProBNP (last 3 results) Recent Labs    10/30/21 1359 01/29/22 1406 05/25/22 1454  PROBNP 22,916* 47,149* 38,570*   D-Dimer No results for input(s): "DDIMER" in the last 72 hours. Hemoglobin A1C Recent Labs    06/09/22 0530  HGBA1C 5.8*  Fasting Lipid Panel No results for input(s): "CHOL", "HDL", "LDLCALC", "TRIG", "CHOLHDL", "LDLDIRECT" in the last 72 hours. Thyroid Function Tests No results for input(s): "TSH", "T4TOTAL", "T3FREE", "THYROIDAB" in the last 72 hours.  Invalid input(s): "FREET3"  Other results:  Imaging    No results found.   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 Transferred from [place] for A/C HFrEF + A Flutter.    Assessment/Plan  1. Acute on chronic systolic CHF: NICM, exacerbation likely triggered by the onset of atrial fibrillation/flutter at least a week ago.  Echo at [facility] with EF 20%, had cardiac MRI in 2022 with no LGE, suspected nonischemic cardiomyopathy. TEE 4/5 with LV EF 20-25% with moderate RV dysfunction, moderate MR.  Milrinone started with low co-ox, decreased to 0.125 at time of DCCV.  Co-ox 63% . Milrinone off 4/10.  Creatinine 2.2=>1.86. CVP 7. Overall diuresed 19 pounds.  - Was on lasix 160mg  am / 80 mg pm (per patient report she took PRN). Continue 40 Torsemide BID  With elevated creatinine and soft BP (SBP 90s), no room to add GDMT currently.  2. Atypical atrial flutter/fibrillation: Now back in NSR s/p DCCV on 4/5.  Frequent PACs and episodes of Wenckebach periodicity.  - Maintaining SR. Continue amio 200 mg twice a day. - Continue eliquis 2.5 mg twice a day  - TSH elevated, continue synthroid - Hopefully rhythm will be more stable when we stop milrinone completely.  If AF recurs, would repeat DCCV off milrinone.  3. AKI on CKD Stage 3:  3 months ago creatinine 1.4>>>2.2 at [facility]. Creatinine 2>2.2>1.86>1.79> pending today.   - avoid hypotension - Daily BMET  - Limits GDMT.  4. H/O Lymphoma: Completed chemotherapy 5 years ago.  5. GOC: DNR 6. Lethargy - resolved, suspect it was 2/2 new flexeril and melatonin.  - No further flexeril 7. Lactic acidosis - Resolved 1.8  8. Hyponatremia - down to 124 4/10. S/p tolvaptan 4/10. Na up to 132. Pending today.  - restrict free water  Pt medically ready for discharge once bed at SNF becomes available.   Length of Stay: 9  Alen Bleacher, NP  06/11/2022, 8:51 AM  Advanced  Heart Failure Team Pager [phone] (M-F; 7a - 5p)  Please contact [facility] for night-coverage after hours (5p -7a ) and weekends on [email or web address]  Patient seen and examined with the above-signed Advanced Practice Provider and/or Housestaff. I personally reviewed laboratory data, imaging studies and relevant notes. I independently examined the patient and formulated the important aspects of the plan. I have edited the note to reflect any of my changes or salient points. I have personally discussed the plan with the patient and/or family.  Stable off milrinone. Responding well to  po torsemide.   No CP or SOB. Remains in NSR.   General: Sitting in chair . No resp difficulty HEENT: normal Neck: supple. JVP 7-8  Carotids 2+ bilat; no bruits. No lymphadenopathy or thryomegaly appreciated. Cor: PMI nondisplaced. Regular rate & rhythm. No rubs, gallops or murmurs. Lungs: clear Abdomen: soft, nontender, nondistended. No hepatosplenomegaly. No bruits or masses. Good bowel sounds. Extremities: no cyanosis, clubbing, rash, tr edema Neuro: alert & orientedx3, cranial nerves grossly intact. moves all 4 extremities w/o difficulty. Affect pleasant  Stable from HF perspective. Can continue current meds. Stable for d/c to SNF once K supped. GDMT limited by low BP. Will arrange f/u in HF Clinic    D/c on   Amio 200 bid Torsemide 40 bid Kdu 40 bid  Eliquis 2.5 bid  Arvilla Meres, MD  12:43 PM

## 2022-06-11 NOTE — Discharge Summary (Cosign Needed Addendum)
Advanced Heart Failure Team  Discharge Summary   Patient ID: Alexis Stephens MRN: [id number], DOB/AGE: [birth year] 87 y.o. 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    Had been following with Cardiology and recently had increased diuretics for increase BLE edema and SOB but ultimately patient very noncompliant with meds and self dosing, typically taking meds once a week. EKG there showed atrial flutter with RVR. Presented to [facility] with A/C HFrEF and atrial flutter. Diuresis started and transferred to [facility].   Once at [facility], she was started on milrinone, amiodarone and lasix gtt. Diuresed well, overall down ~19lbs, lasix gtt and milrinone stopped. 4/5 underwent successful TEE/DCCV to NSR. Amiodarone gtt stopped and transitioned to PO. Started on PO diuretics. Briefly did develop hyponatremia down to 124, quickly corrected with a dose of tolvaptan. Has intermittent confusion so safest plan for discharge was SNF. Would probably continue to be noncompliant at home with meds.   Pt will continue to be followed closely in the HF clinic. Dr Gala Romney evaluated and deemed appropriate for discharge.    See below for detailed problem list:  1. Acute on chronic systolic CHF: NICM, exacerbation likely triggered by the onset of atrial fibrillation/flutter at least a week ago.  Echo at [facility] with EF 20%, had cardiac MRI in 2022 with no LGE, suspected nonischemic cardiomyopathy. TEE 4/5 with LV EF 20-25% with moderate RV dysfunction, moderate MR.  Milrinone started with low co-ox, decreased to  0.125 at time of DCCV.  Co-ox 63% . Milrinone off 4/10.  Creatinine 2.2=>1.86. CVP 7. Overall diuresed 19 pounds.  - Continue 40 Torsemide BID w/ 40 KDUR BID - With elevated creatinine and soft BP (SBP 90s), no room to add GDMT currently.  2. Atypical atrial flutter/fibrillation: Now back in NSR s/p DCCV on 4/5.  Frequent PACs and episodes of Wenckebach periodicity.  - Maintaining SR. Continue amio 200 mg twice a day. - Continue eliquis 2.5 mg twice a day  - TSH elevated, continue synthroid  3. AKI on CKD Stage 3:  3 months ago creatinine 1.4>>>2.2 at [facility]. Creatinine 2>2.2>1.86>1.79> 1.77.   - Limits GDMT.  4. H/O Lymphoma: Completed chemotherapy 5 years ago.  5. GOC: DNR 6. Lethargy - resolved, suspect it was 2/2 new flexeril and melatonin.  - No further flexeril 7. Lactic acidosis - Resolved 1.8  8. Hyponatremia - down to 124 4/10. S/p tolvaptan 4/10. Na back WNL. 136 today.  - restrict free water  Discharge Weight Range: 70.7kg Discharge Vitals: Blood pressure 92/65, pulse 91, temperature 97.9 F (36.6 C), temperature source Oral, resp. rate 18, height  (1.575 m), weight 70.7 kg, SpO2 96 %.  Labs: Lab Results  Component Value Date   WBC 5.0 06/11/2022   HGB 15.1 (H) 06/11/2022   HCT 46.4 (H) 06/11/2022   MCV 103.3 (H) 06/11/2022   PLT 221 06/11/2022    Recent Labs  Lab 06/11/22 0459  NA 136  K 3.1*  CL 95*  CO2 31  BUN 38*  CREATININE 1.77*  CALCIUM 8.0*  GLUCOSE 76  Lab Results  Component Value Date   CHOL 199 11/10/2020   HDL 41 11/10/2020   LDLCALC 119 (H) 11/10/2020   TRIG 223 (H) 11/10/2020   BNP (last 3 results) Recent Labs    06/02/22 1602  BNP >4,500.0*    ProBNP (last 3 results) Recent Labs    10/30/21 1359 01/29/22 1406 05/25/22 1454  PROBNP 22,916* 47,149* 38,570*     Diagnostic Studies/Procedures   TEE/DCCV 4/5 -> NSR  Discharge Medications   Allergies as of 06/11/2022       Reactions   Iodinated Contrast  Media Other (See Comments)   CARDIAC ARREST Other Reaction(s): Other (See Comments) Reaction: Cardiovascular Arrest (ALLERGY/intolerance)   Mixed Vespid Venom Anaphylaxis   Wasp Venom Anaphylaxis   Bee Venom Other (See Comments)   Mixed vespids   Furadantin [nitrofurantoin] Hives, Nausea Only   Iodine    Iodine-131 Other (See Comments)   Unknown   Magnesium Citrate Hives   Penicillamine Other (See Comments)   Unknown   Penicillins    unknown   Sulfa Antibiotics    unknown   Sulfasalazine Other (See Comments)        Medication List     STOP taking these medications    cyclobenzaprine 10 MG tablet Commonly known as: FLEXERIL   furosemide 80 MG tablet Commonly known as: LASIX   losartan 50 MG tablet Commonly known as: COZAAR   metolazone 2.5 MG tablet Commonly known as: ZAROXOLYN   metoprolol tartrate 25 MG tablet Commonly known as: LOPRESSOR       TAKE these medications    acetaminophen 325 MG tablet Commonly known as: TYLENOL Take 650 mg by mouth every 6 (six) hours as needed for mild pain, fever or headache.   amiodarone 200 MG tablet Commonly known as: PACERONE Take 1 tablet (200 mg total) by mouth 2 (two) times daily.   apixaban 2.5 MG Tabs tablet Commonly known as: ELIQUIS Take 1 tablet (2.5 mg total) by mouth 2 (two) times daily.   EPINEPHrine 0.3 mg/0.3 mL Soaj injection Commonly known as: EPI-PEN Inject 0.3 mg into the muscle as needed for anaphylaxis.   levothyroxine 25 MCG tablet Commonly known as: SYNTHROID Take 1 tablet (25 mcg total) by mouth daily at 6 (six) AM.   multivitamin capsule Take 1 capsule by mouth daily. Unknown strength   potassium chloride SA 20 MEQ tablet Commonly known as: KLOR-CON M Take 2 tablets (40 mEq total) by mouth 2 (two) times daily. Start taking on: June 12, 2022   Torsemide 40 MG Tabs Take 40 mg by mouth 2 (two) times daily.        Disposition   The patient will be discharged in stable  condition to home. Discharge Instructions     (HEART FAILURE PATIENTS) Call MD:  Anytime you have any of the following symptoms: 1) 3 pound weight gain in 24 hours or 5 pounds in 1 week 2) shortness of breath, with or without a dry hacking cough 3) swelling in the hands, feet or stomach 4) if you have to sleep on extra pillows at night in order to breathe.   Complete by: As directed    Diet - low sodium heart healthy   Complete by: As directed    Increase activity slowly   Complete by: As directed    No wound care   Complete by: As directed        Follow-up Information     [facility] Follow up on 06/21/2022.   Specialty: Cardiology Why: Follow up in the Advanced Heart Failure Clinic 4/22 at 1:30 pm Entrance C, free valet Contact information: [address] [id number] [place] [address] [phone]                  Duration of Discharge Encounter: Greater than 35 minutes   Signed, Brynda Peon, AGACNP-BC  06/11/2022, 1:43 PM

## 2022-06-12 ENCOUNTER — Encounter (HOSPITAL_COMMUNITY): Payer: Self-pay

## 2022-06-12 ENCOUNTER — Other Ambulatory Visit: Payer: Self-pay

## 2022-06-12 ENCOUNTER — Emergency Department (HOSPITAL_COMMUNITY): Payer: Medicare Other

## 2022-06-12 ENCOUNTER — Emergency Department (HOSPITAL_COMMUNITY)
Admission: EM | Admit: 2022-06-12 | Discharge: 2022-06-12 | Disposition: A | Discharge status: Home / Self Care | Payer: Medicare Other | Attending: Emergency Medicine | Admitting: Emergency Medicine

## 2022-06-12 DIAGNOSIS — I5020 Unspecified systolic (congestive) heart failure: Secondary | ICD-10-CM | POA: Diagnosis not present

## 2022-06-12 DIAGNOSIS — R0682 Tachypnea, not elsewhere classified: Secondary | ICD-10-CM | POA: Insufficient documentation

## 2022-06-12 DIAGNOSIS — R112 Nausea with vomiting, unspecified: Secondary | ICD-10-CM | POA: Diagnosis not present

## 2022-06-12 DIAGNOSIS — K449 Diaphragmatic hernia without obstruction or gangrene: Secondary | ICD-10-CM | POA: Diagnosis not present

## 2022-06-12 DIAGNOSIS — R231 Pallor: Secondary | ICD-10-CM | POA: Diagnosis not present

## 2022-06-12 DIAGNOSIS — Z8572 Personal history of non-Hodgkin lymphomas: Secondary | ICD-10-CM | POA: Diagnosis not present

## 2022-06-12 DIAGNOSIS — R0789 Other chest pain: Secondary | ICD-10-CM | POA: Diagnosis not present

## 2022-06-12 LAB — URINALYSIS, ROUTINE W REFLEX MICROSCOPIC
Bacteria, UA: NONE SEEN
Bilirubin Urine: NEGATIVE
Glucose, UA: NEGATIVE mg/dL
Hgb urine dipstick: NEGATIVE
Ketones, ur: NEGATIVE mg/dL
Nitrite: NEGATIVE
Protein, ur: NEGATIVE mg/dL
Specific Gravity, Urine: 1.009 (ref 1.005–1.030)
pH: 7 (ref 5.0–8.0)

## 2022-06-12 LAB — LIPASE, BLOOD: Lipase: 39 U/L (ref 11–51)

## 2022-06-12 LAB — CBC WITH DIFFERENTIAL/PLATELET
Abs Immature Granulocytes: 0.02 10*3/uL (ref 0.00–0.07)
Basophils Absolute: 0.1 10*3/uL (ref 0.0–0.1)
Basophils Relative: 1 %
Eosinophils Absolute: 0.1 10*3/uL (ref 0.0–0.5)
Eosinophils Relative: 1 %
HCT: 49.7 % — ABNORMAL HIGH (ref 36.0–46.0)
Hemoglobin: 16 g/dL — ABNORMAL HIGH (ref 12.0–15.0)
Immature Granulocytes: 0 %
Lymphocytes Relative: 12 %
Lymphs Abs: 0.8 10*3/uL (ref 0.7–4.0)
MCH: 33.5 pg (ref 26.0–34.0)
MCHC: 32.2 g/dL (ref 30.0–36.0)
MCV: 104 fL — ABNORMAL HIGH (ref 80.0–100.0)
Monocytes Absolute: 0.6 10*3/uL (ref 0.1–1.0)
Monocytes Relative: 9 %
Neutro Abs: 5 10*3/uL (ref 1.7–7.7)
Neutrophils Relative %: 77 %
Platelets: 262 10*3/uL (ref 150–400)
RBC: 4.78 MIL/uL (ref 3.87–5.11)
RDW: 13.7 % (ref 11.5–15.5)
WBC: 6.5 10*3/uL (ref 4.0–10.5)
nRBC: 0 % (ref 0.0–0.2)

## 2022-06-12 LAB — COMPREHENSIVE METABOLIC PANEL
ALT: <5 U/L (ref 0–44)
AST: 28 U/L (ref 15–41)
Albumin: 3.2 g/dL — ABNORMAL LOW (ref 3.5–5.0)
Alkaline Phosphatase: 63 U/L (ref 38–126)
Anion gap: 13 (ref 5–15)
BUN: 37 mg/dL — ABNORMAL HIGH (ref 8–23)
CO2: 25 mmol/L (ref 22–32)
Calcium: 8.4 mg/dL — ABNORMAL LOW (ref 8.9–10.3)
Chloride: 95 mmol/L — ABNORMAL LOW (ref 98–111)
Creatinine, Ser: 1.83 mg/dL — ABNORMAL HIGH (ref 0.44–1.00)
GFR, Estimated: 27 mL/min — ABNORMAL LOW (ref >60)
Glucose, Bld: 122 mg/dL — ABNORMAL HIGH (ref 70–99)
Potassium: 4.2 mmol/L (ref 3.5–5.1)
Sodium: 133 mmol/L — ABNORMAL LOW (ref 135–145)
Total Bilirubin: 1.4 mg/dL — ABNORMAL HIGH (ref 0.3–1.2)
Total Protein: 7.2 g/dL (ref 6.5–8.1)

## 2022-06-12 LAB — TROPONIN I (HIGH SENSITIVITY)
Troponin I (High Sensitivity): 31 ng/L — ABNORMAL HIGH (ref <18)
Troponin I (High Sensitivity): 35 ng/L — ABNORMAL HIGH (ref <18)

## 2022-06-12 MED ORDER — ACETAMINOPHEN 325 MG PO TABS
650.0000 mg | ORAL_TABLET | Freq: Once | ORAL | Status: AC
  Administered 2022-06-12: 650 mg via ORAL
  Filled 2022-06-12: qty 2

## 2022-06-12 NOTE — ED Provider Notes (Signed)
[place] EMERGENCY DEPARTMENT AT [facility] Provider Note   CSN: [id number] Arrival date & time: 06/12/22  1650     History  Chief Complaint  Patient presents with   Nausea    Alexis Stephens is a 87 y.o. female with past medical history as outlined below presents to the ED via EMS from [facility] complaining of nausea, vomiting, and a "sinking" feeling in her chest.  Patient states she was feeling fine until around 1500, which symptoms hit her suddenly.  Patient discharged yesterday from hospital for acute on chronic heart failure.  Patient states her nausea at this time is intermittent and will dissipate on its own.  Patient received Zofran at facility and IV Zofran via EMS prior to arrival.  Denies current chest pain, shortness of breath, fever, syncope, dizziness, headache, lightheadedness, weakness, diarrhea, abdominal pain.  Past Medical History:  Diagnosis Date   AKI (acute kidney injury) 10/30/2020   Anaphylaxis due to hymenoptera venom 10/30/2014   B12 deficiency 06/23/2021   BMI 35.0-35.9,adult 02/10/2016   Cancer    Stage 4 Non-Hodgkin Lymphoma, clear for the past year   Cataract    Diffuse large B cell lymphoma 04/09/2020   Dyslipidemia 10/19/2019   GERD (gastroesophageal reflux disease)    HFrEF (heart failure with reduced ejection fraction) 11/25/2017   Hymenoptera allergy    Left hip pain 02/10/2016   Lymphadenopathy, inguinal 02/10/2016   Nonischemic cardiomyopathy 11/25/2017   Overgrown toenails 09/21/2019   Palpitations 10/30/2015   Postoperative examination 02/17/2016   Swelling    Tachycardia 10/30/2015       Home Medications Prior to Admission medications   Medication Sig Start Date End Date Taking? Authorizing Provider  acetaminophen (TYLENOL) 325 MG tablet Take 650 mg by mouth every 6 (six) hours as needed for mild pain, fever or headache.    [provider]  amiodarone (PACERONE) 200 MG tablet  Take 1 tablet (200 mg total) by mouth 2 (two) times daily. 06/11/22   Alen Bleacher, NP  apixaban (ELIQUIS) 2.5 MG TABS tablet Take 1 tablet (2.5 mg total) by mouth 2 (two) times daily. 06/04/22   Laurey Morale, MD  EPINEPHrine 0.3 mg/0.3 mL IJ SOAJ injection Inject 0.3 mg into the muscle as needed for anaphylaxis.    [provider]  levothyroxine (SYNTHROID) 25 MCG tablet Take 1 tablet (25 mcg total) by mouth daily at 6 (six) AM. 06/05/22   Laurey Morale, MD  Multiple Vitamin (MULTIVITAMIN) capsule Take 1 capsule by mouth daily. Unknown strength    [provider]  potassium chloride SA (KLOR-CON M) 20 MEQ tablet Take 2 tablets (40 mEq total) by mouth 2 (two) times daily. 06/12/22   Alen Bleacher, NP  torsemide 40 MG TABS Take 40 mg by mouth 2 (two) times daily. 06/11/22   Alen Bleacher, NP      Allergies    Iodinated contrast media, Mixed vespid venom, Wasp venom, Bee venom, Furadantin [nitrofurantoin], Iodine, Iodine-131, Magnesium citrate, Penicillamine, Penicillins, Sulfa antibiotics, and Sulfasalazine    Review of Systems   Review of Systems  Constitutional:  Negative for fever.  Respiratory:  Negative for shortness of breath.   Cardiovascular:  Negative for chest pain.  Gastrointestinal:  Positive for nausea and vomiting. Negative for abdominal pain and diarrhea.  Neurological:  Negative for dizziness, syncope, weakness, light-headedness and headaches.    Physical Exam Updated Vital Signs BP (!) 95/58   Pulse 93  Temp 98.1 F (36.7 C) (Axillary)   Resp 17   Ht 5\' 2"  (1.575 m)   Wt 70.7 kg   SpO2 95%   BMI 28.51 kg/m  Physical Exam Vitals and nursing note reviewed.  Constitutional:      General: She is not in acute distress.    Appearance: She is ill-appearing.  HENT:     Mouth/Throat:     Mouth: Mucous membranes are moist.     Pharynx: Oropharynx is clear.  Cardiovascular:     Rate and Rhythm: Normal rate and regular rhythm.     Pulses: Normal  pulses.     Heart sounds: Normal heart sounds.  Pulmonary:     Effort: Tachypnea (mild) present. No accessory muscle usage or respiratory distress.     Breath sounds: Normal breath sounds and air entry. No decreased breath sounds or rales.  Abdominal:     General: Abdomen is flat. Bowel sounds are normal. There is no distension.     Palpations: Abdomen is soft.     Tenderness: There is no abdominal tenderness.  Musculoskeletal:     Right lower leg: No edema.     Left lower leg: No edema.  Skin:    General: Skin is warm and dry.     Capillary Refill: Capillary refill takes less than 2 seconds.     Coloration: Skin is pale.  Neurological:     Mental Status: She is alert and oriented to person, place, and time. Mental status is at baseline.     GCS: GCS eye subscore is 4. GCS verbal subscore is 5. GCS motor subscore is 6.  Psychiatric:        Mood and Affect: Mood normal.        Behavior: Behavior normal.     ED Results / Procedures / Treatments   Labs (all labs ordered are listed, but only abnormal results are displayed) Labs Reviewed  CBC WITH DIFFERENTIAL/PLATELET - Abnormal; Notable for the following components:      Result Value   Hemoglobin 16.0 (*)    HCT 49.7 (*)    MCV 104.0 (*)    All other components within normal limits  COMPREHENSIVE METABOLIC PANEL - Abnormal; Notable for the following components:   Sodium 133 (*)    Chloride 95 (*)    Glucose, Bld 122 (*)    BUN 37 (*)    Creatinine, Ser 1.83 (*)    Calcium 8.4 (*)    Albumin 3.2 (*)    Total Bilirubin 1.4 (*)    GFR, Estimated 27 (*)    All other components within normal limits  URINALYSIS, ROUTINE W REFLEX MICROSCOPIC - Abnormal; Notable for the following components:   Leukocytes,Ua MODERATE (*)    All other components within normal limits  TROPONIN I (HIGH SENSITIVITY) - Abnormal; Notable for the following components:   Troponin I (High Sensitivity) 31 (*)    All other components within normal limits   TROPONIN I (HIGH SENSITIVITY) - Abnormal; Notable for the following components:   Troponin I (High Sensitivity) 35 (*)    All other components within normal limits  LIPASE, BLOOD    EKG EKG Interpretation  Date/Time:  Saturday June 12 2022 17:43:29 EDT Ventricular Rate:  87 PR Interval:  263 QRS Duration: 132 QT Interval:  408 QTC Calculation: 491 R Axis:   -45 Text Interpretation: Sinus rhythm Prolonged PR interval Left bundle branch block Confirmed by Ernie Avena ([id number]) on 06/12/2022 5:46:17 PM  Radiology DG Chest Portable 1 View  Result Date: 06/12/2022 CLINICAL DATA:  Chest discomfort. EXAM: PORTABLE CHEST 1 VIEW COMPARISON:  Chest x-ray April 3 24. FINDINGS: Similar positioning of a right IJ approach Port-A-Cath with the tip overlying the proximal SVC. Similar enlarged cardiac silhouette. Stable large hiatal hernia. Mild left basilar opacities are unchanged and likely represent compressive atelectasis. No visible pleural effusions or pneumothorax. IMPRESSION: Large hiatal hernia. Mild left basilar opacities are unchanged and likely represent compressive atelectasis. Electronically Signed   By: Feliberto Harts M.D.   On: 06/12/2022 18:06    Procedures Procedures    Medications Ordered in ED Medications - No data to display  ED Course/ Medical Decision Making/ A&P                             Medical Decision Making Amount and/or Complexity of Data Reviewed Labs: ordered. Radiology: ordered.   This patient presents to the ED with chief complaint(s) of nausea, vomiting, and "sinking" feeling in the chest with pertinent past medical history of HFrEF, nonischemic cardiomyopathy, AKI, diffuse large B-cell lymphoma, GERD.  The complaint involves an extensive differential diagnosis and also carries with it a high risk of complications and morbidity.    The differential diagnosis includes ACS, cardiac dysrhythmia, dehydration, electrolyte derangement, gastritis,  gastroenteritis, GERD  The initial plan is to obtain baseline labs and troponin, lipase, UA  Additional history obtained: Additional history obtained from family, patient's daughter is at bedside who is able to provide some HPI.  Patient has intermittent confusion which is her baseline. Records reviewed previous admission documents, patient discharged from hospital yesterday.  Was admitted for acute on chronic CHF exacerbation.  Patient was stable for discharge to a rehab/SNF.  It was decided that due to patient's known intermittent confusion, that it would be best for her to not be discharged home.  Initial Assessment:   On exam, patient is resting comfortably in bed and is not in acute distress.  She is ill-appearing and somewhat pale.  Abdomen is soft and nontender to palpation.  No abdominal distention.  Heart rate is normal in the 80s.  Lungs clear to auscultation bilaterally.  She is mildly tachypneic with respiratory rate around 21, but is not complaining of shortness of breath and does not appear to be in respiratory distress.  Patient is not currently vomiting.  Skin is warm and dry.  Vital signs are stable.  Independent ECG/labs interpretation:  The following labs were independently interpreted:  CBC with mild erythrocytosis, no leukocytosis.  Metabolic panel with mild hyponatremia.  Corrected calcium is normal.  Renal function appears to be at patient's baseline.  Lipase not indicative of pancreatitis.  Initial troponin was 31, repeat 35.  Delta troponin of 4.  UA with moderate leukocytes, no bacteria, no nitrite.  Not indicative of UTI.  Independent visualization and interpretation of imaging: I independently visualized the following imaging with scope of interpretation limited to determining acute life threatening conditions related to emergency care: Chest x-ray, which revealed stable large cardiac silhouette, no evidence of pulmonary edema, pleural effusion, infiltrate.  I agree with  radiologist interpretation.  Patient does have a large, stable hiatal hernia.  Treatment and Reassessment: Patient not currently complaining of any nausea or vomiting.  She was given IV Zofran by EMS prior to ED arrival.  Patient reports she is starting to feel better, but does continue to have intermittent episodes of nausea that dissipate  on their own.  Patient does not wish to receive any more nausea medication at this time.  Upon reassessment, patient reports feeling much improved and would like a p.o. challenge.  Patient successfully passed her p.o. challenge and is appropriate for discharge back to rehab/SNF.  Her color has improved and she is no longer pale in appearance.  She has had no episodes of vomiting while in the ED.    Disposition:   I believe that patient is stable and appropriate for discharge back to facility.  Patient is not currently complaining of abdominal pain, chest pain, nausea or vomiting.  She has had resolution of symptoms.  Patient does have good outpatient follow-up and is currently under the care of a rehab/SNF.  Recommended patient continue to take her medications as prescribed.  Patient has cardiology follow-up scheduled for later this month.  Recommended PCP follow up as well.   The patient has been appropriately medically screened and/or stabilized in the ED. I have low suspicion for any other emergent medical condition which would require further screening, evaluation or treatment in the ED or require inpatient management. At time of discharge the patient is hemodynamically stable and in no acute distress. I have discussed work-up results and diagnosis with patient and answered all questions. Patient is agreeable with discharge plan. We discussed strict return precautions for returning to the emergency department and they verbalized understanding.            Final Clinical Impression(s) / ED Diagnoses Final diagnoses:  Nausea and vomiting, unspecified  vomiting type  Discomfort in chest    Rx / DC Orders ED Discharge Orders     None         Lenard Simmer, PA-C 06/12/22 2021    Ernie Avena, MD 06/12/22 2037

## 2022-06-12 NOTE — ED Triage Notes (Signed)
Pt present to ED from [facility] for c/o nausea. Pt had cardioversion on Friday, states feeling fine until today at 1500. Pt described to EMS a "sinking" feeling in chest.  Pt received zofran by mouth via the facility and zofran IV via EMS. Pt denies chest pain at this time. Pt A&Ox4 at this time.

## 2022-06-12 NOTE — Discharge Instructions (Addendum)
Thank you for allowing me to be a part of your care today.   Your work-up was overall reassuring and your labs are stable.  I do recommend following up with your cardiologist as scheduled on 06/21/22.  I also recommend following up with primary care provider, especially if you continue to have intermittent nausea.   Return to the ED if you experience worsening of your symptoms or if you have any new concerns.

## 2022-06-14 DIAGNOSIS — I5023 Acute on chronic systolic (congestive) heart failure: Secondary | ICD-10-CM | POA: Diagnosis not present

## 2022-06-14 DIAGNOSIS — N183 Chronic kidney disease, stage 3 unspecified: Secondary | ICD-10-CM | POA: Diagnosis not present

## 2022-06-14 DIAGNOSIS — I483 Typical atrial flutter: Secondary | ICD-10-CM | POA: Diagnosis not present

## 2022-06-14 DIAGNOSIS — N179 Acute kidney failure, unspecified: Secondary | ICD-10-CM | POA: Diagnosis not present

## 2022-06-15 ENCOUNTER — Telehealth: Payer: Self-pay

## 2022-06-15 DIAGNOSIS — I480 Paroxysmal atrial fibrillation: Secondary | ICD-10-CM | POA: Diagnosis not present

## 2022-06-15 DIAGNOSIS — R54 Age-related physical debility: Secondary | ICD-10-CM | POA: Diagnosis not present

## 2022-06-15 DIAGNOSIS — E785 Hyperlipidemia, unspecified: Secondary | ICD-10-CM | POA: Diagnosis not present

## 2022-06-15 DIAGNOSIS — I509 Heart failure, unspecified: Secondary | ICD-10-CM | POA: Diagnosis not present

## 2022-06-15 NOTE — Telephone Encounter (Signed)
Updated dx, faxed to [employer] for DOS: 05/25/2022. Letter processed to scan

## 2022-06-16 DIAGNOSIS — N183 Chronic kidney disease, stage 3 unspecified: Secondary | ICD-10-CM | POA: Diagnosis not present

## 2022-06-16 DIAGNOSIS — E878 Other disorders of electrolyte and fluid balance, not elsewhere classified: Secondary | ICD-10-CM | POA: Diagnosis not present

## 2022-06-16 DIAGNOSIS — N179 Acute kidney failure, unspecified: Secondary | ICD-10-CM | POA: Diagnosis not present

## 2022-06-16 DIAGNOSIS — Z5181 Encounter for therapeutic drug level monitoring: Secondary | ICD-10-CM | POA: Diagnosis not present

## 2022-06-17 DIAGNOSIS — E039 Hypothyroidism, unspecified: Secondary | ICD-10-CM | POA: Diagnosis not present

## 2022-06-17 DIAGNOSIS — K219 Gastro-esophageal reflux disease without esophagitis: Secondary | ICD-10-CM | POA: Diagnosis not present

## 2022-06-17 DIAGNOSIS — I5023 Acute on chronic systolic (congestive) heart failure: Secondary | ICD-10-CM | POA: Diagnosis not present

## 2022-06-17 DIAGNOSIS — R609 Edema, unspecified: Secondary | ICD-10-CM | POA: Diagnosis not present

## 2022-06-17 DIAGNOSIS — R531 Weakness: Secondary | ICD-10-CM | POA: Diagnosis not present

## 2022-06-17 DIAGNOSIS — F4320 Adjustment disorder, unspecified: Secondary | ICD-10-CM | POA: Diagnosis not present

## 2022-06-17 DIAGNOSIS — I480 Paroxysmal atrial fibrillation: Secondary | ICD-10-CM | POA: Diagnosis not present

## 2022-06-17 DIAGNOSIS — I483 Typical atrial flutter: Secondary | ICD-10-CM | POA: Diagnosis not present

## 2022-06-17 DIAGNOSIS — G3184 Mild cognitive impairment, so stated: Secondary | ICD-10-CM | POA: Diagnosis not present

## 2022-06-17 DIAGNOSIS — R7611 Nonspecific reaction to tuberculin skin test without active tuberculosis: Secondary | ICD-10-CM | POA: Diagnosis not present

## 2022-06-17 DIAGNOSIS — E785 Hyperlipidemia, unspecified: Secondary | ICD-10-CM | POA: Diagnosis not present

## 2022-06-17 DIAGNOSIS — I499 Cardiac arrhythmia, unspecified: Secondary | ICD-10-CM | POA: Diagnosis not present

## 2022-06-17 DIAGNOSIS — F5101 Primary insomnia: Secondary | ICD-10-CM | POA: Diagnosis not present

## 2022-06-17 DIAGNOSIS — I509 Heart failure, unspecified: Secondary | ICD-10-CM | POA: Diagnosis not present

## 2022-06-17 DIAGNOSIS — R2681 Unsteadiness on feet: Secondary | ICD-10-CM | POA: Diagnosis not present

## 2022-06-17 DIAGNOSIS — Z7901 Long term (current) use of anticoagulants: Secondary | ICD-10-CM | POA: Diagnosis not present

## 2022-06-17 DIAGNOSIS — F32A Depression, unspecified: Secondary | ICD-10-CM | POA: Diagnosis not present

## 2022-06-17 DIAGNOSIS — R112 Nausea with vomiting, unspecified: Secondary | ICD-10-CM | POA: Diagnosis not present

## 2022-06-17 DIAGNOSIS — N1832 Chronic kidney disease, stage 3b: Secondary | ICD-10-CM | POA: Diagnosis not present

## 2022-06-17 DIAGNOSIS — M6289 Other specified disorders of muscle: Secondary | ICD-10-CM | POA: Diagnosis not present

## 2022-06-17 DIAGNOSIS — R54 Age-related physical debility: Secondary | ICD-10-CM | POA: Diagnosis not present

## 2022-06-21 ENCOUNTER — Encounter (HOSPITAL_COMMUNITY): Payer: Medicare Other

## 2022-06-21 DIAGNOSIS — F5101 Primary insomnia: Secondary | ICD-10-CM | POA: Diagnosis not present

## 2022-06-21 DIAGNOSIS — F4320 Adjustment disorder, unspecified: Secondary | ICD-10-CM | POA: Diagnosis not present

## 2022-06-21 DIAGNOSIS — F32A Depression, unspecified: Secondary | ICD-10-CM | POA: Diagnosis not present

## 2022-06-21 DIAGNOSIS — G3184 Mild cognitive impairment, so stated: Secondary | ICD-10-CM | POA: Diagnosis not present

## 2022-06-30 ENCOUNTER — Encounter (HOSPITAL_COMMUNITY): Payer: Medicare Other

## 2022-07-05 DIAGNOSIS — F5101 Primary insomnia: Secondary | ICD-10-CM | POA: Diagnosis not present

## 2022-07-05 DIAGNOSIS — G3184 Mild cognitive impairment, so stated: Secondary | ICD-10-CM | POA: Diagnosis not present

## 2022-07-05 DIAGNOSIS — F32A Depression, unspecified: Secondary | ICD-10-CM | POA: Diagnosis not present

## 2022-07-05 DIAGNOSIS — F4320 Adjustment disorder, unspecified: Secondary | ICD-10-CM | POA: Diagnosis not present

## 2022-07-09 ENCOUNTER — Other Ambulatory Visit: Payer: Self-pay | Admitting: *Deleted

## 2022-07-09 NOTE — Patient Outreach (Addendum)
Per [facility] Mrs. Hugh resides in [facility] skilled nursing facility. Screening for potential [facility] care coordination services as benefit of health plan and Primary Care Provider.   Update received from [facility] discharge planner indicating family wants Mrs. Mullett to remain in long term care post skilled stay.   Will continue to follow.    Raiford Noble, MSN, RN,BSN [facility] Post Acute Care Coordinator [phone] (Direct dial)

## 2022-07-14 DIAGNOSIS — C8590 Non-Hodgkin lymphoma, unspecified, unspecified site: Secondary | ICD-10-CM | POA: Diagnosis not present

## 2022-07-14 DIAGNOSIS — K219 Gastro-esophageal reflux disease without esophagitis: Secondary | ICD-10-CM | POA: Diagnosis not present

## 2022-07-14 DIAGNOSIS — I5023 Acute on chronic systolic (congestive) heart failure: Secondary | ICD-10-CM | POA: Diagnosis not present

## 2022-07-14 DIAGNOSIS — Z7901 Long term (current) use of anticoagulants: Secondary | ICD-10-CM | POA: Diagnosis not present

## 2022-07-14 DIAGNOSIS — Z9181 History of falling: Secondary | ICD-10-CM | POA: Diagnosis not present

## 2022-07-14 DIAGNOSIS — N1832 Chronic kidney disease, stage 3b: Secondary | ICD-10-CM | POA: Diagnosis not present

## 2022-07-14 DIAGNOSIS — E039 Hypothyroidism, unspecified: Secondary | ICD-10-CM | POA: Diagnosis not present

## 2022-07-14 DIAGNOSIS — Z602 Problems related to living alone: Secondary | ICD-10-CM | POA: Diagnosis not present

## 2022-07-14 DIAGNOSIS — I4892 Unspecified atrial flutter: Secondary | ICD-10-CM | POA: Diagnosis not present

## 2022-07-14 DIAGNOSIS — I480 Paroxysmal atrial fibrillation: Secondary | ICD-10-CM | POA: Diagnosis not present

## 2022-07-14 DIAGNOSIS — E785 Hyperlipidemia, unspecified: Secondary | ICD-10-CM | POA: Diagnosis not present

## 2022-07-17 DIAGNOSIS — I5023 Acute on chronic systolic (congestive) heart failure: Secondary | ICD-10-CM | POA: Diagnosis not present

## 2022-07-17 DIAGNOSIS — I480 Paroxysmal atrial fibrillation: Secondary | ICD-10-CM | POA: Diagnosis not present

## 2022-07-17 DIAGNOSIS — N1832 Chronic kidney disease, stage 3b: Secondary | ICD-10-CM | POA: Diagnosis not present

## 2022-07-17 DIAGNOSIS — I4892 Unspecified atrial flutter: Secondary | ICD-10-CM | POA: Diagnosis not present

## 2022-07-17 DIAGNOSIS — C8590 Non-Hodgkin lymphoma, unspecified, unspecified site: Secondary | ICD-10-CM | POA: Diagnosis not present

## 2022-07-17 DIAGNOSIS — K219 Gastro-esophageal reflux disease without esophagitis: Secondary | ICD-10-CM | POA: Diagnosis not present

## 2022-07-19 DIAGNOSIS — N1832 Chronic kidney disease, stage 3b: Secondary | ICD-10-CM | POA: Diagnosis not present

## 2022-07-19 DIAGNOSIS — I4892 Unspecified atrial flutter: Secondary | ICD-10-CM | POA: Diagnosis not present

## 2022-07-19 DIAGNOSIS — C8590 Non-Hodgkin lymphoma, unspecified, unspecified site: Secondary | ICD-10-CM | POA: Diagnosis not present

## 2022-07-19 DIAGNOSIS — I5023 Acute on chronic systolic (congestive) heart failure: Secondary | ICD-10-CM | POA: Diagnosis not present

## 2022-07-19 DIAGNOSIS — K219 Gastro-esophageal reflux disease without esophagitis: Secondary | ICD-10-CM | POA: Diagnosis not present

## 2022-07-19 DIAGNOSIS — I480 Paroxysmal atrial fibrillation: Secondary | ICD-10-CM | POA: Diagnosis not present

## 2022-07-20 DIAGNOSIS — I4892 Unspecified atrial flutter: Secondary | ICD-10-CM | POA: Diagnosis not present

## 2022-07-20 DIAGNOSIS — N183 Chronic kidney disease, stage 3 unspecified: Secondary | ICD-10-CM | POA: Diagnosis not present

## 2022-07-20 DIAGNOSIS — I5022 Chronic systolic (congestive) heart failure: Secondary | ICD-10-CM | POA: Diagnosis not present

## 2022-07-20 DIAGNOSIS — E032 Hypothyroidism due to medicaments and other exogenous substances: Secondary | ICD-10-CM | POA: Diagnosis not present

## 2022-07-20 DIAGNOSIS — I4891 Unspecified atrial fibrillation: Secondary | ICD-10-CM | POA: Diagnosis not present

## 2022-07-22 DIAGNOSIS — N1832 Chronic kidney disease, stage 3b: Secondary | ICD-10-CM | POA: Diagnosis not present

## 2022-07-22 DIAGNOSIS — I4892 Unspecified atrial flutter: Secondary | ICD-10-CM | POA: Diagnosis not present

## 2022-07-22 DIAGNOSIS — I480 Paroxysmal atrial fibrillation: Secondary | ICD-10-CM | POA: Diagnosis not present

## 2022-07-22 DIAGNOSIS — C8590 Non-Hodgkin lymphoma, unspecified, unspecified site: Secondary | ICD-10-CM | POA: Diagnosis not present

## 2022-07-22 DIAGNOSIS — I5023 Acute on chronic systolic (congestive) heart failure: Secondary | ICD-10-CM | POA: Diagnosis not present

## 2022-07-22 DIAGNOSIS — K219 Gastro-esophageal reflux disease without esophagitis: Secondary | ICD-10-CM | POA: Diagnosis not present

## 2022-07-26 DIAGNOSIS — N1832 Chronic kidney disease, stage 3b: Secondary | ICD-10-CM | POA: Diagnosis not present

## 2022-07-26 DIAGNOSIS — C8590 Non-Hodgkin lymphoma, unspecified, unspecified site: Secondary | ICD-10-CM | POA: Diagnosis not present

## 2022-07-26 DIAGNOSIS — I480 Paroxysmal atrial fibrillation: Secondary | ICD-10-CM | POA: Diagnosis not present

## 2022-07-26 DIAGNOSIS — K219 Gastro-esophageal reflux disease without esophagitis: Secondary | ICD-10-CM | POA: Diagnosis not present

## 2022-07-26 DIAGNOSIS — I5023 Acute on chronic systolic (congestive) heart failure: Secondary | ICD-10-CM | POA: Diagnosis not present

## 2022-07-26 DIAGNOSIS — I4892 Unspecified atrial flutter: Secondary | ICD-10-CM | POA: Diagnosis not present

## 2022-07-28 DIAGNOSIS — I480 Paroxysmal atrial fibrillation: Secondary | ICD-10-CM | POA: Diagnosis not present

## 2022-07-28 DIAGNOSIS — C8590 Non-Hodgkin lymphoma, unspecified, unspecified site: Secondary | ICD-10-CM | POA: Diagnosis not present

## 2022-07-28 DIAGNOSIS — K219 Gastro-esophageal reflux disease without esophagitis: Secondary | ICD-10-CM | POA: Diagnosis not present

## 2022-07-28 DIAGNOSIS — I4892 Unspecified atrial flutter: Secondary | ICD-10-CM | POA: Diagnosis not present

## 2022-07-28 DIAGNOSIS — I5023 Acute on chronic systolic (congestive) heart failure: Secondary | ICD-10-CM | POA: Diagnosis not present

## 2022-07-28 DIAGNOSIS — N1832 Chronic kidney disease, stage 3b: Secondary | ICD-10-CM | POA: Diagnosis not present

## 2022-08-01 DIAGNOSIS — J9601 Acute respiratory failure with hypoxia: Secondary | ICD-10-CM | POA: Diagnosis not present

## 2022-08-01 DIAGNOSIS — R000 Tachycardia, unspecified: Secondary | ICD-10-CM | POA: Diagnosis not present

## 2022-08-01 DIAGNOSIS — I10 Essential (primary) hypertension: Secondary | ICD-10-CM | POA: Diagnosis not present

## 2022-08-01 DIAGNOSIS — R0902 Hypoxemia: Secondary | ICD-10-CM | POA: Diagnosis not present

## 2022-08-01 DIAGNOSIS — J811 Chronic pulmonary edema: Secondary | ICD-10-CM | POA: Diagnosis not present

## 2022-08-01 DIAGNOSIS — K5730 Diverticulosis of large intestine without perforation or abscess without bleeding: Secondary | ICD-10-CM | POA: Diagnosis not present

## 2022-08-01 DIAGNOSIS — N200 Calculus of kidney: Secondary | ICD-10-CM | POA: Diagnosis not present

## 2022-08-01 DIAGNOSIS — I509 Heart failure, unspecified: Secondary | ICD-10-CM | POA: Diagnosis not present

## 2022-08-01 DIAGNOSIS — R0602 Shortness of breath: Secondary | ICD-10-CM | POA: Diagnosis not present

## 2022-08-01 DIAGNOSIS — E875 Hyperkalemia: Secondary | ICD-10-CM | POA: Diagnosis not present

## 2022-08-01 DIAGNOSIS — N179 Acute kidney failure, unspecified: Secondary | ICD-10-CM | POA: Diagnosis not present

## 2022-08-02 DIAGNOSIS — Z882 Allergy status to sulfonamides status: Secondary | ICD-10-CM | POA: Diagnosis not present

## 2022-08-02 DIAGNOSIS — I130 Hypertensive heart and chronic kidney disease with heart failure and stage 1 through stage 4 chronic kidney disease, or unspecified chronic kidney disease: Secondary | ICD-10-CM | POA: Diagnosis not present

## 2022-08-02 DIAGNOSIS — N200 Calculus of kidney: Secondary | ICD-10-CM | POA: Diagnosis not present

## 2022-08-02 DIAGNOSIS — K5730 Diverticulosis of large intestine without perforation or abscess without bleeding: Secondary | ICD-10-CM | POA: Diagnosis not present

## 2022-08-02 DIAGNOSIS — I5023 Acute on chronic systolic (congestive) heart failure: Secondary | ICD-10-CM | POA: Diagnosis not present

## 2022-08-02 DIAGNOSIS — N184 Chronic kidney disease, stage 4 (severe): Secondary | ICD-10-CM | POA: Diagnosis not present

## 2022-08-02 DIAGNOSIS — D510 Vitamin B12 deficiency anemia due to intrinsic factor deficiency: Secondary | ICD-10-CM | POA: Diagnosis not present

## 2022-08-02 DIAGNOSIS — D649 Anemia, unspecified: Secondary | ICD-10-CM | POA: Diagnosis not present

## 2022-08-02 DIAGNOSIS — J9811 Atelectasis: Secondary | ICD-10-CM | POA: Diagnosis not present

## 2022-08-02 DIAGNOSIS — Z8571 Personal history of Hodgkin lymphoma: Secondary | ICD-10-CM | POA: Diagnosis not present

## 2022-08-02 DIAGNOSIS — Z7901 Long term (current) use of anticoagulants: Secondary | ICD-10-CM | POA: Diagnosis not present

## 2022-08-02 DIAGNOSIS — J9601 Acute respiratory failure with hypoxia: Secondary | ICD-10-CM | POA: Diagnosis not present

## 2022-08-02 DIAGNOSIS — I700 Atherosclerosis of aorta: Secondary | ICD-10-CM | POA: Diagnosis not present

## 2022-08-02 DIAGNOSIS — E860 Dehydration: Secondary | ICD-10-CM | POA: Diagnosis present

## 2022-08-02 DIAGNOSIS — Z91041 Radiographic dye allergy status: Secondary | ICD-10-CM | POA: Diagnosis not present

## 2022-08-02 DIAGNOSIS — I428 Other cardiomyopathies: Secondary | ICD-10-CM | POA: Diagnosis present

## 2022-08-02 DIAGNOSIS — R0682 Tachypnea, not elsewhere classified: Secondary | ICD-10-CM | POA: Diagnosis not present

## 2022-08-02 DIAGNOSIS — Z87442 Personal history of urinary calculi: Secondary | ICD-10-CM | POA: Diagnosis not present

## 2022-08-02 DIAGNOSIS — K219 Gastro-esophageal reflux disease without esophagitis: Secondary | ICD-10-CM | POA: Diagnosis present

## 2022-08-02 DIAGNOSIS — I5021 Acute systolic (congestive) heart failure: Secondary | ICD-10-CM | POA: Diagnosis not present

## 2022-08-02 DIAGNOSIS — I10 Essential (primary) hypertension: Secondary | ICD-10-CM | POA: Diagnosis not present

## 2022-08-02 DIAGNOSIS — E875 Hyperkalemia: Secondary | ICD-10-CM | POA: Diagnosis not present

## 2022-08-02 DIAGNOSIS — I4892 Unspecified atrial flutter: Secondary | ICD-10-CM | POA: Diagnosis present

## 2022-08-02 DIAGNOSIS — K449 Diaphragmatic hernia without obstruction or gangrene: Secondary | ICD-10-CM | POA: Diagnosis present

## 2022-08-02 DIAGNOSIS — I482 Chronic atrial fibrillation, unspecified: Secondary | ICD-10-CM | POA: Diagnosis not present

## 2022-08-02 DIAGNOSIS — R0602 Shortness of breath: Secondary | ICD-10-CM | POA: Diagnosis not present

## 2022-08-02 DIAGNOSIS — Z880 Allergy status to penicillin: Secondary | ICD-10-CM | POA: Diagnosis not present

## 2022-08-02 DIAGNOSIS — N1832 Chronic kidney disease, stage 3b: Secondary | ICD-10-CM | POA: Diagnosis not present

## 2022-08-02 DIAGNOSIS — R570 Cardiogenic shock: Secondary | ICD-10-CM | POA: Diagnosis not present

## 2022-08-02 DIAGNOSIS — I447 Left bundle-branch block, unspecified: Secondary | ICD-10-CM | POA: Diagnosis not present

## 2022-08-02 DIAGNOSIS — Z7401 Bed confinement status: Secondary | ICD-10-CM | POA: Diagnosis not present

## 2022-08-02 DIAGNOSIS — N170 Acute kidney failure with tubular necrosis: Secondary | ICD-10-CM | POA: Diagnosis not present

## 2022-08-02 DIAGNOSIS — C8590 Non-Hodgkin lymphoma, unspecified, unspecified site: Secondary | ICD-10-CM | POA: Diagnosis not present

## 2022-08-02 DIAGNOSIS — Z8711 Personal history of peptic ulcer disease: Secondary | ICD-10-CM | POA: Diagnosis not present

## 2022-08-02 DIAGNOSIS — J811 Chronic pulmonary edema: Secondary | ICD-10-CM | POA: Diagnosis not present

## 2022-08-02 DIAGNOSIS — N179 Acute kidney failure, unspecified: Secondary | ICD-10-CM | POA: Diagnosis not present

## 2022-08-02 DIAGNOSIS — E039 Hypothyroidism, unspecified: Secondary | ICD-10-CM | POA: Diagnosis not present

## 2022-08-02 DIAGNOSIS — J90 Pleural effusion, not elsewhere classified: Secondary | ICD-10-CM | POA: Diagnosis not present

## 2022-08-02 DIAGNOSIS — F419 Anxiety disorder, unspecified: Secondary | ICD-10-CM | POA: Diagnosis present

## 2022-08-02 DIAGNOSIS — I4891 Unspecified atrial fibrillation: Secondary | ICD-10-CM | POA: Diagnosis not present

## 2022-08-02 DIAGNOSIS — J9600 Acute respiratory failure, unspecified whether with hypoxia or hypercapnia: Secondary | ICD-10-CM | POA: Diagnosis not present

## 2022-08-02 DIAGNOSIS — I959 Hypotension, unspecified: Secondary | ICD-10-CM | POA: Diagnosis not present

## 2022-08-02 DIAGNOSIS — Z87891 Personal history of nicotine dependence: Secondary | ICD-10-CM | POA: Diagnosis not present

## 2022-08-02 DIAGNOSIS — T68XXXA Hypothermia, initial encounter: Secondary | ICD-10-CM | POA: Diagnosis present

## 2022-08-02 DIAGNOSIS — I509 Heart failure, unspecified: Secondary | ICD-10-CM | POA: Diagnosis not present

## 2022-08-03 DIAGNOSIS — R0682 Tachypnea, not elsewhere classified: Secondary | ICD-10-CM | POA: Diagnosis not present

## 2022-08-03 DIAGNOSIS — J811 Chronic pulmonary edema: Secondary | ICD-10-CM | POA: Diagnosis not present

## 2022-08-03 DIAGNOSIS — I5023 Acute on chronic systolic (congestive) heart failure: Secondary | ICD-10-CM | POA: Diagnosis not present

## 2022-08-03 DIAGNOSIS — I700 Atherosclerosis of aorta: Secondary | ICD-10-CM | POA: Diagnosis not present

## 2022-08-03 DIAGNOSIS — I4891 Unspecified atrial fibrillation: Secondary | ICD-10-CM | POA: Diagnosis not present

## 2022-08-03 DIAGNOSIS — I428 Other cardiomyopathies: Secondary | ICD-10-CM | POA: Diagnosis not present

## 2022-08-03 DIAGNOSIS — I509 Heart failure, unspecified: Secondary | ICD-10-CM | POA: Diagnosis not present

## 2022-08-03 DIAGNOSIS — Z7901 Long term (current) use of anticoagulants: Secondary | ICD-10-CM | POA: Diagnosis not present

## 2022-08-03 DIAGNOSIS — N170 Acute kidney failure with tubular necrosis: Secondary | ICD-10-CM | POA: Diagnosis not present

## 2022-08-03 DIAGNOSIS — J90 Pleural effusion, not elsewhere classified: Secondary | ICD-10-CM | POA: Diagnosis not present

## 2022-08-03 DIAGNOSIS — J9811 Atelectasis: Secondary | ICD-10-CM | POA: Diagnosis not present

## 2022-08-03 DIAGNOSIS — I447 Left bundle-branch block, unspecified: Secondary | ICD-10-CM | POA: Diagnosis not present

## 2022-08-03 DIAGNOSIS — N179 Acute kidney failure, unspecified: Secondary | ICD-10-CM | POA: Diagnosis not present

## 2022-08-03 DIAGNOSIS — I959 Hypotension, unspecified: Secondary | ICD-10-CM | POA: Diagnosis not present

## 2022-08-04 DIAGNOSIS — I959 Hypotension, unspecified: Secondary | ICD-10-CM | POA: Diagnosis not present

## 2022-08-04 DIAGNOSIS — I509 Heart failure, unspecified: Secondary | ICD-10-CM | POA: Diagnosis not present

## 2022-08-04 DIAGNOSIS — I4891 Unspecified atrial fibrillation: Secondary | ICD-10-CM | POA: Diagnosis not present

## 2022-08-04 DIAGNOSIS — Z7901 Long term (current) use of anticoagulants: Secondary | ICD-10-CM | POA: Diagnosis not present

## 2022-08-04 DIAGNOSIS — I447 Left bundle-branch block, unspecified: Secondary | ICD-10-CM | POA: Diagnosis not present

## 2022-08-04 DIAGNOSIS — I5023 Acute on chronic systolic (congestive) heart failure: Secondary | ICD-10-CM | POA: Diagnosis not present

## 2022-08-04 DIAGNOSIS — I428 Other cardiomyopathies: Secondary | ICD-10-CM | POA: Diagnosis not present

## 2022-08-04 DIAGNOSIS — N179 Acute kidney failure, unspecified: Secondary | ICD-10-CM | POA: Diagnosis not present

## 2022-08-04 DIAGNOSIS — R0682 Tachypnea, not elsewhere classified: Secondary | ICD-10-CM | POA: Diagnosis not present

## 2022-08-04 DIAGNOSIS — N170 Acute kidney failure with tubular necrosis: Secondary | ICD-10-CM | POA: Diagnosis not present

## 2022-08-05 DIAGNOSIS — N170 Acute kidney failure with tubular necrosis: Secondary | ICD-10-CM | POA: Diagnosis not present

## 2022-08-05 DIAGNOSIS — I4891 Unspecified atrial fibrillation: Secondary | ICD-10-CM | POA: Diagnosis not present

## 2022-08-05 DIAGNOSIS — I959 Hypotension, unspecified: Secondary | ICD-10-CM | POA: Diagnosis not present

## 2022-08-05 DIAGNOSIS — N179 Acute kidney failure, unspecified: Secondary | ICD-10-CM | POA: Diagnosis not present

## 2022-08-05 DIAGNOSIS — R0682 Tachypnea, not elsewhere classified: Secondary | ICD-10-CM | POA: Diagnosis not present

## 2022-08-05 DIAGNOSIS — I428 Other cardiomyopathies: Secondary | ICD-10-CM | POA: Diagnosis not present

## 2022-08-05 DIAGNOSIS — I5023 Acute on chronic systolic (congestive) heart failure: Secondary | ICD-10-CM | POA: Diagnosis not present

## 2022-08-05 DIAGNOSIS — I509 Heart failure, unspecified: Secondary | ICD-10-CM | POA: Diagnosis not present

## 2022-08-05 DIAGNOSIS — I447 Left bundle-branch block, unspecified: Secondary | ICD-10-CM | POA: Diagnosis not present

## 2022-08-05 DIAGNOSIS — Z7901 Long term (current) use of anticoagulants: Secondary | ICD-10-CM | POA: Diagnosis not present

## 2022-08-06 DIAGNOSIS — J90 Pleural effusion, not elsewhere classified: Secondary | ICD-10-CM | POA: Diagnosis not present

## 2022-08-06 DIAGNOSIS — R0602 Shortness of breath: Secondary | ICD-10-CM | POA: Diagnosis not present

## 2022-08-06 DIAGNOSIS — I509 Heart failure, unspecified: Secondary | ICD-10-CM | POA: Diagnosis not present

## 2022-08-06 DIAGNOSIS — I428 Other cardiomyopathies: Secondary | ICD-10-CM | POA: Diagnosis not present

## 2022-08-06 DIAGNOSIS — I4891 Unspecified atrial fibrillation: Secondary | ICD-10-CM | POA: Diagnosis not present

## 2022-08-06 DIAGNOSIS — I959 Hypotension, unspecified: Secondary | ICD-10-CM | POA: Diagnosis not present

## 2022-08-06 DIAGNOSIS — N179 Acute kidney failure, unspecified: Secondary | ICD-10-CM | POA: Diagnosis not present

## 2022-08-06 DIAGNOSIS — N170 Acute kidney failure with tubular necrosis: Secondary | ICD-10-CM | POA: Diagnosis not present

## 2022-08-06 DIAGNOSIS — I5023 Acute on chronic systolic (congestive) heart failure: Secondary | ICD-10-CM | POA: Diagnosis not present

## 2022-08-06 DIAGNOSIS — Z7901 Long term (current) use of anticoagulants: Secondary | ICD-10-CM | POA: Diagnosis not present

## 2022-08-07 DIAGNOSIS — Z7901 Long term (current) use of anticoagulants: Secondary | ICD-10-CM

## 2022-08-07 DIAGNOSIS — I4891 Unspecified atrial fibrillation: Secondary | ICD-10-CM

## 2022-08-07 DIAGNOSIS — I428 Other cardiomyopathies: Secondary | ICD-10-CM

## 2022-08-08 DIAGNOSIS — I428 Other cardiomyopathies: Secondary | ICD-10-CM | POA: Diagnosis not present

## 2022-08-08 DIAGNOSIS — Z7901 Long term (current) use of anticoagulants: Secondary | ICD-10-CM | POA: Diagnosis not present

## 2022-08-08 DIAGNOSIS — I4891 Unspecified atrial fibrillation: Secondary | ICD-10-CM | POA: Diagnosis not present

## 2022-08-09 DIAGNOSIS — I5021 Acute systolic (congestive) heart failure: Secondary | ICD-10-CM | POA: Diagnosis not present

## 2022-08-09 DIAGNOSIS — I4892 Unspecified atrial flutter: Secondary | ICD-10-CM | POA: Diagnosis not present

## 2022-08-09 DIAGNOSIS — I4891 Unspecified atrial fibrillation: Secondary | ICD-10-CM | POA: Diagnosis not present

## 2022-08-10 DIAGNOSIS — I4891 Unspecified atrial fibrillation: Secondary | ICD-10-CM | POA: Diagnosis not present

## 2022-08-10 DIAGNOSIS — I5021 Acute systolic (congestive) heart failure: Secondary | ICD-10-CM | POA: Diagnosis not present

## 2022-08-10 DIAGNOSIS — I4892 Unspecified atrial flutter: Secondary | ICD-10-CM | POA: Diagnosis not present

## 2022-08-11 DIAGNOSIS — I4891 Unspecified atrial fibrillation: Secondary | ICD-10-CM | POA: Diagnosis not present

## 2022-08-11 DIAGNOSIS — K449 Diaphragmatic hernia without obstruction or gangrene: Secondary | ICD-10-CM | POA: Diagnosis not present

## 2022-08-11 DIAGNOSIS — C8590 Non-Hodgkin lymphoma, unspecified, unspecified site: Secondary | ICD-10-CM | POA: Diagnosis not present

## 2022-08-11 DIAGNOSIS — E875 Hyperkalemia: Secondary | ICD-10-CM | POA: Diagnosis not present

## 2022-08-11 DIAGNOSIS — I130 Hypertensive heart and chronic kidney disease with heart failure and stage 1 through stage 4 chronic kidney disease, or unspecified chronic kidney disease: Secondary | ICD-10-CM | POA: Diagnosis not present

## 2022-08-11 DIAGNOSIS — D649 Anemia, unspecified: Secondary | ICD-10-CM | POA: Diagnosis not present

## 2022-08-11 DIAGNOSIS — K219 Gastro-esophageal reflux disease without esophagitis: Secondary | ICD-10-CM | POA: Diagnosis not present

## 2022-08-11 DIAGNOSIS — J9621 Acute and chronic respiratory failure with hypoxia: Secondary | ICD-10-CM | POA: Diagnosis not present

## 2022-08-11 DIAGNOSIS — F419 Anxiety disorder, unspecified: Secondary | ICD-10-CM | POA: Diagnosis not present

## 2022-08-11 DIAGNOSIS — I959 Hypotension, unspecified: Secondary | ICD-10-CM | POA: Diagnosis not present

## 2022-08-11 DIAGNOSIS — I428 Other cardiomyopathies: Secondary | ICD-10-CM | POA: Diagnosis not present

## 2022-08-11 DIAGNOSIS — R2689 Other abnormalities of gait and mobility: Secondary | ICD-10-CM | POA: Diagnosis not present

## 2022-08-11 DIAGNOSIS — M79651 Pain in right thigh: Secondary | ICD-10-CM | POA: Diagnosis not present

## 2022-08-11 DIAGNOSIS — I482 Chronic atrial fibrillation, unspecified: Secondary | ICD-10-CM | POA: Diagnosis not present

## 2022-08-11 DIAGNOSIS — N1832 Chronic kidney disease, stage 3b: Secondary | ICD-10-CM | POA: Diagnosis not present

## 2022-08-11 DIAGNOSIS — Z7901 Long term (current) use of anticoagulants: Secondary | ICD-10-CM | POA: Diagnosis not present

## 2022-08-11 DIAGNOSIS — M545 Low back pain, unspecified: Secondary | ICD-10-CM | POA: Diagnosis not present

## 2022-08-11 DIAGNOSIS — Z7401 Bed confinement status: Secondary | ICD-10-CM | POA: Diagnosis not present

## 2022-08-11 DIAGNOSIS — R570 Cardiogenic shock: Secondary | ICD-10-CM | POA: Diagnosis not present

## 2022-08-11 DIAGNOSIS — I5023 Acute on chronic systolic (congestive) heart failure: Secondary | ICD-10-CM | POA: Diagnosis not present

## 2022-08-11 DIAGNOSIS — J9600 Acute respiratory failure, unspecified whether with hypoxia or hypercapnia: Secondary | ICD-10-CM | POA: Diagnosis not present

## 2022-08-11 DIAGNOSIS — I5021 Acute systolic (congestive) heart failure: Secondary | ICD-10-CM | POA: Diagnosis not present

## 2022-08-11 DIAGNOSIS — E039 Hypothyroidism, unspecified: Secondary | ICD-10-CM | POA: Diagnosis not present

## 2022-08-11 DIAGNOSIS — I4892 Unspecified atrial flutter: Secondary | ICD-10-CM | POA: Diagnosis not present

## 2022-08-12 DIAGNOSIS — E039 Hypothyroidism, unspecified: Secondary | ICD-10-CM | POA: Diagnosis not present

## 2022-08-12 DIAGNOSIS — I482 Chronic atrial fibrillation, unspecified: Secondary | ICD-10-CM | POA: Diagnosis not present

## 2022-08-12 DIAGNOSIS — R2689 Other abnormalities of gait and mobility: Secondary | ICD-10-CM | POA: Diagnosis not present

## 2022-08-12 DIAGNOSIS — F419 Anxiety disorder, unspecified: Secondary | ICD-10-CM | POA: Diagnosis not present

## 2022-08-12 DIAGNOSIS — I5023 Acute on chronic systolic (congestive) heart failure: Secondary | ICD-10-CM | POA: Diagnosis not present

## 2022-08-12 DIAGNOSIS — J9621 Acute and chronic respiratory failure with hypoxia: Secondary | ICD-10-CM | POA: Diagnosis not present

## 2022-08-12 DIAGNOSIS — N1832 Chronic kidney disease, stage 3b: Secondary | ICD-10-CM | POA: Diagnosis not present

## 2022-08-25 ENCOUNTER — Encounter: Payer: Self-pay | Admitting: Internal Medicine

## 2022-08-30 DEATH — deceased

## 2022-12-01 IMAGING — MR MR CARD MORPHOLOGY WO/W CM
45 of 48 series · 45 of 48 positions shown · IV contrast (Contrast agent)
Comparison: none

CLINICAL DATA: Clinical question of cardiomyopathy; RVOT Mass
85 year-old Caucasian female

Study assumes HCT of 42
EXAM:
CARDIAC MRI
TECHNIQUE: The patient was scanned on a 1.5 Tesla GE magnet. A dedicated
cardiac coil was used. Functional imaging was done using [employer]
sequences. [DATE], and 4 chamber views were done to assess for RWMA's.
Modified Habumugisha rule using a short axis stack was used to
calculate an ejection fraction on a dedicated work station using
[employer] software. The patient received 10 cc of Gadavist. After 10
minutes inversion recovery sequences were used to assess for
infiltration and scar tissue.
CONTRAST:  10 cc of Gadavist

[Series 4: [id number] · axial · 8.0mm · 1.41mm/px · 1 of 16 slices shown]
[im 1/16]
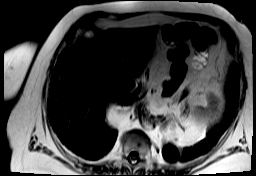

[Series 9: bSSFP · oblique · 8.0mm · 1.61mm/px · 1 of 25 slices shown (1 of 21)]
[im 1/25]
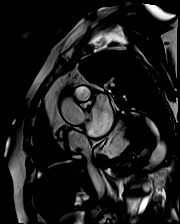

[Series 10: bSSFP · oblique · 8.0mm · 1.61mm/px · 1 of 25 slices shown (2 of 21)]
[im 1/25]
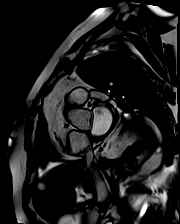

[Series 11: bSSFP · oblique · 8.0mm · 1.61mm/px · 1 of 25 slices shown (3 of 21)]
[im 1/25]
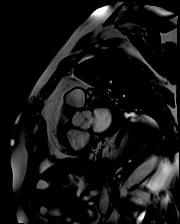

[Series 12: bSSFP · oblique · 8.0mm · 1.61mm/px · 1 of 25 slices shown (4 of 21)]
[im 1/25]
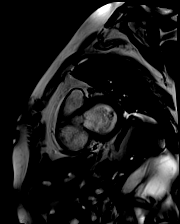

[Series 13: bSSFP · oblique · 8.0mm · 1.61mm/px · 1 of 25 slices shown (5 of 21)]
[im 1/25]
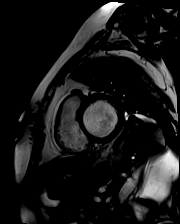

[Series 14: bSSFP · oblique · 8.0mm · 1.61mm/px · 1 of 25 slices shown (6 of 21)]
[im 1/25]
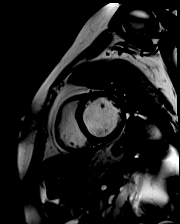

[Series 15: bSSFP · oblique · 8.0mm · 1.61mm/px · 1 of 25 slices shown (7 of 21)]
[im 1/25]
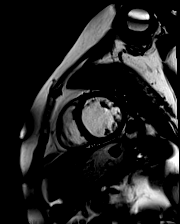

[Series 16: bSSFP · oblique · 8.0mm · 1.61mm/px · 1 of 25 slices shown (8 of 21)]
[im 1/25]
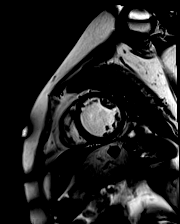

[Series 17: bSSFP · oblique · 8.0mm · 1.61mm/px · 1 of 25 slices shown (9 of 21)]
[im 1/25]
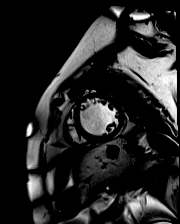

[Series 18: bSSFP · oblique · 8.0mm · 1.61mm/px · 1 of 25 slices shown (10 of 21)]
[im 1/25]
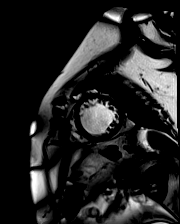

[Series 19: bSSFP · oblique · 8.0mm · 1.61mm/px · 1 of 25 slices shown (11 of 21)]
[im 1/25]
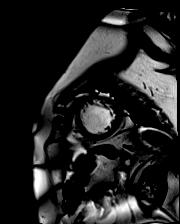

[Series 20: bSSFP · oblique · 8.0mm · 1.61mm/px · 1 of 25 slices shown (12 of 21)]
[im 1/25]
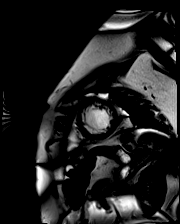

[Series 21: bSSFP · oblique · 8.0mm · 1.61mm/px · 1 of 25 slices shown (13 of 21)]
[im 1/25]
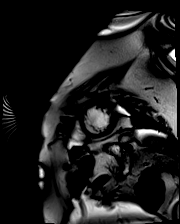

[Series 22: bSSFP · oblique · 8.0mm · 1.61mm/px · 1 of 25 slices shown (14 of 21)]
[im 1/25]
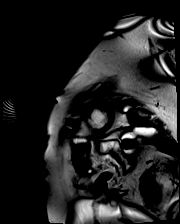

[Series 23: bSSFP · oblique · 8.0mm · 1.61mm/px · 1 of 25 slices shown (15 of 21)]
[im 1/25]
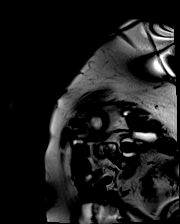

[Series 24: bSSFP · oblique · 8.0mm · 1.61mm/px · 1 of 25 slices shown (16 of 21)]
[im 1/25]
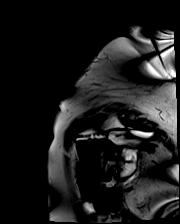

[Series 25: bSSFP · oblique · 8.0mm · 1.61mm/px · 1 of 25 slices shown (17 of 21)]
[im 1/25]
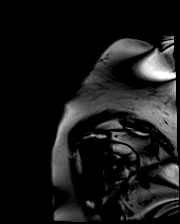

[Series 26: bSSFP · axial · 6.0mm · 1.41mm/px · 1 of 25 slices shown (18 of 21)]
[im 1/25]
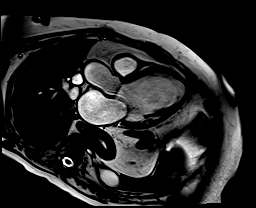

[Series 27: bSSFP · coronal · 6.0mm · 1.41mm/px · 1 of 25 slices shown (19 of 21)]
[im 1/25]
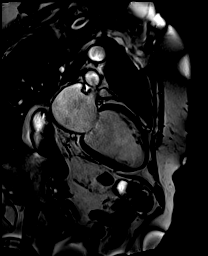

[Series 28: bSSFP · axial · 6.0mm · 1.41mm/px · 1 of 25 slices shown (20 of 21)]
[im 1/25]
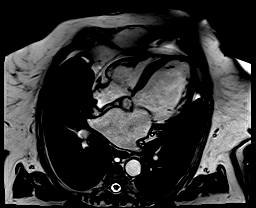

[Series 29: t1_tse_db short axis · axial · 8.0mm · 1.32mm/px · 1 of 16 slices shown]
[im 1/16]
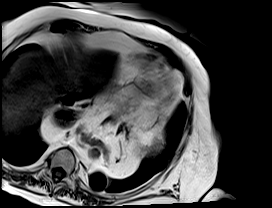

[Series 30: t1_tse_fs db short · axial · 8.0mm · 1.32mm/px · 1 of 16 slices shown]
[im 1/16]
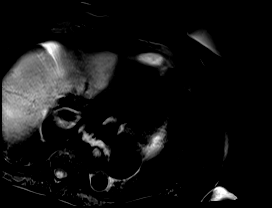

[Series 31: stacl rvot · sagittal · 6.0mm · 1.41mm/px · 1 of 25 slices shown (1 of 13)]
[im 1/25]
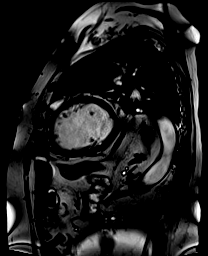

[Series 32: stacl rvot · sagittal · 6.0mm · 1.41mm/px · 1 of 25 slices shown (2 of 13)]
[im 1/25]
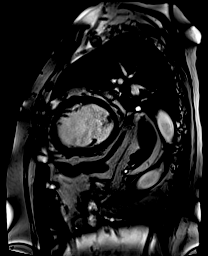

[Series 33: stacl rvot · sagittal · 6.0mm · 1.41mm/px · 1 of 25 slices shown (3 of 13)]
[im 1/25]
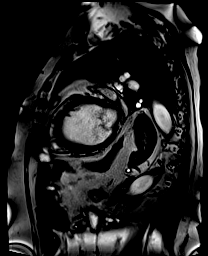

[Series 34: stacl rvot · sagittal · 6.0mm · 1.41mm/px · 1 of 25 slices shown (4 of 13)]
[im 1/25]
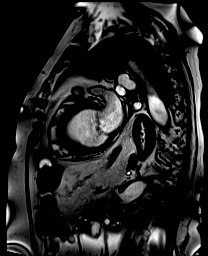

[Series 35: stacl rvot · sagittal · 6.0mm · 1.41mm/px · 1 of 25 slices shown (5 of 13)]
[im 1/25]
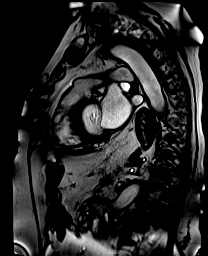

[Series 36: stacl rvot · sagittal · 6.0mm · 1.41mm/px · 1 of 25 slices shown (6 of 13)]
[im 1/25]
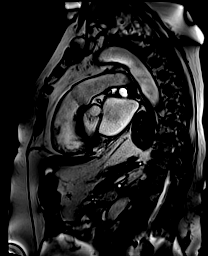

[Series 37: stacl rvot · sagittal · 6.0mm · 1.41mm/px · 1 of 25 slices shown (7 of 13)]
[im 1/25]
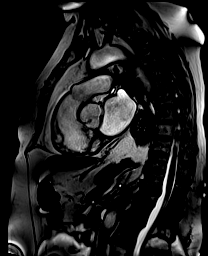

[Series 38: stacl rvot · sagittal · 6.0mm · 1.41mm/px · 1 of 25 slices shown (8 of 13)]
[im 1/25]
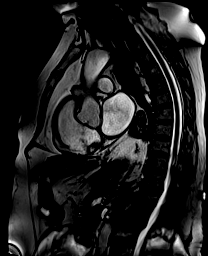

[Series 39: stacl rvot · sagittal · 6.0mm · 1.41mm/px · 1 of 25 slices shown (9 of 13)]
[im 1/25]
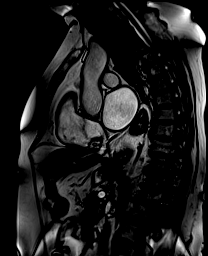

[Series 40: stacl rvot · sagittal · 6.0mm · 1.41mm/px · 1 of 25 slices shown (10 of 13)]
[im 1/25]
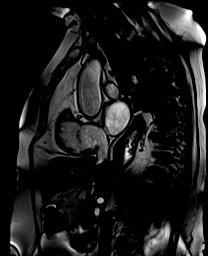

[Series 41: stacl rvot · sagittal · 6.0mm · 1.41mm/px · 1 of 25 slices shown (11 of 13)]
[im 1/25]
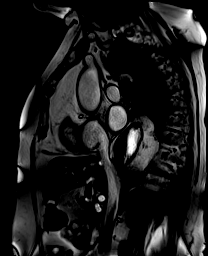

[Series 42: stacl rvot · sagittal · 6.0mm · 1.41mm/px · 1 of 25 slices shown (12 of 13)]
[im 1/25]
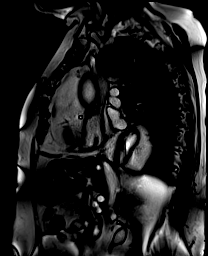

[Series 43: stacl rvot · sagittal · 6.0mm · 1.41mm/px · 1 of 25 slices shown (13 of 13)]
[im 1/25]
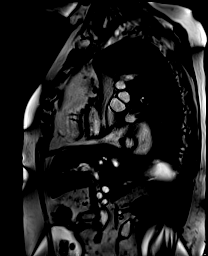

[Series 44: bSSFP · coronal · 6.0mm · 1.41mm/px · 1 of 25 slices shown (21 of 21)]
[im 1/25]
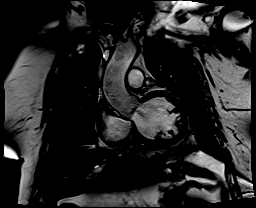

[Series 45: aortic valve cine · sagittal · 6.0mm · 1.41mm/px · 1 of 25 slices shown (1 of 8)]
[im 1/25]
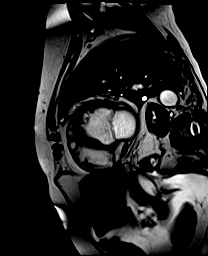

[Series 46: aortic valve cine · sagittal · 6.0mm · 1.41mm/px · 1 of 25 slices shown (2 of 8)]
[im 1/25]
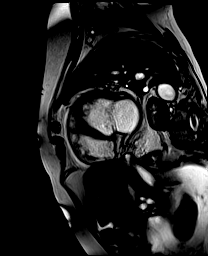

[Series 47: aortic valve cine · sagittal · 6.0mm · 1.41mm/px · 1 of 25 slices shown (3 of 8)]
[im 1/25]
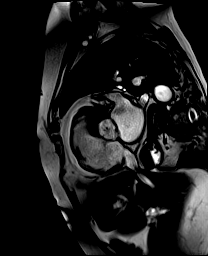

[Series 48: aortic valve cine · sagittal · 6.0mm · 1.41mm/px · 1 of 25 slices shown (4 of 8)]
[im 1/25]
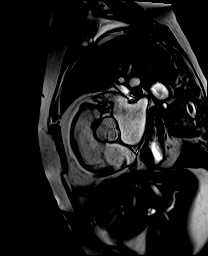

[Series 49: aortic valve cine · sagittal · 6.0mm · 1.41mm/px · 1 of 25 slices shown (5 of 8)]
[im 1/25]
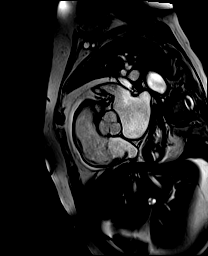

[Series 50: aortic valve cine · sagittal · 6.0mm · 1.41mm/px · 1 of 25 slices shown (6 of 8)]
[im 1/25]
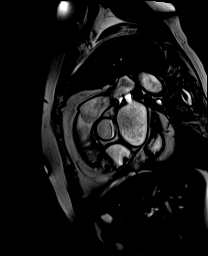

[Series 51: aortic valve cine · sagittal · 6.0mm · 1.41mm/px · 1 of 25 slices shown (7 of 8)]
[im 1/25]
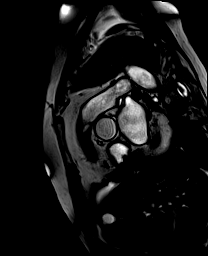

[Series 52: aortic valve cine · sagittal · 6.0mm · 1.41mm/px · 1 of 25 slices shown (8 of 8)]
[im 1/25]
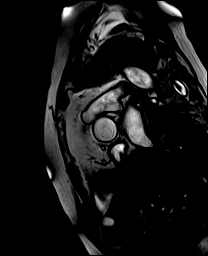

[45 of 48 positions shown; findings below may reference images not displayed]

FINDINGS: 1. Severely dilated left ventricular size, with LVEDD 63 mm, but
LVEDVi 128 mL/m2.

Thin left ventricle with intraventricular septal thickness of 6 mm
and posterior wall thickness of 7 mm.

Severe left ventricular systolic dysfunction (LVEF =20 %). There are
no regional wall motion abnormalities- there is global hypokinesis.

Left ventricular parametric mapping notable for Normal T2 signal.
Suboptimal native T1 acquisition, though normal signal in base.

There is no late gadolinium enhancement in the left ventricular
myocardium.

No LV Thrombus detected.

2. Normal right ventricular size with RVEDVI 69 mL/m2.

Normal right ventricular thickness.

Mildly decreased right ventricular systolic function (RVEF =39 %).
There are no regional wall motion abnormalities or aneurysms;
qualitatively appears low normal.

RVOT mass perfuses with myocardium.

3. Severe left atrial dilation and right atrial size, with LAESV 63
mL/m2 and RAESV 21 mL/m2.

4. Normal size of the aortic root, ascending aorta and pulmonary
artery.

5. Mitral Valve: Qualitatively mitral regurgitation is mild and
central.

Aortic Valve: Aortic regurgitation is mild and central;
regurgitation fraction 5%.

No other significant valvular pathology.

6.  Normal pericardium.  No pericardial effusion.

7. Grossly, no extracardiac findings. Recommended dedicated study if
concerned for non-cardiac pathology.
IMPRESSION: Severe LV dilation with severe decrease in LV function without
evidence of LGE.

RVOT mass seen in echocardiogram likely represents trabeculation of
the RV myocardium seen under the pulmonic valve, not suggestive of
mass.

Severe left atrial dilation.
